# Patient Record
Sex: Male | Born: 1950 | Race: White | Hispanic: No | Marital: Single | Smoking: Former smoker
Health system: Southern US, Community
[De-identification: ages and names within clinical notes are randomized; demographics above are authoritative.]

## PROBLEM LIST (undated history)

## (undated) DIAGNOSIS — I2510 Atherosclerotic heart disease of native coronary artery without angina pectoris: Secondary | ICD-10-CM

## (undated) DIAGNOSIS — E669 Obesity, unspecified: Secondary | ICD-10-CM

## (undated) DIAGNOSIS — M1990 Unspecified osteoarthritis, unspecified site: Secondary | ICD-10-CM

## (undated) DIAGNOSIS — E785 Hyperlipidemia, unspecified: Secondary | ICD-10-CM

## (undated) DIAGNOSIS — G4730 Sleep apnea, unspecified: Secondary | ICD-10-CM

## (undated) DIAGNOSIS — R7303 Prediabetes: Secondary | ICD-10-CM

## (undated) DIAGNOSIS — M109 Gout, unspecified: Secondary | ICD-10-CM

## (undated) DIAGNOSIS — N200 Calculus of kidney: Secondary | ICD-10-CM

## (undated) DIAGNOSIS — R079 Chest pain, unspecified: Secondary | ICD-10-CM

## (undated) DIAGNOSIS — I10 Essential (primary) hypertension: Secondary | ICD-10-CM

## (undated) DIAGNOSIS — M5412 Radiculopathy, cervical region: Secondary | ICD-10-CM

## (undated) DIAGNOSIS — F32A Depression, unspecified: Secondary | ICD-10-CM

## (undated) DIAGNOSIS — K219 Gastro-esophageal reflux disease without esophagitis: Secondary | ICD-10-CM

## (undated) DIAGNOSIS — R42 Dizziness and giddiness: Secondary | ICD-10-CM

## (undated) DIAGNOSIS — F411 Generalized anxiety disorder: Secondary | ICD-10-CM

## (undated) DIAGNOSIS — D239 Other benign neoplasm of skin, unspecified: Secondary | ICD-10-CM

## (undated) DIAGNOSIS — I219 Acute myocardial infarction, unspecified: Secondary | ICD-10-CM

## (undated) DIAGNOSIS — F329 Major depressive disorder, single episode, unspecified: Secondary | ICD-10-CM

## (undated) DIAGNOSIS — IMO0001 Reserved for inherently not codable concepts without codable children: Secondary | ICD-10-CM

## (undated) HISTORY — DX: Prediabetes: R73.03

## (undated) HISTORY — DX: Gastro-esophageal reflux disease without esophagitis: K21.9

## (undated) HISTORY — DX: Gout, unspecified: M10.9

## (undated) HISTORY — DX: Depression, unspecified: F32.A

## (undated) HISTORY — PX: UPPER GI ENDOSCOPY: SHX6162

## (undated) HISTORY — DX: Chest pain, unspecified: R07.9

## (undated) HISTORY — PX: TONSILLECTOMY: SUR1361

## (undated) HISTORY — PX: CORONARY STENT PLACEMENT: SHX1402

## (undated) HISTORY — DX: Calculus of kidney: N20.0

## (undated) HISTORY — DX: Other benign neoplasm of skin, unspecified: D23.9

## (undated) HISTORY — DX: Hyperlipidemia, unspecified: E78.5

## (undated) HISTORY — PX: APPENDECTOMY: SHX54

## (undated) HISTORY — PX: EYE SURGERY: SHX253

## (undated) HISTORY — DX: Generalized anxiety disorder: F41.1

## (undated) HISTORY — DX: Radiculopathy, cervical region: M54.12

## (undated) HISTORY — DX: Obesity, unspecified: E66.9

## (undated) HISTORY — DX: Reserved for inherently not codable concepts without codable children: IMO0001

## (undated) HISTORY — PX: ANTERIOR CRUCIATE LIGAMENT REPAIR: SHX115

## (undated) HISTORY — DX: Atherosclerotic heart disease of native coronary artery without angina pectoris: I25.10

## (undated) HISTORY — PX: CHOLECYSTECTOMY: SHX55

## (undated) HISTORY — DX: Acute myocardial infarction, unspecified: I21.9

## (undated) HISTORY — DX: Major depressive disorder, single episode, unspecified: F32.9

---

## 2004-12-04 ENCOUNTER — Ambulatory Visit: Payer: Self-pay | Admitting: Unknown Physician Specialty

## 2005-10-16 ENCOUNTER — Ambulatory Visit: Payer: Self-pay | Admitting: General Practice

## 2006-07-11 ENCOUNTER — Ambulatory Visit: Payer: Self-pay | Admitting: Gastroenterology

## 2006-11-14 ENCOUNTER — Ambulatory Visit (HOSPITAL_BASED_OUTPATIENT_CLINIC_OR_DEPARTMENT_OTHER): Admission: RE | Admit: 2006-11-14 | Discharge: 2006-11-14 | Payer: Self-pay | Admitting: Orthopedic Surgery

## 2007-04-11 ENCOUNTER — Other Ambulatory Visit: Payer: Self-pay

## 2007-04-11 ENCOUNTER — Emergency Department: Payer: Self-pay | Admitting: Emergency Medicine

## 2007-04-28 ENCOUNTER — Ambulatory Visit: Payer: Self-pay | Admitting: Cardiology

## 2007-05-03 ENCOUNTER — Ambulatory Visit: Payer: Self-pay

## 2007-05-03 LAB — CONVERTED CEMR LAB
Cholesterol: 187 mg/dL (ref 0–200)
Direct LDL: 127 mg/dL
HDL: 37.1 mg/dL — ABNORMAL LOW (ref >39.0)
Total CHOL/HDL Ratio: 5
Triglycerides: 202 mg/dL (ref 0–149)
VLDL: 40 mg/dL (ref 0–40)

## 2009-06-19 DIAGNOSIS — R079 Chest pain, unspecified: Secondary | ICD-10-CM | POA: Insufficient documentation

## 2011-03-02 NOTE — Assessment & Plan Note (Signed)
[facility] OFFICE NOTE   ZI, NEWBURY                      MRN:          [id number]  DATE:04/28/2007                            DOB:          1950-11-29    PRIMARY CARE PHYSICIAN:  Dr. Julieanne Manson.   REASON FOR PRESENTATION:  Evaluate patient with chest discomfort.   HISTORY OF PRESENT ILLNESS:  Patient is a pleasant 60 year old gentleman  without prior cardiac history.  He said that some years ago, he did have  a stress test and describes perhaps a nuclear study.  He had no apparent  coronary disease; however, he has a significant family history with his  father dying in his 57s suddenly of an acute myocardial infarction.  He  has had chest discomfort.  This gentleman has quite pressured speech and  appears anxious.  He has a difficult time slowing down to quantify and  qualify the discomfort.  He describes chest discomfort with burping.  He  says it is a constant problem when he sits down.  He will feel the  discomfort in his chest, and then he has to burp.  This has been going  on for several weeks.  He has also had some left shoulder discomfort.  This has been the last couple of days.  It has been when he wakes up in  the morning.  This morning, he even noticed his arm to be numb when he  woke up.  He says he walks a mile a day.  He cannot bring on any of his  symptoms with this.  He does not report any shortness of breath unless  he walks fairly vigorously.  He does not have any resting shortness of  breath.  He has no PND or orthopnea.  With this chest discomfort, he  does not describe radiation to his jaw.  It goes away after he burps.  He cannot bring it on.  He has not had this before.  There is no  associated nausea, vomiting or diaphoresis.  He is not noticing any  palpitations.  He has had no presyncope or syncope.  He did go to the  [facility] emergency room on June 25 but was not found to  have any  objective evidence of ischemia.   PAST MEDICAL HISTORY:  He has no history of hypertension, diabetes, or  hyperlipidemia.  He does have a history of reflux.   PAST SURGICAL HISTORY:  Cholecystectomy, appendectomy, tonsillectomy,  ACL knee surgery.   ALLERGIES:  None.   MEDICATIONS:  1. AcipHex.  2. Aspirin 162 mg daily.  3. Centrum.   SOCIAL HISTORY:  Patient is divorced.  He has a long-time girlfriend.  He has a son.  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  He  quit smoking in 2004 after about a pack a day for 15 years.  He still  dips tobacco.   FAMILY HISTORY:  As mentioned, with his father.  There are no other  first-degree relatives with early-onset heart disease.   REVIEW OF SYSTEMS:  As stated in the HPI and negative for other systems.   PHYSICAL EXAMINATION:  GENERAL:  The patient appears somewhat anxious  and high-pressured but in no distress.  VITAL SIGNS:  Blood pressure 132/82, heart rate 74 and regular.  Weight  192 pounds.  HEENT:  Eyelids unremarkable.  Pupils are equal, round and reactive to  light.  Fundi not visualized.  Oral mucosa unremarkable.  NECK:  No jugular venous distention.  Wave form within normal limits.  Carotid upstroke brisk and symmetric.  No bruits, no thyromegaly.  LYMPHATICS:  No cervical, axillary, or inguinal adenopathy.  LUNGS:  Clear to auscultation bilaterally.  BACK:  No costovertebral angle tenderness.  CHEST:  Unremarkable.  HEART:  PMI not displaced or sustained.  S1 and S2 within normal limits.  No S3, no S4, no clicks, rubs, or murmurs.  ABDOMEN:  Obese, positive bowel sounds.  Normal in frequency and pitch.  No bruits, rebound, guarding.  There are no midline pulsatile masses.  No hepatomegaly, splenomegaly.  SKIN:  No rashes, no nodules.  EXTREMITIES:  Pulses 2+ throughout.  No cyanosis, no clubbing, no edema.  NEUROLOGIC:  Oriented to person, place, and time.  Cranial nerves II-XII  grossly  intact.  Motor grossly intact.   EKG:  ([facility]).  Sinus rhythm, rate 91, axis leftward, intervals  within normal limits, no acute ST-T wave changes.   ASSESSMENT/PLAN:  1. Chest discomfort:  The patient's chest discomfort is atypical;      however, he does have a very strong family history with his father      dying at a young age suddenly of coronary disease.  Given this and      his somewhat atypical symptoms, I still think the pretest      probability of obstructive coronary disease is low moderate.  I      think he would benefit from a stress perfusion study to rule out      obstructive disease.  However, if this is normal, I told him to      assume that he has some nonobstructive plaque, given his risk      factors and lifestyle.  We are going to work very aggressively on      risk reduction.  2. Obesity:  I discussed with him the need to exercise with diet and      weight loss.  He will embark on this once he is cleared from his      stress test.  3. Risk reduction:  He needs a lipid profile.  Given his family      history, I would work outside the guidelines and suggest an LDL      less than 100 and HDL in the 40s and have a low threshold for a      statin.  4. Tobacco:  I did counsel him about the need to stop dipping tobacco.      He could use nicotine patches if he is cleared from his stress      test.  5. Followup:  On an as-needed basis on the results as above.  We will      review with him the results of his cholesterol.     Rollene Rotunda, MD, [facility]  Electronically Signed    JH/MedQ  DD: 04/28/2007  DT: 04/29/2007  Job #: [id number]   cc:   Julieanne Manson

## 2011-03-05 NOTE — Op Note (Signed)
NAME:  Michael Leon, Michael Leon               ACCOUNT NO.:  [id number]   MEDICAL RECORD NO.:  [id number]          PATIENT TYPE:  AMB   LOCATION:  DSC                          FACILITY:  [facility]   PHYSICIAN:  Feliberto Gottron. Turner Daniels, M.D.   DATE OF BIRTH:  14-Sep-1951   DATE OF PROCEDURE:  11/14/2006  DATE OF DISCHARGE:                               OPERATIVE REPORT   PREOPERATIVE DIAGNOSIS:  Right knee possible anterior cruciate ligament  tear, chondromalacia versus meniscal tear.   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  The menisci were in excellent  condition as was the rest of the articular cartilage.   PROCEDURE:  Right knee arthroscopic debridement of chondromalacia from  the end of the femur, grade 3 with flap tears.  The anterior 50% of the  anterior cruciate ligament graft was also torn and removed.  The rest of  the graft was intact although somewhat loose.  Preoperatively the  patient elected not to have any sort of an anterior cruciate ligament  reconstruction and the laxity of the graft at the time of surgery was  probably between 3 and 4 mm.   PROCEDURE:  Debridement of anterior cruciate ligament tear, debridement  of chondromalacia from the medial femoral condyle.   SURGEON:  Feliberto Gottron. Turner Daniels, M.D.   FIRST ASSISTANT:  Skip Mayer, [facility].   ANESTHETIC:  General LMA.   ESTIMATED BLOOD LOSS:  Minimal.   FLUID REPLACEMENT:  800 mL crystalloid.   DRAINS PLACED:  None.   TOURNIQUET TIME:  None.   INDICATIONS FOR PROCEDURE:  60 year old man with catching, popping and  pain in this knee.  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  Because of the metal fixation used for the ACL graft,  an MRI scan was not able to be accomplished.  He has mechanical pain,  popping, and catching in his knee as well as some instability.  Lachman's test in the office was about 1+.  Pivot glide is present, but  pivot shift is equivocal.  In any event because of persistent pain,  catching and popping and pain and the nature of his work, he was taken  for arthroscopic evaluation and treatment of his right knee.  He is 60  years old.  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  At this point time he does not want to have an ACL  reconstruction if the graft was completely torn.  Risks and benefits of  surgery discussed, questions answered.   DESCRIPTION OF PROCEDURE:  The patient identified by armband, taken to  the operating room at [facility] day surgery center.  Appropriate site  monitors were attached and general LMA anesthesia  induced with the  patient in supine position.  Lateral post applied to the table.  The  right lower extremity prepped, draped usual sterile fashion from the  ankle to the midthigh.  Using a #11 blade, standard inferomedial and  inferolateral peripatellar portals made allowing the introduction of the  arthroscope through the inferolateral portal and the outflow through the  inferomedial portal.  Diagnostic arthroscopy revealed some very mild  chondromalacia of the patella that was very lightly debrided,  Grade 3  chondromalacia focal of the trochlea over 4 to 5 mm area was also  debrided as was some larger flap tears along the distal lateral aspect  of the medial femoral condyle.  The menisci were intact.  The ACL graft  was examined and the anterior one half of the graft was avulsed off of  the femur and this was debrided from anterior to posterior back to some  stable posterior fibers comprising about 40% of the graft that were  intact although on Lachman's testing, there was about  3-4 mm of  movement.  The lateral meniscus was intact.  The collateral ligaments  were intact to varus and valgus testing.  The knee was irrigated out  with normal saline solution.  The arthroscopic instruments were removed  and a dressing of Xeroform, 4x4 dressing sponges, Webril and Ace wrap  applied.  The patient was then awakened and taken to recovery room  without difficulty.      Feliberto Gottron. Turner Daniels, M.D.  Electronically Signed     FJR/MEDQ  D:  11/14/2006  T:  11/14/2006  Job:  [id number]

## 2011-03-05 NOTE — Op Note (Signed)
NAME:  Michael Leon, Michael Leon               ACCOUNT NO.:  [id number]   MEDICAL RECORD NO.:  [id number]          PATIENT TYPE:  AMB   LOCATION:  DSC                          FACILITY:  [facility]   PHYSICIAN:  Feliberto Gottron. Turner Daniels, M.D.   DATE OF BIRTH:  March 31, 1951   DATE OF PROCEDURE:  11/14/2006  DATE OF DISCHARGE:                               OPERATIVE REPORT      Feliberto Gottron. Turner Daniels, M.D.     Ovid Curd  D:  11/14/2006  T:  11/14/2006  Job:  [id number]

## 2011-03-26 ENCOUNTER — Encounter: Payer: Self-pay | Admitting: Cardiovascular Disease

## 2012-03-28 ENCOUNTER — Ambulatory Visit: Payer: Self-pay | Admitting: Internal Medicine

## 2012-03-29 LAB — BASIC METABOLIC PANEL
Anion Gap: 11 (ref 7–16)
BUN: 20 mg/dL — ABNORMAL HIGH (ref 7–18)
Calcium, Total: 9.5 mg/dL (ref 8.5–10.1)
Chloride: 102 mmol/L (ref 98–107)
Co2: 24 mmol/L (ref 21–32)
Creatinine: 1.17 mg/dL (ref 0.60–1.30)
EGFR (African American): >60
EGFR (Non-African Amer.): >60
Glucose: 173 mg/dL — ABNORMAL HIGH (ref 65–99)
Osmolality: 281 (ref 275–301)
Potassium: 4.7 mmol/L (ref 3.5–5.1)
Sodium: 137 mmol/L (ref 136–145)

## 2012-03-29 LAB — CK TOTAL AND CKMB (NOT AT ARMC)
CK, Total: 109 U/L (ref 35–232)
CK-MB: 0.9 ng/mL (ref 0.5–3.6)

## 2012-12-13 ENCOUNTER — Ambulatory Visit: Payer: Self-pay | Admitting: Family Medicine

## 2013-02-20 LAB — HM HEPATITIS C SCREENING LAB: HM Hepatitis Screen: NEGATIVE

## 2013-09-27 ENCOUNTER — Ambulatory Visit: Payer: Self-pay | Admitting: Family Medicine

## 2013-12-25 LAB — HM COLONOSCOPY

## 2014-04-24 LAB — HEPATIC FUNCTION PANEL: Bilirubin, Total: 0.7 mg/dL

## 2014-04-24 LAB — PSA: PSA: 1.3

## 2014-04-24 LAB — LIPID PANEL
Cholesterol: 216 mg/dL — AB (ref 0–200)
HDL: 41 mg/dL (ref 35–70)
LDL Cholesterol: 123 mg/dL
LDl/HDL Ratio: 3
Triglycerides: 261 mg/dL — AB (ref 40–160)

## 2014-04-24 LAB — BASIC METABOLIC PANEL
BUN: 12 mg/dL (ref 4–21)
Creatinine: 1.1 mg/dL (ref 0.6–1.3)
Glucose: 113 mg/dL
Sodium: 143 mmol/L (ref 137–147)

## 2014-04-24 LAB — TSH: TSH: 0.71 u[IU]/mL (ref 0.41–5.90)

## 2014-10-18 DIAGNOSIS — I213 ST elevation (STEMI) myocardial infarction of unspecified site: Secondary | ICD-10-CM

## 2014-10-18 HISTORY — DX: ST elevation (STEMI) myocardial infarction of unspecified site: I21.3

## 2014-10-18 HISTORY — PX: CARDIAC CATHETERIZATION: SHX172

## 2014-10-22 LAB — HEMOGLOBIN A1C: Hgb A1c MFr Bld: 5.9 % (ref 4.0–6.0)

## 2015-02-09 NOTE — Discharge Summary (Signed)
PATIENT NAME:  Michael Leon, TALLERICO MR#:  [id number] DATE OF BIRTH:  [birth year]  DATE OF ADMISSION:  03/28/2012 DATE OF DISCHARGE:  03/29/2012  PRIMARY CARE PHYSICIAN: Dr. Miguel Aschoff   DISCHARGE DIAGNOSES:  1. Stable angina.  2. Coronary artery disease.  3. Hypertension.  4. Hyperlipidemia.   PROCEDURES: Patient underwent a cardiac catheterization showing moderate three vessel coronary atherosclerosis with critical diagonal artery atherosclerosis of 95% therefore underwent a PCI and stent placement using a drug-eluting stent without complication.   HISTORY: This 64 year old male with hypertension, hyperlipidemia, known coronary artery disease having progressive episodes of chest discomfort and true angina needing further evaluation. Patient underwent cardiac catheterization showing results as above therefore PCI and stent placement was performed with a drug-eluting stent and the patient did fairly well without evidence of further chest discomfort. The patient was placed on appropriate medications and had no other further symptoms or complications. The patient had an EKG showing normal sinus rhythm, normal EKG.   DISCHARGE MEDICATIONS:  1. Zantac 150 mg a day. 2. Pravastatin 40 mg p.o. daily.  3. Losartan 50 mg p.o. daily.  4. Aspirin 81 mg each day. 5. Plavix 75 mg p.o. daily.   FOLLOW UP: He is to follow up in two weeks and have further adjustments of medications.  ____________________________ Corey Skains, MD bjk:cms D: 03/29/2012 08:19:00 ET T: 03/29/2012 11:09:25 ET  JOB#: [id number] Corey Skains MD ELECTRONICALLY SIGNED 03/30/2012 7:49

## 2015-04-29 ENCOUNTER — Encounter: Payer: Self-pay | Admitting: Emergency Medicine

## 2015-04-29 ENCOUNTER — Other Ambulatory Visit: Payer: Self-pay

## 2015-04-29 ENCOUNTER — Emergency Department
Admission: EM | Admit: 2015-04-29 | Discharge: 2015-04-29 | Payer: Managed Care, Other (non HMO) | Attending: Emergency Medicine | Admitting: Emergency Medicine

## 2015-04-29 ENCOUNTER — Emergency Department: Payer: Managed Care, Other (non HMO)

## 2015-04-29 DIAGNOSIS — R110 Nausea: Secondary | ICD-10-CM | POA: Insufficient documentation

## 2015-04-29 DIAGNOSIS — I2119 ST elevation (STEMI) myocardial infarction involving other coronary artery of inferior wall: Secondary | ICD-10-CM | POA: Diagnosis not present

## 2015-04-29 DIAGNOSIS — I10 Essential (primary) hypertension: Secondary | ICD-10-CM | POA: Diagnosis not present

## 2015-04-29 DIAGNOSIS — R079 Chest pain, unspecified: Secondary | ICD-10-CM | POA: Diagnosis present

## 2015-04-29 DIAGNOSIS — Z87891 Personal history of nicotine dependence: Secondary | ICD-10-CM | POA: Diagnosis not present

## 2015-04-29 DIAGNOSIS — R61 Generalized hyperhidrosis: Secondary | ICD-10-CM | POA: Insufficient documentation

## 2015-04-29 LAB — CBC
HCT: 50.2 % (ref 40.0–52.0)
Hemoglobin: 17.1 g/dL (ref 13.0–18.0)
MCH: 32.2 pg (ref 26.0–34.0)
MCHC: 34.1 g/dL (ref 32.0–36.0)
MCV: 94.4 fL (ref 80.0–100.0)
Platelets: 232 10*3/uL (ref 150–440)
RBC: 5.32 MIL/uL (ref 4.40–5.90)
RDW: 14.5 % (ref 11.5–14.5)
WBC: 12.6 10*3/uL — ABNORMAL HIGH (ref 3.8–10.6)

## 2015-04-29 LAB — APTT: aPTT: 28 seconds (ref 24–36)

## 2015-04-29 LAB — PROTIME-INR
INR: 0.91
Prothrombin Time: 12.5 seconds (ref 11.4–15.0)

## 2015-04-29 MED ORDER — HEPARIN (PORCINE) IN NACL 100-0.45 UNIT/ML-% IJ SOLN
1100.0000 [IU]/h | Freq: Once | INTRAMUSCULAR | Status: DC
  Filled 2015-04-29: qty 250

## 2015-04-29 MED ORDER — SODIUM CHLORIDE 0.9 % IV SOLN
INTRAVENOUS | Status: DC
  Administered 2015-04-29: 20:00:00 via INTRAVENOUS

## 2015-04-29 MED ORDER — HEPARIN SODIUM (PORCINE) 5000 UNIT/ML IJ SOLN
60.0000 [IU]/kg | INTRAMUSCULAR | Status: AC
  Administered 2015-04-29: 5450 [IU] via INTRAVENOUS

## 2015-04-29 MED ORDER — CLOPIDOGREL BISULFATE 75 MG PO TABS
600.0000 mg | ORAL_TABLET | Freq: Once | ORAL | Status: AC
  Administered 2015-04-29: 600 mg via ORAL

## 2015-04-29 NOTE — ED Provider Notes (Signed)
[facility] Emergency Department Provider Note  Time seen: 8:16 PM  I have reviewed the triage vital signs and the nursing notes.   HISTORY  Chief Complaint Chest Pain and Jaw Pain    HPI Michael Leon is a 64 y.o. male with a past medical history of hypertension, hyperlipidemia, coronary stent 4 years ago, presents the emergency department with approximately one hour of chest pain radiating to the jaw. According to the patient he was weed eating his lawn for approximately one hour, began having central chest pain radiating to his jaw. Rates the pain as a 4/10 at maximum. Became somewhat nauseated and diaphoretic so he drove himself to the emergency department after taking 2 full strength aspirin at home. Upon arrival to the emergency department an EKG was done which showed a likely inferolateral STEMI. Patient states his chest pain is currently a 1/10, remains pale and diaphoretic. Describes the pain as a pressure sensation in his central chest radiating to bilateral jaws.     Past Medical History  Diagnosis Date  . Chest pain, unspecified   . Reflux     Patient Active Problem List   Diagnosis Date Noted  . CHEST PAIN UNSPECIFIED 06/19/2009    Past Surgical History  Procedure Laterality Date  . Cholecystectomy    . Appendectomy    . Tonsillectomy    . Anterior cruciate ligament repair    . Coronary stent placement      No current outpatient prescriptions on file.  Allergies Review of patient's allergies indicates no known allergies.  Family History  Problem Relation Age of Onset  . Cancer Mother   . Heart attack Father     Social History History  Substance Use Topics  . Smoking status: Former Smoker -- 15 years    Quit date: 10/18/2002  . Smokeless tobacco: Not on file  . Alcohol Use: 3.5 oz/week    7 Standard drinks or equivalent per week     Comment: 1 beer a day    Review of Systems Constitutional: Negative for  fever. Cardiovascular: Positive for chest pain radiating to the jaw. Respiratory: Negative for shortness of breath. Gastrointestinal: Negative for abdominal pain, vomiting and diarrhea. Positive for nausea. Musculoskeletal: Negative for back pain. Skin: Positive for pallor and diaphoresis.  10-point ROS otherwise negative.  ____________________________________________   PHYSICAL EXAM:  VITAL SIGNS: ED Triage Vitals  Enc Vitals Group     BP 04/29/15 2004 179/94 mmHg     Pulse Rate 04/29/15 2004 98     Resp 04/29/15 2004 11     Temp 04/29/15 2004 98 F (36.7 C)     Temp Source 04/29/15 2004 Oral     SpO2 04/29/15 2004 100 %     Weight 04/29/15 2004 200 lb (90.719 kg)     Height 04/29/15 2004 5\' 4"  (1.626 m)     Head Cir --      Peak Flow --      Pain Score 04/29/15 2004 1     Pain Loc --      Pain Edu? --      Excl. in [place]? --     Constitutional: Alert and oriented. No distress. Eyes: Normal exam ENT   Mouth/Throat: Mucous membranes are moist. Cardiovascular: Normal rate, regular rhythm. No murmur Respiratory: Normal respiratory effort without tachypnea nor retractions. Breath sounds are clear and equal bilaterally. No wheezes/rales/rhonchi. Gastrointestinal: Soft and nontender. No distention.   Musculoskeletal: Nontender with normal range of  motion in all extremities. No lower extremity tenderness or edema. Neurologic:  Normal speech and language. No gross focal neurologic deficits  Skin:  Skin is somewhat pale and diaphoretic. Psychiatric: Mood and affect are normal. Speech and behavior are normal. Patient exhibits appropriate insight and judgment.  ____________________________________________    EKG  EKG reviewed and interpreted by myself shows normal sinus rhythm at 89 bpm, narrow QRS, left axis deviation, ST elevation present in 2, 3, aVF, V4 through V6. Most consistent with an inferolateral STEMI.  ____________________________________________     RADIOLOGY  Chest x-ray within normal limits  ____________________________________________   INITIAL IMPRESSION / ASSESSMENT AND PLAN / ED COURSE  Pertinent labs & imaging results that were available during my care of the patient were reviewed by me and considered in my medical decision making (see chart for details).  Patient presents with chest pain radiating to the jaw, and an EKG consistent with an acute inferolateral STEMI. Patient took 2 full strength aspirin prior to arrival. We will start the patient on 600 mg of Plavix, heparin bolus and drip. I discussed with [facility] who has accepted the patient directly to the catheter lab. Due to bad weather their helicopter as well as [facility]'s helicopter are not currently flying. We have attempted to arrange transport from local EMS however they are not able to transport with a heparin drip. [facility] we'll be sending their truck from [place] to pick the patient up and bring to [facility]. Patient with no distress at this time, currently states 1/10 chest pain. Vitals otherwise stable.   CRITICAL CARE Performed by: Harvest Dark   Total critical care time: 45 minutes  Critical care time was exclusive of separately billable procedures and treating other patients.  Critical care was necessary to treat or prevent imminent or life-threatening deterioration.  Critical care was time spent personally by me on the following activities: development of treatment plan with patient and/or surrogate as well as nursing, discussions with consultants, evaluation of patient's response to treatment, examination of patient, obtaining history from patient or surrogate, ordering and performing treatments and interventions, ordering and review of laboratory studies, ordering and review of radiographic studies, pulse oximetry and re-evaluation of patient's condition.  Patient reevaluated just prior to transfer, remained hemodynamically stable, well appearing with a 1/10 chest  discomfort.   ____________________________________________   FINAL CLINICAL IMPRESSION(S) / ED DIAGNOSES  Inferolateral STEMI   Harvest Dark, MD 04/30/15 2033

## 2015-04-29 NOTE — ED Notes (Signed)
Pt presents to ED with midsternal chest pain that radiates to his jaw. Cardiac hx of stent placement. Diaphoretic.

## 2015-04-29 NOTE — ED Notes (Signed)
[facility] states [facility] not using heparin drip for STEMI so heparin drip not started

## 2015-04-30 DIAGNOSIS — F419 Anxiety disorder, unspecified: Secondary | ICD-10-CM | POA: Insufficient documentation

## 2015-04-30 DIAGNOSIS — I213 ST elevation (STEMI) myocardial infarction of unspecified site: Secondary | ICD-10-CM | POA: Insufficient documentation

## 2015-04-30 DIAGNOSIS — R739 Hyperglycemia, unspecified: Secondary | ICD-10-CM | POA: Insufficient documentation

## 2015-04-30 DIAGNOSIS — R7303 Prediabetes: Secondary | ICD-10-CM | POA: Insufficient documentation

## 2015-05-01 LAB — COMPREHENSIVE METABOLIC PANEL
ALT: 39 U/L (ref 17–63)
AST: 40 U/L (ref 15–41)
Albumin: 4.5 g/dL (ref 3.5–5.0)
Alkaline Phosphatase: 51 U/L (ref 38–126)
Anion gap: 14 (ref 5–15)
BUN: 13 mg/dL (ref 6–20)
CO2: 23 mmol/L (ref 22–32)
Calcium: 10.4 mg/dL — ABNORMAL HIGH (ref 8.9–10.3)
Chloride: 100 mmol/L — ABNORMAL LOW (ref 101–111)
Creatinine, Ser: 1.16 mg/dL (ref 0.61–1.24)
GFR calc Af Amer: >60 mL/min (ref >60)
GFR calc non Af Amer: >60 mL/min (ref >60)
Glucose, Bld: 107 mg/dL — ABNORMAL HIGH (ref 65–99)
Potassium: 3.3 mmol/L — ABNORMAL LOW (ref 3.5–5.1)
Sodium: 137 mmol/L (ref 135–145)
Total Bilirubin: 0.6 mg/dL (ref 0.3–1.2)
Total Protein: 7.8 g/dL (ref 6.5–8.1)

## 2015-05-01 LAB — TROPONIN I: Troponin I: 0.03 ng/mL (ref <0.031)

## 2015-05-05 DIAGNOSIS — I10 Essential (primary) hypertension: Secondary | ICD-10-CM | POA: Insufficient documentation

## 2015-05-06 DIAGNOSIS — G4700 Insomnia, unspecified: Secondary | ICD-10-CM

## 2015-05-06 DIAGNOSIS — F329 Major depressive disorder, single episode, unspecified: Secondary | ICD-10-CM

## 2015-05-06 DIAGNOSIS — E669 Obesity, unspecified: Secondary | ICD-10-CM

## 2015-05-06 DIAGNOSIS — M5412 Radiculopathy, cervical region: Secondary | ICD-10-CM

## 2015-05-06 DIAGNOSIS — F411 Generalized anxiety disorder: Secondary | ICD-10-CM

## 2015-05-06 DIAGNOSIS — R7303 Prediabetes: Secondary | ICD-10-CM

## 2015-05-06 DIAGNOSIS — F32A Depression, unspecified: Secondary | ICD-10-CM

## 2015-05-06 DIAGNOSIS — Z87442 Personal history of urinary calculi: Secondary | ICD-10-CM

## 2015-05-06 DIAGNOSIS — Z720 Tobacco use: Secondary | ICD-10-CM

## 2015-05-07 DIAGNOSIS — I10 Essential (primary) hypertension: Secondary | ICD-10-CM | POA: Insufficient documentation

## 2015-05-07 DIAGNOSIS — I2510 Atherosclerotic heart disease of native coronary artery without angina pectoris: Secondary | ICD-10-CM | POA: Insufficient documentation

## 2015-05-07 DIAGNOSIS — Z87442 Personal history of urinary calculi: Secondary | ICD-10-CM | POA: Insufficient documentation

## 2015-05-07 DIAGNOSIS — G4700 Insomnia, unspecified: Secondary | ICD-10-CM | POA: Insufficient documentation

## 2015-05-07 DIAGNOSIS — E669 Obesity, unspecified: Secondary | ICD-10-CM | POA: Insufficient documentation

## 2015-05-07 DIAGNOSIS — F32A Depression, unspecified: Secondary | ICD-10-CM | POA: Insufficient documentation

## 2015-05-07 DIAGNOSIS — M109 Gout, unspecified: Secondary | ICD-10-CM | POA: Insufficient documentation

## 2015-05-07 DIAGNOSIS — M5412 Radiculopathy, cervical region: Secondary | ICD-10-CM | POA: Insufficient documentation

## 2015-05-07 DIAGNOSIS — E785 Hyperlipidemia, unspecified: Secondary | ICD-10-CM | POA: Insufficient documentation

## 2015-05-07 DIAGNOSIS — F411 Generalized anxiety disorder: Secondary | ICD-10-CM | POA: Insufficient documentation

## 2015-05-07 DIAGNOSIS — R7303 Prediabetes: Secondary | ICD-10-CM | POA: Insufficient documentation

## 2015-05-07 DIAGNOSIS — Z720 Tobacco use: Secondary | ICD-10-CM | POA: Insufficient documentation

## 2015-05-07 DIAGNOSIS — K219 Gastro-esophageal reflux disease without esophagitis: Secondary | ICD-10-CM | POA: Insufficient documentation

## 2015-05-07 DIAGNOSIS — F329 Major depressive disorder, single episode, unspecified: Secondary | ICD-10-CM | POA: Insufficient documentation

## 2015-05-08 ENCOUNTER — Ambulatory Visit (INDEPENDENT_AMBULATORY_CARE_PROVIDER_SITE_OTHER): Payer: Managed Care, Other (non HMO) | Admitting: Family Medicine

## 2015-05-08 ENCOUNTER — Encounter: Payer: Self-pay | Admitting: Family Medicine

## 2015-05-08 VITALS — BP 112/70 | HR 72 | Temp 98.2°F | Resp 16 | Wt 201.0 lb

## 2015-05-08 DIAGNOSIS — I519 Heart disease, unspecified: Secondary | ICD-10-CM | POA: Diagnosis not present

## 2015-05-08 DIAGNOSIS — Z09 Encounter for follow-up examination after completed treatment for conditions other than malignant neoplasm: Secondary | ICD-10-CM

## 2015-05-08 DIAGNOSIS — E785 Hyperlipidemia, unspecified: Secondary | ICD-10-CM

## 2015-05-08 DIAGNOSIS — R42 Dizziness and giddiness: Secondary | ICD-10-CM | POA: Insufficient documentation

## 2015-05-08 NOTE — Progress Notes (Signed)
Patient ID: Michael Leon, male   DOB: 1951-07-20, 64 y.o.   MRN: [id number]   Michael Leon  MRN: [id number] DOB: 1951/02/04  Subjective:  HPI   1. Hospital discharge follow-up Patient is a 64 year old male who presents for follow up of hospitalization after having an MI>  Patient was seen in the [facility] ED on 04/29/15 and then transported to [place].  We have not received any records from [facility] and the information provided today comes from the patient.  Patient states he saw Dr. Nehemiah Massed this morning.  He has been having dizziness since the time of the MI.  Dr. Nehemiah Massed felt it was not his heart but due to the Metoprolol.  Patient had been taking 50 mg daily and now he is to take 1/2 of the tablet.   Patient states that prior to his hospitalization he had been having fatigue for about 1 month.  The day of his event he had been mowing and when he finished he was hot and thirsty.  He drank 1 beer and drank it rather fast and then had some discomfort across his chest.  He went to take Aspirin but did not have any and drove to the pharmacy.  He took 3 Aspirin and did not feel any better and took himself to the ER.  He was seen immediately and as soon as they finished doing his EKG his pain subsided.  From the ER he was sent to [facility] for cardiac cath.  Once at [facility] his EKG had improved and it was decided that he could wait until the next day for the cath.  Cath reported to be normal with all vessels clear and stent ( from placement about 5 years ago) was in place with no issues.  Patient was admitted on 04/29/15 and discharged 05/02/15.  Patient reports to be feeling better, more rested and no problems other than some slight SOB with exertion and dizziness when getting up.  Patient Active Problem List   Diagnosis Date Noted  . Arteriosclerosis of coronary artery 05/07/2015  . HLD (hyperlipidemia) 05/07/2015  . BP (high blood pressure) 05/07/2015  . Obesity 05/07/2015  . Depression 05/07/2015  . GAD  (generalized anxiety disorder) 05/07/2015  . Insomnia 05/07/2015  . GERD (gastroesophageal reflux disease) 05/07/2015  . Gout 05/07/2015  . Cervical radiculopathy 05/07/2015  . Prediabetes 05/07/2015  . Tobacco use 05/07/2015  . History of kidney stones 05/07/2015  . CHEST PAIN UNSPECIFIED 06/19/2009    Past Medical History  Diagnosis Date  . Chest pain, unspecified   . Reflux   . GERD (gastroesophageal reflux disease)   . Depression   . ASCVD (arteriosclerotic cardiovascular disease)   . Pre-diabetes   . Cervical radiculopathy   . GAD (generalized anxiety disorder)   . Gout   . Obesity     History   Social History  . Marital Status: Single    Spouse Name: N/A  . Number of Children: N/A  . Years of Education: N/A   Occupational History  . Retired Agricultural consultant    Social History Main Topics  . Smoking status: Former Smoker -- 15 years    Quit date: 10/18/2002  . Smokeless tobacco: Not on file  . Alcohol Use: 3.5 oz/week    7 Standard drinks or equivalent per week     Comment: 1 beer a day  . Drug Use: No  . Sexual Activity: Not on file   Other Topics Concern  . Not on  file   Social History Narrative   Gets regular exercise - walking    Outpatient Prescriptions Prior to Visit  Medication Sig Dispense Refill  . losartan (COZAAR) 50 MG tablet Take by mouth.    Marland Kitchen PARoxetine (PAXIL) 40 MG tablet TK 1 T PO QD  6  . clopidogrel (PLAVIX) 75 MG tablet Take by mouth.    . pantoprazole (PROTONIX) 40 MG tablet Take by mouth.    . pravastatin (PRAVACHOL) 80 MG tablet Take by mouth.    . ranitidine (ZANTAC) 300 MG tablet Take by mouth.     No facility-administered medications prior to visit.    Allergies  Allergen Reactions  . Codeine Other (See Comments)    Review of Systems  Constitutional: Negative.   Respiratory: Negative.   Cardiovascular: Positive for chest pain. Negative for palpitations, orthopnea, claudication and leg swelling.  Neurological: Positive for  dizziness. Negative for headaches.  Psychiatric/Behavioral: Negative.    Objective:  BP 112/70 mmHg  Pulse 72  Temp(Src) 98.2 F (36.8 C) (Oral)  Resp 16  Wt 201 lb (91.173 kg)  Physical Exam  Constitutional: He is oriented to person, place, and time and well-developed, well-nourished, and in no distress.  HENT:  Head: Normocephalic and atraumatic.  Right Ear: External ear normal.  Left Ear: External ear normal.  Nose: Nose normal.  Eyes: Conjunctivae are normal.  Neck: Normal range of motion. Neck supple.  Cardiovascular: Normal rate, regular rhythm and normal heart sounds.   Pulmonary/Chest: Effort normal.  Abdominal: Soft. Bowel sounds are normal.  Neurological: He is alert and oriented to person, place, and time. Gait normal.  Skin: Skin is warm and dry.  Psychiatric: Memory, affect and judgment normal.    Assessment and Plan :  Hospital discharge follow-up Recent MI with angioplasty of the RCA. Stent in place. We'll aggressively treat all risk factors. Follow-up 1 month. Hyperlipidemia Hypertension Obesity [facility] 05/08/2015 3:09 PM

## 2015-05-11 ENCOUNTER — Encounter: Payer: Self-pay | Admitting: Family Medicine

## 2015-05-16 LAB — CMP14+EGFR
ALT: 25 IU/L (ref 0–44)
AST: 22 IU/L (ref 0–40)
Albumin/Globulin Ratio: 1.7 (ref 1.1–2.5)
Albumin: 4.3 g/dL (ref 3.6–4.8)
Alkaline Phosphatase: 54 IU/L (ref 39–117)
BUN/Creatinine Ratio: 18 (ref 10–22)
BUN: 20 mg/dL (ref 8–27)
Bilirubin Total: 0.7 mg/dL (ref 0.0–1.2)
CO2: 20 mmol/L (ref 18–29)
Calcium: 9.6 mg/dL (ref 8.6–10.2)
Chloride: 98 mmol/L (ref 97–108)
Creatinine, Ser: 1.14 mg/dL (ref 0.76–1.27)
GFR calc Af Amer: 79 mL/min/{1.73_m2} (ref >59)
GFR calc non Af Amer: 69 mL/min/{1.73_m2} (ref >59)
Globulin, Total: 2.6 g/dL (ref 1.5–4.5)
Glucose: 111 mg/dL — ABNORMAL HIGH (ref 65–99)
Potassium: 4.5 mmol/L (ref 3.5–5.2)
Sodium: 138 mmol/L (ref 134–144)
Total Protein: 6.9 g/dL (ref 6.0–8.5)

## 2015-05-16 LAB — LIPID PANEL WITH LDL/HDL RATIO
Cholesterol, Total: 109 mg/dL (ref 100–199)
HDL: 36 mg/dL — ABNORMAL LOW (ref >39)
LDL Calculated: 44 mg/dL (ref 0–99)
LDl/HDL Ratio: 1.2 ratio units (ref 0.0–3.6)
Triglycerides: 144 mg/dL (ref 0–149)
VLDL Cholesterol Cal: 29 mg/dL (ref 5–40)

## 2015-05-16 LAB — CK: Total CK: 195 U/L (ref 24–204)

## 2015-05-28 ENCOUNTER — Other Ambulatory Visit: Payer: Self-pay | Admitting: Family Medicine

## 2015-06-05 DIAGNOSIS — R0681 Apnea, not elsewhere classified: Secondary | ICD-10-CM | POA: Insufficient documentation

## 2015-07-04 ENCOUNTER — Ambulatory Visit (INDEPENDENT_AMBULATORY_CARE_PROVIDER_SITE_OTHER): Payer: Managed Care, Other (non HMO) | Admitting: Physician Assistant

## 2015-07-04 ENCOUNTER — Ambulatory Visit
Admission: RE | Admit: 2015-07-04 | Discharge: 2015-07-04 | Disposition: A | Discharge status: Home / Self Care | Payer: Managed Care, Other (non HMO) | Source: Ambulatory Visit | Attending: Physician Assistant | Admitting: Physician Assistant

## 2015-07-04 ENCOUNTER — Encounter: Payer: Self-pay | Admitting: Physician Assistant

## 2015-07-04 VITALS — BP 140/80 | HR 72 | Temp 98.1°F | Resp 16 | Wt 202.6 lb

## 2015-07-04 DIAGNOSIS — R042 Hemoptysis: Secondary | ICD-10-CM | POA: Diagnosis not present

## 2015-07-04 DIAGNOSIS — K219 Gastro-esophageal reflux disease without esophagitis: Secondary | ICD-10-CM

## 2015-07-04 LAB — IFOBT (OCCULT BLOOD): IFOBT: NEGATIVE

## 2015-07-04 MED ORDER — PANTOPRAZOLE SODIUM 40 MG PO TBEC: 40.0000 mg | DELAYED_RELEASE_TABLET | Freq: Two times a day (BID) | ORAL | Status: DC

## 2015-07-04 NOTE — Patient Instructions (Signed)
Hemoptysis Hemoptysis, which means coughing up blood, can be a sign of a minor problem or a serious medical condition. The blood that is coughed up may come from the lungs and airways. Coughed-up blood can also come from bleeding that occurs outside the lungs and airways. Blood can drain into the windpipe during a severe nosebleed or when blood is vomited from the stomach. Because hemoptysis can be a sign of something serious, a medical evaluation is required. For some people with hemoptysis, no definite cause is ever identified. CAUSES  The most common cause of hemoptysis is bronchitis. Some other common causes include:   A ruptured blood vessel caused by coughing or an infection.   A medical condition that causes damage to the large air passageways (bronchiectasis).   A blood clot in the lungs (pulmonary embolism).   Pneumonia.   Tuberculosis.   Breathing in a small foreign object.   Cancer. For some people with hemoptysis, no definite cause is ever identified.  HOME CARE INSTRUCTIONS  Only take over-the-counter or prescription medicines as directed by your caregiver. Do not use cough suppressants unless your caregiver approves.  If your caregiver prescribes antibiotic medicines, take them as directed. Finish them even if you start to feel better.  Do not smoke. Also avoid secondhand smoke.  Follow up with your caregiver as directed. SEEK IMMEDIATE MEDICAL CARE IF:   You cough up bloody mucus for longer than a week.  You have a blood-producing cough that is severe or getting worse.  You have a blood-producing cough thatcomes and goes over time.  You develop problems with your breathing.   You vomit blood.  You develop bloody or black-colored stools.  You have chest pain.   You develop night sweats.  You feel faint or pass out.   You have a fever or persistent symptoms for more than 2-3 days.  You have a fever and your symptoms suddenly get worse. MAKE  SURE YOU:  Understand these instructions.  Will watch your condition.  Will get help right away if you are not doing well or get worse. Document Released: 12/13/2001 Document Revised: 09/20/2012 Document Reviewed: 07/21/2012 [facility] Patient Information 2015 [place], [place]. This information is not intended to replace advice given to you by your health care provider. Make sure you discuss any questions you have with your health care provider.  Gastroesophageal Reflux Disease, Adult Gastroesophageal reflux disease (GERD) happens when acid from your stomach flows up into the esophagus. When acid comes in contact with the esophagus, the acid causes soreness (inflammation) in the esophagus. Over time, GERD may create small holes (ulcers) in the lining of the esophagus. CAUSES   Increased body weight. This puts pressure on the stomach, making acid rise from the stomach into the esophagus.  Smoking. This increases acid production in the stomach.  Drinking alcohol. This causes decreased pressure in the lower esophageal sphincter (valve or ring of muscle between the esophagus and stomach), allowing acid from the stomach into the esophagus.  Late evening meals and a full stomach. This increases pressure and acid production in the stomach.  A malformed lower esophageal sphincter. Sometimes, no cause is found. SYMPTOMS   Burning pain in the lower part of the mid-chest behind the breastbone and in the mid-stomach area. This may occur twice a week or more often.  Trouble swallowing.  Sore throat.  Dry cough.  Asthma-like symptoms including chest tightness, shortness of breath, or wheezing. DIAGNOSIS  Your caregiver may be able to diagnose  GERD based on your symptoms. In some cases, X-rays and other tests may be done to check for complications or to check the condition of your stomach and esophagus. TREATMENT  Your caregiver may recommend over-the-counter or prescription medicines to help  decrease acid production. Ask your caregiver before starting or adding any new medicines.  HOME CARE INSTRUCTIONS   Change the factors that you can control. Ask your caregiver for guidance concerning weight loss, quitting smoking, and alcohol consumption.  Avoid foods and drinks that make your symptoms worse, such as:  Caffeine or alcoholic drinks.  Chocolate.  Peppermint or mint flavorings.  Garlic and onions.  Spicy foods.  Citrus fruits, such as oranges, lemons, or limes.  Tomato-based foods such as sauce, chili, salsa, and pizza.  Fried and fatty foods.  Avoid lying down for the 3 hours prior to your bedtime or prior to taking a nap.  Eat small, frequent meals instead of large meals.  Wear loose-fitting clothing. Do not wear anything tight around your waist that causes pressure on your stomach.  Raise the head of your bed 6 to 8 inches with wood blocks to help you sleep. Extra pillows will not help.  Only take over-the-counter or prescription medicines for pain, discomfort, or fever as directed by your caregiver.  Do not take aspirin, ibuprofen, or other nonsteroidal anti-inflammatory drugs (NSAIDs). SEEK IMMEDIATE MEDICAL CARE IF:   You have pain in your arms, neck, jaw, teeth, or back.  Your pain increases or changes in intensity or duration.  You develop nausea, vomiting, or sweating (diaphoresis).  You develop shortness of breath, or you faint.  Your vomit is green, yellow, black, or looks like coffee grounds or blood.  Your stool is red, bloody, or black. These symptoms could be signs of other problems, such as heart disease, gastric bleeding, or esophageal bleeding. MAKE SURE YOU:   Understand these instructions.  Will watch your condition.  Will get help right away if you are not doing well or get worse. Document Released: 07/14/2005 Document Revised: 12/27/2011 Document Reviewed: 04/23/2011 [facility] Patient Information 2015 [place], [place]. This  information is not intended to replace advice given to you by your health care provider. Make sure you discuss any questions you have with your health care provider.

## 2015-07-04 NOTE — Progress Notes (Signed)
Patient: Michael Leon Male    DOB: June 18, 1951   64 y.o.   MRN: [id number] Visit Date: 07/04/2015  Today's Provider: Mar Daring, PA-C   Chief Complaint  Patient presents with  . Spitting up blood   Subjective:    HPI Michael Leon is a 64 year old male that comes in to the office today with a couple occurrences of spitting up blood. He states that it has never been a large amount but is concerned about the cause. This morning he woke up and had a funny taste in his mouth. He spent in the seen and noticed blood-tinged sputum. He then brushes teeth and wash his mouth out and spit again and had more blood. He has not had any other current since this time. He also brought in a photo on his cell phone of his bed where he woke up and had 2 small bloodstains on his sheets and also stated he had one similar about the size of a quarter on his pillowcase. He is a former smoker and is worried about lung cancer. He also drinks 3-4 beers daily. He recently had a NSTEMI and had a stent placed on 04/29/2015. He is currently on aspirin and Brilinta for this. He has been off of his pantoprazole since the heart attack. He has noticed increased indigestion, gas and loose bowel movements. He does state that the bowel movements have been somewhat loose since his cholecystectomy, but it has worsened over the last couple months since his heart attack. He does state that there is improvement when he takes fiber. He denies any melena or hematochezia. He also has complaints of shortness of breath. This has been new since his heart attack as well. He was informed by Dr. Nehemiah Massed that the main side effect of Brilinta is shortness of breath. He has not had increased cough. He does have mild abdominal pain. He also has a history of kidney stones.    Allergies  Allergen Reactions  . Codeine Other (See Comments)   Previous Medications   ASPIRIN 81 MG TABLET    Take 81 mg by mouth daily.   ATORVASTATIN  (LIPITOR) 80 MG TABLET       BRILINTA 90 MG TABS TABLET       LOSARTAN (COZAAR) 50 MG TABLET    Take by mouth.   NITROSTAT 0.4 MG SL TABLET    PLACE 1 T UNDER THE TONGUE Q 5 MINUTES PRF CHEST PAIN UP TO 3 DOSES   PAROXETINE (PAXIL) 40 MG TABLET    TAKE 1 TABLET BY MOUTH EVERY DAY    Review of Systems  Constitutional: Negative.   HENT: Negative.   Eyes: Negative.   Respiratory: Negative.   Cardiovascular: Negative.   Gastrointestinal: Negative.   Genitourinary: Negative.   Allergic/Immunologic: Negative.   Neurological: Negative.   Hematological: Negative.   Psychiatric/Behavioral: Negative.     Social History  Substance Use Topics  . Smoking status: Former Smoker -- 15 years    Quit date: 10/18/2002  . Smokeless tobacco: Not on file  . Alcohol Use: 3.5 oz/week    7 Standard drinks or equivalent per week     Comment: 1 beer a day   Objective:   BP 140/80 mmHg  Pulse 72  Temp(Src) 98.1 F (36.7 C) (Oral)  Resp 16  Wt 202 lb 9.6 oz (91.899 kg)  Physical Exam  Constitutional: He appears well-developed and well-nourished. No distress.  HENT:  Mouth/Throat: Uvula  is midline, oropharynx is clear and moist and mucous membranes are normal. No oropharyngeal exudate.  Cardiovascular: Normal rate, regular rhythm and normal heart sounds.   Pulmonary/Chest: Effort normal and breath sounds normal. No respiratory distress. He has no wheezes. He has no rales.  Abdominal: Soft. Bowel sounds are normal. He exhibits no distension and no mass. There is no tenderness. There is no rebound and no guarding.  Genitourinary: Rectum normal. Rectal exam shows no external hemorrhoid, no internal hemorrhoid, no fissure, no mass, no tenderness and anal tone normal. Guaiac negative stool. Prostate is enlarged (nontender).  Skin: He is not diaphoretic.  Vitals reviewed.       Assessment & Plan:     1. Hemoptysis OC-Lite was negative today in the office. I will get a chest x-ray and check his  CBC. I do feel a lot of his symptoms are most likely correlated with acid reflux and not being on his Protonix. We did discuss other possibilities such as a lung nodule, esophageal varices, and possible gastrointestinal bleed. I did advise him to try to cut back on his drinking. He voiced understanding and stated that he would definitely try because this has scared him and he does not want to have any other issues. If the chest x-ray is normal and CBC is normal we may await to see if restarting the pantoprazole resolves the issue. If there is no improvement or if it occurs again with normal results it may be beneficial to refer to GI for further workup. He does have a follow-up visit on 07/17/2015 with Dr. Rosanna Randy for his CPE. He is to call the office if symptoms persist or worsen. - DG Chest 2 View; Future - CBC with Differential - IFOBT POC (occult bld, rslt in office)  2. Gastroesophageal reflux disease without esophagitis Has a known history of this. Ran out of the medication around the time that he had his heart attack and just forgot to ever get it filled. He has been trying to treat symptomatically with Tums without much relief. He states he does take 2 Tums nightly. I will refill his Protonix as below. He is to call the office if symptoms persist or worsen. - pantoprazole (PROTONIX) 40 MG tablet; Take 1 tablet (40 mg total) by mouth 2 (two) times daily.  Dispense: 60 tablet; Refill: [place], PA-C  [facility]

## 2015-07-05 LAB — CBC WITH DIFFERENTIAL/PLATELET
Basophils Absolute: 0 10*3/uL (ref 0.0–0.2)
Basos: 0 %
EOS (ABSOLUTE): 0.2 10*3/uL (ref 0.0–0.4)
Eos: 2 %
Hematocrit: 46.1 % (ref 37.5–51.0)
Hemoglobin: 16.1 g/dL (ref 12.6–17.7)
Immature Grans (Abs): 0 10*3/uL (ref 0.0–0.1)
Immature Granulocytes: 0 %
Lymphocytes Absolute: 2 10*3/uL (ref 0.7–3.1)
Lymphs: 19 %
MCH: 32.5 pg (ref 26.6–33.0)
MCHC: 34.9 g/dL (ref 31.5–35.7)
MCV: 93 fL (ref 79–97)
Monocytes Absolute: 1 10*3/uL — ABNORMAL HIGH (ref 0.1–0.9)
Monocytes: 9 %
Neutrophils Absolute: 7.1 10*3/uL — ABNORMAL HIGH (ref 1.4–7.0)
Neutrophils: 70 %
Platelets: 252 10*3/uL (ref 150–379)
RBC: 4.96 x10E6/uL (ref 4.14–5.80)
RDW: 14.1 % (ref 12.3–15.4)
WBC: 10.3 10*3/uL (ref 3.4–10.8)

## 2015-07-07 ENCOUNTER — Telehealth: Payer: Self-pay

## 2015-07-07 NOTE — Telephone Encounter (Signed)
Patient advised as directed below.  Thanks,  -Nicolasa Milbrath 

## 2015-07-07 NOTE — Telephone Encounter (Signed)
-----   Message from Mar Daring, PA-C sent at 07/07/2015 10:13 AM EDT ----- No anemia noted on lab work.

## 2015-07-17 ENCOUNTER — Ambulatory Visit (INDEPENDENT_AMBULATORY_CARE_PROVIDER_SITE_OTHER): Payer: Managed Care, Other (non HMO) | Admitting: Family Medicine

## 2015-07-17 ENCOUNTER — Encounter: Payer: Self-pay | Admitting: Family Medicine

## 2015-07-17 VITALS — BP 128/64 | Temp 98.7°F | Resp 16 | Ht 66.0 in | Wt 204.0 lb

## 2015-07-17 DIAGNOSIS — Z125 Encounter for screening for malignant neoplasm of prostate: Secondary | ICD-10-CM

## 2015-07-17 DIAGNOSIS — Z23 Encounter for immunization: Secondary | ICD-10-CM | POA: Diagnosis not present

## 2015-07-17 DIAGNOSIS — R042 Hemoptysis: Secondary | ICD-10-CM

## 2015-07-17 DIAGNOSIS — Z0000 Encounter for general adult medical examination without abnormal findings: Secondary | ICD-10-CM

## 2015-07-17 NOTE — Progress Notes (Signed)
Patient ID: [name], male   DOB: 12/18/1950, 64 y.o.   MRN: [id number]       Patient: Michael Leon, Male    DOB: 11-Aug-1951, 64 y.o.   MRN: [id number] Visit Date: 07/17/2015  Today's Provider: Wilhemena Durie, MD   Chief Complaint  Patient presents with  . Annual Exam   Subjective:    Annual physical exam Michael Leon is a 64 y.o. male who presents today for health maintenance and complete physical. He feels well. He reports exercising not regularly. He reports he is sleeping well.  Last: Colonoscopy-12/25/2013. Tubular adenoma. Repeat in 5 years.  EKG-04/24/2014  Tdap-03/14/2013  Endoscopy-07/11/2006. Hiatus hernia, otherwise normal.  Zoster-never   -----------------------------------------------------------------   Review of Systems  Constitutional: Negative.   HENT: Negative.   Eyes: Negative.   Respiratory: Negative.   Cardiovascular: Negative.   Gastrointestinal: Negative.   Endocrine: Negative.   Genitourinary: Negative.   Musculoskeletal: Negative.   Skin: Negative.   Allergic/Immunologic: Negative.   Neurological: Negative.   Hematological: Negative.   Psychiatric/Behavioral: Negative.     Social History He  reports that he quit smoking about 12 years ago. He does not have any smokeless tobacco history on file. He reports that he drinks about 3.5 oz of alcohol per week. He reports that he does not use illicit drugs. Social History   Social History  . Marital Status: Single    Spouse Name: N/A  . Number of Children: N/A  . Years of Education: N/A   Occupational History  . Retired Agricultural consultant    Social History Main Topics  . Smoking status: Former Smoker -- 15 years    Quit date: 10/18/2002  . Smokeless tobacco: None  . Alcohol Use: 3.5 oz/week    7 Standard drinks or equivalent per week     Comment: 1 beer a day  . Drug Use: No  . Sexual Activity: Not Asked   Other Topics Concern  . None   Social History Narrative   Gets regular  exercise - walking    Patient Active Problem List   Diagnosis Date Noted  . Breathlessness on exertion 06/05/2015  . Dizziness 05/08/2015  . Arteriosclerosis of coronary artery 05/07/2015  . HLD (hyperlipidemia) 05/07/2015  . BP (high blood pressure) 05/07/2015  . Obesity 05/07/2015  . Depression 05/07/2015  . GAD (generalized anxiety disorder) 05/07/2015  . Insomnia 05/07/2015  . GERD (gastroesophageal reflux disease) 05/07/2015  . Gout 05/07/2015  . Cervical radiculopathy 05/07/2015  . Prediabetes 05/07/2015  . Tobacco use 05/07/2015  . History of kidney stones 05/07/2015  . Benign essential HTN 05/05/2015  . Anxiety 04/30/2015  . Borderline diabetes 04/30/2015  . ST elevation (STEMI) myocardial infarction 04/30/2015  . Blood glucose elevated 04/30/2015  . CHEST PAIN UNSPECIFIED 06/19/2009    Past Surgical History  Procedure Laterality Date  . Cholecystectomy    . Appendectomy    . Tonsillectomy    . Anterior cruciate ligament repair    . Coronary stent placement    . Upper gi endoscopy      Family History  Family Status  Relation Status Death Age  . Mother Deceased 27  . Father Deceased 30  . Brother Deceased 51    commited suicide  . Son Alive    His family history includes Bladder Cancer in his maternal grandfather; Cancer in his mother; Heart attack in his father; Heart disease in his maternal grandfather; Multiple sclerosis in his mother; Stroke in his  mother.    Allergies  Allergen Reactions  . Codeine Other (See Comments)    Previous Medications   ASPIRIN 81 MG TABLET    Take 81 mg by mouth daily.   ATORVASTATIN (LIPITOR) 80 MG TABLET       BRILINTA 90 MG TABS TABLET       LOSARTAN (COZAAR) 50 MG TABLET    Take by mouth.   NITROSTAT 0.4 MG SL TABLET    PLACE 1 T UNDER THE TONGUE Q 5 MINUTES PRF CHEST PAIN UP TO 3 DOSES   PANTOPRAZOLE (PROTONIX) 40 MG TABLET    Take 1 tablet (40 mg total) by mouth 2 (two) times daily.   PAROXETINE (PAXIL) 40 MG  TABLET    TAKE 1 TABLET BY MOUTH EVERY DAY    Patient Care Team: Jerrol Banana., MD as PCP - General (Family Medicine)     Objective:   Vitals: BP 128/64 mmHg  Temp(Src) 98.7 F (37.1 C)  Resp 16  Ht 5\' 6"  (1.676 m)  Wt 204 lb (92.534 kg)  BMI 32.94 kg/m2   Physical Exam  Constitutional: He is oriented to person, place, and time. He appears well-developed and well-nourished.  HENT:  Head: Normocephalic and atraumatic.  Right Ear: External ear normal.  Left Ear: External ear normal.  Nose: Nose normal.  Eyes: Conjunctivae and EOM are normal. Pupils are equal, round, and reactive to light.  Neck: Normal range of motion.  Cardiovascular: Normal rate, regular rhythm, normal heart sounds and intact distal pulses.   Pulmonary/Chest: Effort normal and breath sounds normal.  Abdominal: Soft.  Genitourinary: Rectum normal, prostate normal and penis normal.  Musculoskeletal: Normal range of motion.  Neurological: He is alert and oriented to person, place, and time. He has normal reflexes.  Skin: Skin is warm and dry.  Psychiatric: He has a normal mood and affect. His behavior is normal. Judgment and thought content normal.     Depression Screen No flowsheet data found.    Assessment & Plan:     Routine Health Maintenance and Physical Exam  Exercise Activities and Dietary recommendations Goals    None      Immunization History  Administered Date(s) Administered  . Tdap 03/14/2013    Health Maintenance  Topic Date Due  . Hepatitis C Screening  [birth year]  . HIV Screening  08/01/1967  . COLONOSCOPY  07/31/2002  . ZOSTAVAX  07/31/2012  . INFLUENZA VACCINE  05/19/2015  . TETANUS/TDAP  03/15/2023      Discussed health benefits of physical activity, and encouraged him to engage in regular exercise appropriate for his age and condition.    --------------------------------------------------------------------

## 2015-07-18 ENCOUNTER — Encounter: Payer: Self-pay | Admitting: Gastroenterology

## 2015-07-18 LAB — CBC WITH DIFFERENTIAL/PLATELET
Basophils Absolute: 0 10*3/uL (ref 0.0–0.2)
Basos: 0 %
EOS (ABSOLUTE): 0.3 10*3/uL (ref 0.0–0.4)
Eos: 4 %
Hematocrit: 46.2 % (ref 37.5–51.0)
Hemoglobin: 16.6 g/dL (ref 12.6–17.7)
Immature Grans (Abs): 0 10*3/uL (ref 0.0–0.1)
Immature Granulocytes: 0 %
Lymphocytes Absolute: 2.6 10*3/uL (ref 0.7–3.1)
Lymphs: 34 %
MCH: 33.2 pg — ABNORMAL HIGH (ref 26.6–33.0)
MCHC: 35.9 g/dL — ABNORMAL HIGH (ref 31.5–35.7)
MCV: 92 fL (ref 79–97)
Monocytes Absolute: 0.8 10*3/uL (ref 0.1–0.9)
Monocytes: 10 %
Neutrophils Absolute: 3.9 10*3/uL (ref 1.4–7.0)
Neutrophils: 52 %
Platelets: 229 10*3/uL (ref 150–379)
RBC: 5 x10E6/uL (ref 4.14–5.80)
RDW: 14.2 % (ref 12.3–15.4)
WBC: 7.6 10*3/uL (ref 3.4–10.8)

## 2015-07-18 LAB — COMPREHENSIVE METABOLIC PANEL
ALT: 27 IU/L (ref 0–44)
AST: 22 IU/L (ref 0–40)
Albumin/Globulin Ratio: 1.8 (ref 1.1–2.5)
Albumin: 4.4 g/dL (ref 3.6–4.8)
Alkaline Phosphatase: 59 IU/L (ref 39–117)
BUN/Creatinine Ratio: 14 (ref 10–22)
BUN: 15 mg/dL (ref 8–27)
Bilirubin Total: 0.7 mg/dL (ref 0.0–1.2)
CO2: 20 mmol/L (ref 18–29)
Calcium: 9.3 mg/dL (ref 8.6–10.2)
Chloride: 100 mmol/L (ref 97–108)
Creatinine, Ser: 1.1 mg/dL (ref 0.76–1.27)
GFR calc Af Amer: 83 mL/min/{1.73_m2} (ref >59)
GFR calc non Af Amer: 72 mL/min/{1.73_m2} (ref >59)
Globulin, Total: 2.4 g/dL (ref 1.5–4.5)
Glucose: 119 mg/dL — ABNORMAL HIGH (ref 65–99)
Potassium: 4.6 mmol/L (ref 3.5–5.2)
Sodium: 139 mmol/L (ref 134–144)
Total Protein: 6.8 g/dL (ref 6.0–8.5)

## 2015-07-18 LAB — LIPID PANEL
Chol/HDL Ratio: 3.3 ratio units (ref 0.0–5.0)
Cholesterol, Total: 128 mg/dL (ref 100–199)
HDL: 39 mg/dL — ABNORMAL LOW (ref >39)
LDL Calculated: 52 mg/dL (ref 0–99)
Triglycerides: 183 mg/dL — ABNORMAL HIGH (ref 0–149)
VLDL Cholesterol Cal: 37 mg/dL (ref 5–40)

## 2015-07-18 LAB — TSH: TSH: 1.33 u[IU]/mL (ref 0.450–4.500)

## 2015-07-18 LAB — PSA: Prostate Specific Ag, Serum: 1.2 ng/mL (ref 0.0–4.0)

## 2015-07-21 ENCOUNTER — Telehealth: Payer: Self-pay

## 2015-07-21 NOTE — Telephone Encounter (Signed)
-----   Message from Jerrol Banana., MD sent at 07/18/2015  3:39 PM EDT ----- Labs OK

## 2015-07-21 NOTE — Telephone Encounter (Signed)
Advised  ED 

## 2015-08-19 ENCOUNTER — Ambulatory Visit (INDEPENDENT_AMBULATORY_CARE_PROVIDER_SITE_OTHER): Payer: Managed Care, Other (non HMO) | Admitting: Gastroenterology

## 2015-08-19 ENCOUNTER — Encounter: Payer: Self-pay | Admitting: Gastroenterology

## 2015-08-19 VITALS — BP 145/85 | HR 76 | Temp 98.8°F | Wt 207.0 lb

## 2015-08-19 DIAGNOSIS — K920 Hematemesis: Secondary | ICD-10-CM

## 2015-08-19 NOTE — Progress Notes (Signed)
Primary Care Physician: Wilhemena Durie, MD  Primary Gastroenterologist:  Dr. [name]  Chief Complaint  Patient presents with  . Spitting up blood  . Diarrhea    HPI: Michael Leon is a 64 y.o. male here due to patient waking up in the morning a few weeks back and noticed a pool of blood in his bed. The patient at first thought it was due to his cats but then realized there was blood in his mouth. The patient denies any vomiting up of blood. He also reports that he had not had any episodes of this prior to this or since then. The patient also is on a anticoagulant after having a stent placed in his heart back in July. The patient has had no rectal bleeding but he does have a lot of belching and feels bloated. The patient had a colonoscopy in the past that did show a single polyp that was removed.  Current Outpatient Prescriptions  Medication Sig Dispense Refill  . aspirin 81 MG tablet Take 81 mg by mouth daily.    Marland Kitchen atorvastatin (LIPITOR) 80 MG tablet   3  . BRILINTA 90 MG TABS tablet   3  . losartan (COZAAR) 50 MG tablet Take by mouth.    Marland Kitchen NITROSTAT 0.4 MG SL tablet PLACE 1 T UNDER THE TONGUE Q 5 MINUTES PRF CHEST PAIN UP TO 3 DOSES  11  . pantoprazole (PROTONIX) 40 MG tablet Take 1 tablet (40 mg total) by mouth 2 (two) times daily. 60 tablet 6  . PARoxetine (PAXIL) 40 MG tablet TAKE 1 TABLET BY MOUTH EVERY DAY 60 tablet 12   No current facility-administered medications for this visit.    Allergies as of 08/19/2015 - Review Complete 08/19/2015  Allergen Reaction Noted  . Codeine Other (See Comments) 05/06/2015    ROS:  General: Negative for anorexia, weight loss, fever, chills, fatigue, weakness. ENT: Negative for hoarseness, difficulty swallowing , nasal congestion. CV: Negative for chest pain, angina, palpitations, dyspnea on exertion, peripheral edema.  Respiratory: Negative for dyspnea at rest, dyspnea on exertion, cough, sputum, wheezing.  GI: See history of  present illness. GU:  Negative for dysuria, hematuria, urinary incontinence, urinary frequency, nocturnal urination.  Endo: Negative for unusual weight change.    Physical Examination:   BP 145/85 mmHg  Pulse 76  Temp(Src) 98.8 F (37.1 C) (Oral)  Wt 207 lb (93.895 kg)  General: Well-nourished, well-developed in no acute distress.  Eyes: No icterus. Conjunctivae pink. Mouth: Oropharyngeal mucosa moist and pink , no lesions erythema or exudate. Lungs: Clear to auscultation bilaterally. Non-labored. Heart: Regular rate and rhythm, no murmurs rubs or gallops.  Abdomen: Bowel sounds are normal, nontender, nondistended, no hepatosplenomegaly or masses, no abdominal bruits or hernia , no rebound or guarding.   Extremities: No lower extremity edema. No clubbing or deformities. Neuro: Alert and oriented x 3.  Grossly intact. Skin: Warm and dry, no jaundice.   Psych: Alert and cooperative, normal mood and affect.  Labs:    Imaging Studies: No results found.  Assessment and Plan:   Michael Leon is a 64 y.o. y/o male who has a history of a adenomatous polyp at his last colonoscopy and is not due for a another colonoscopy for the next few years. The patient did have a moderate amount of blood in his bed but does not recall vomiting in a the blood up. He also denies any blood from his nose. He believes he had brought  it up from his stomach although he is not sure where the blood came from despite having some blood in his mouth. The patient also reports some dysphagia to meats. Due to the patient being on anticoagulation and no intervention can be taken for any strictures or narrowing the patient will be set up for an EGD when he is able to come off of his anticoagulation next July. The patient has been told that if he has any further bleeding to contact me immediately or if his symptoms become worse he should contact me.   Note: This dictation was prepared with Dragon dictation along with  smaller phrase technology. Any transcriptional errors that result from this process are unintentional.

## 2016-01-14 ENCOUNTER — Ambulatory Visit (INDEPENDENT_AMBULATORY_CARE_PROVIDER_SITE_OTHER): Payer: Managed Care, Other (non HMO) | Admitting: Family Medicine

## 2016-01-14 ENCOUNTER — Encounter: Payer: Self-pay | Admitting: Family Medicine

## 2016-01-14 VITALS — BP 142/68 | HR 80 | Temp 98.5°F | Resp 16 | Wt 212.0 lb

## 2016-01-14 DIAGNOSIS — R7303 Prediabetes: Secondary | ICD-10-CM | POA: Diagnosis not present

## 2016-01-14 DIAGNOSIS — I10 Essential (primary) hypertension: Secondary | ICD-10-CM | POA: Diagnosis not present

## 2016-01-14 DIAGNOSIS — F32A Depression, unspecified: Secondary | ICD-10-CM

## 2016-01-14 DIAGNOSIS — S76302A Unspecified injury of muscle, fascia and tendon of the posterior muscle group at thigh level, left thigh, initial encounter: Secondary | ICD-10-CM | POA: Diagnosis not present

## 2016-01-14 DIAGNOSIS — F329 Major depressive disorder, single episode, unspecified: Secondary | ICD-10-CM | POA: Diagnosis not present

## 2016-01-14 DIAGNOSIS — K219 Gastro-esophageal reflux disease without esophagitis: Secondary | ICD-10-CM

## 2016-01-14 LAB — POCT GLYCOSYLATED HEMOGLOBIN (HGB A1C)
Est. average glucose Bld gHb Est-mCnc: 143
Hemoglobin A1C: 6.6

## 2016-01-14 MED ORDER — IBUPROFEN 800 MG PO TABS: 800.0000 mg | ORAL_TABLET | Freq: Three times a day (TID) | ORAL | Status: DC | PRN

## 2016-01-14 NOTE — Progress Notes (Signed)
Patient ID: Michael Leon, male   DOB: 1951-04-13, 65 y.o.   MRN: [id number]       Patient: Michael Leon Male    DOB: 12-May-1951   65 y.o.   MRN: [id number] Visit Date: 01/14/2016  Today's Provider: Wilhemena Durie, MD   Chief Complaint  Patient presents with  . Hypertension    6 month F/U.   Marland Kitchen Gastroesophageal Reflux  . Depression   Subjective:    HPI 1. Benign essential HTN Patient comes in today for 6 month F/U on hypertension. His BP on last OV was 145/85. He denies any headaches, shortness of breath, chest pain, or dizziness. Patient reports that he is stable on current medications. He does not check his BP at home.   2. Gastroesophageal reflux disease, esophagitis presence not specified Patient takes Protonix occasionally for reflux. He reports that he has not had any breakthrough symptoms.   3. Depression Patient reports that he is stable on Paxil 40mg . He reports that he has more good days than bad.      Allergies  Allergen Reactions  . Codeine Other (See Comments)   Previous Medications   ASPIRIN 81 MG TABLET    Take 81 mg by mouth daily.   ATORVASTATIN (LIPITOR) 80 MG TABLET       BRILINTA 90 MG TABS TABLET       LOSARTAN (COZAAR) 50 MG TABLET    Take by mouth.   NITROSTAT 0.4 MG SL TABLET    PLACE 1 T UNDER THE TONGUE Q 5 MINUTES PRF CHEST PAIN UP TO 3 DOSES   PANTOPRAZOLE (PROTONIX) 40 MG TABLET    Take 1 tablet (40 mg total) by mouth 2 (two) times daily.   PAROXETINE (PAXIL) 40 MG TABLET    TAKE 1 TABLET BY MOUTH EVERY DAY    Review of Systems  Constitutional: Negative.   HENT: Negative.   Eyes: Negative.   Respiratory: Negative.   Cardiovascular: Negative.   Endocrine: Negative.   Neurological: Negative.   Hematological: Negative.   Psychiatric/Behavioral: Negative.     Social History  Substance Use Topics  . Smoking status: Former Smoker -- 15 years    Quit date: 10/18/2002  . Smokeless tobacco: Not on file  . Alcohol Use: 3.5  oz/week    7 Standard drinks or equivalent per week     Comment: 1 beer a day   Objective:   BP 142/68 mmHg  Pulse 80  Temp(Src) 98.5 F (36.9 C)  Resp 16  Wt 212 lb (96.163 kg)  Physical Exam  Constitutional: He is oriented to person, place, and time. He appears well-developed and well-nourished.  HENT:  Head: Normocephalic and atraumatic.  Right Ear: External ear normal.  Left Ear: External ear normal.  Nose: Nose normal.  Eyes: Conjunctivae are normal.  Neck: Neck supple. No thyromegaly present.  Cardiovascular: Normal rate, regular rhythm and normal heart sounds.   Pulmonary/Chest: Effort normal and breath sounds normal.  Abdominal: Soft.  Musculoskeletal:  Right anterior rotator cuff tenderness. Moderate bruising on left posterior leg.  Lymphadenopathy:    He has no cervical adenopathy.  Neurological: He is alert and oriented to person, place, and time.  Skin: Skin is warm and dry.  Psychiatric: He has a normal mood and affect. His behavior is normal. Judgment and thought content normal.        Assessment & Plan:     1. Benign essential HTN Controlled.  2. Gastroesophageal reflux disease, esophagitis  presence not specified   3. Depression Controlled, in remission.  4. Borderline diabetes Patient went from  prediabetes to new onset diabetes today. - POCT glycosylated hemoglobin (Hb A1C)--6.6 today. - Ambulatory referral to diabetic education--Lifestyle referral. Start metformin 500 mg daily. Return to clinic 3 months. 5. Left hamstring injury, initial encounter Distal hamstring has been injured. May need referral to physical therapy. 6. Rotator cuff injury of the right shoulder/likely biceps tendinitis OTC ibuprofen has been helping so we'll go to 800 mg 3 times a day to use for the next few weeks. Mentioned the patient that orthopedic referral might be necessary. 7. Obesity Diet and exercise discussed in some detail today.      Richard Cranford Mon, MD    [facility]

## 2016-02-04 ENCOUNTER — Encounter: Payer: Self-pay | Admitting: *Deleted

## 2016-02-04 ENCOUNTER — Encounter: Payer: Managed Care, Other (non HMO) | Attending: Family Medicine | Admitting: *Deleted

## 2016-02-04 ENCOUNTER — Other Ambulatory Visit: Payer: Self-pay | Admitting: Physician Assistant

## 2016-02-04 VITALS — BP 128/80 | Ht 65.0 in | Wt 209.3 lb

## 2016-02-04 DIAGNOSIS — K219 Gastro-esophageal reflux disease without esophagitis: Secondary | ICD-10-CM

## 2016-02-04 DIAGNOSIS — E119 Type 2 diabetes mellitus without complications: Secondary | ICD-10-CM | POA: Diagnosis not present

## 2016-02-04 NOTE — Patient Instructions (Addendum)
Check blood sugars 2 x day before breakfast and 2 hrs after supper 3 x week Exercise: Begin walking for 15  minutes  3 days a week and gradually increase to 150 minutes/week Eat 3 meals day,  1-2  snacks a day Space meals 4-6 hours apart Allow 2-3 hours between meals and snacks Avoid sugar sweetened drinks (soda) Bring blood sugar records to the next class Call your doctor for a prescription for:  1. Meter strips (type) One Touch Verio checking  3 times per week  2. Lancets (type) One Touch Delica checking  3     times per week

## 2016-02-04 NOTE — Progress Notes (Signed)
Diabetes Self-Management Education  Visit Type: First/Initial  Appt. Start Time: 1030 Appt. End Time: 1150  02/04/2016  Michael Leon, identified by name and date of birth, is a 65 y.o. male with a diagnosis of Diabetes: Type 2.   ASSESSMENT  Blood pressure 128/80, height 5\' 5"  (1.651 m), weight 209 lb 4.8 oz (94.938 kg). Body mass index is 34.83 kg/(m^2).      Diabetes Self-Management Education - 02/04/16 1241    Visit Information   Visit Type First/Initial   Initial Visit   Diabetes Type Type 2   Are you currently following a meal plan? No   Are you taking your medications as prescribed? Yes   Date Diagnosed March 2017   Health Coping   How would you rate your overall health? Good   Psychosocial Assessment   Patient Belief/Attitude about Diabetes Motivated to manage diabetes   Self-care barriers None   Self-management support Doctor's office;Friends   Patient Concerns Nutrition/Meal planning;Healthy Lifestyle;Glycemic Control;Problem Solving   Special Needs None   Preferred Learning Style Auditory   Learning Readiness Ready   How often do you need to have someone help you when you read instructions, pamphlets, or other written materials from your doctor or pharmacy? 1 - Never   What is the last grade level you completed in school? 14   Complications   Last HgB A1C per patient/outside source 6.6 %  01/14/16   How often do you check your blood sugar? 0 times/day (not testing)  Provided One Touch Verio Flex and instructed on use. BG upon return demonstration was 110 m/gdL at 11:40 am - 4 1/2 hrs pp.    Have you had a dilated eye exam in the past 12 months? No   Have you had a dental exam in the past 12 months? Yes   Are you checking your feet? Yes   How many days per week are you checking your feet? 6   Dietary Intake   Breakfast sausage McMuffin; oatmeal or grits   Lunch skips   Snack (afternoon) candy bar or crackers or Slim Jim   Snack (evening) fast foods;  [place]; meat and vegetables   Beverage(s) water, sugar sweetened drinks, diet sodas   Exercise   Exercise Type ADL's   Patient Education   Previous Diabetes Education No   Disease state  Definition of diabetes, type 1 and 2, and the diagnosis of diabetes   Nutrition management  Role of diet in the treatment of diabetes and the relationship between the three main macronutrients and blood glucose level   Physical activity and exercise  Role of exercise on diabetes management, blood pressure control and cardiac health.   Monitoring Taught/evaluated SMBG meter.;Purpose and frequency of SMBG.;Identified appropriate SMBG and/or A1C goals.   Chronic complications Relationship between chronic complications and blood glucose control   Psychosocial adjustment Identified and addressed patients feelings and concerns about diabetes   Individualized Goals (developed by patient)   Reducing Risk Improve blood sugars Prevent diabetes complications Lead a healthier lifestyle   Outcomes   Expected Outcomes Demonstrated interest in learning. Expect positive outcomes      Individualized Plan for Diabetes Self-Management Training:   Learning Objective:  Patient will have a greater understanding of diabetes self-management. Patient education plan is to attend individual and/or group sessions per assessed needs and concerns.   Plan:   Patient Instructions  Check blood sugars 2 x day before breakfast and 2 hrs after supper 3 x week Exercise:  Begin walking for 15  minutes  3 days a week and gradually increase to 150 minutes/week Eat 3 meals day,  1-2  snacks a day Space meals 4-6 hours apart Allow 2-3 hours between meals and snacks Avoid sugar sweetened drinks (soda) Bring blood sugar records to the next class Call your doctor for a prescription for:  1. Meter strips (type) One Touch Verio checking  3 times per week  2. Lancets (type) One Touch Delica checking  3     times per week   Expected  Outcomes:  Demonstrated interest in learning. Expect positive outcomes  Education material provided:  General Meal Planning Guidelines Simple Meal Plan Meter - One Touch Verio Flex  If problems or questions, patient to contact team via:   Johny Drilling, [place], [place], CDE [phone]  Future DSME appointment:  Feb 23, 2016 for Diabetes Class 1

## 2016-02-09 ENCOUNTER — Other Ambulatory Visit: Payer: Self-pay

## 2016-02-09 ENCOUNTER — Telehealth: Payer: Self-pay | Admitting: Family Medicine

## 2016-02-09 DIAGNOSIS — K219 Gastro-esophageal reflux disease without esophagitis: Secondary | ICD-10-CM

## 2016-02-09 MED ORDER — ONETOUCH DELICA LANCETS 33G MISC: 1.0000 | Freq: Two times a day (BID) | Status: DC | PRN

## 2016-02-09 MED ORDER — PANTOPRAZOLE SODIUM 40 MG PO TBEC: 40.0000 mg | DELAYED_RELEASE_TABLET | Freq: Two times a day (BID) | ORAL | Status: DC

## 2016-02-09 NOTE — Telephone Encounter (Signed)
Pt is requesting a refill on his pantoprazole (PROTONIX) 40 MG tablet  He also said the [facility] put him on Meter Scripts (One Touch Verio) 3x per week  Lancets (One Touch Delica) 3x per week wanted it all to be call into [employer] [facility]

## 2016-02-10 ENCOUNTER — Other Ambulatory Visit: Payer: Self-pay

## 2016-02-10 MED ORDER — GLUCOSE BLOOD VI STRP: 1.0000 | ORAL_STRIP | Freq: Two times a day (BID) | Status: DC | PRN

## 2016-02-10 NOTE — Telephone Encounter (Signed)
Ok to send? Please advise. Thanks!

## 2016-02-10 NOTE — Telephone Encounter (Signed)
ok 

## 2016-02-10 NOTE — Telephone Encounter (Signed)
Pt stated that he also needed use to send in an RX to [employer] [facility] for testing strips. Pt stated he is out and would like it sent today. Thanks TNP

## 2016-02-23 ENCOUNTER — Encounter: Payer: Self-pay | Admitting: Dietician

## 2016-02-23 ENCOUNTER — Encounter: Payer: Managed Care, Other (non HMO) | Attending: Family Medicine | Admitting: Dietician

## 2016-02-23 VITALS — Ht 65.0 in | Wt 199.1 lb

## 2016-02-23 DIAGNOSIS — E119 Type 2 diabetes mellitus without complications: Secondary | ICD-10-CM | POA: Insufficient documentation

## 2016-02-23 NOTE — Progress Notes (Signed)

## 2016-03-01 ENCOUNTER — Encounter: Payer: Managed Care, Other (non HMO) | Admitting: Dietician

## 2016-03-01 ENCOUNTER — Encounter: Payer: Self-pay | Admitting: Dietician

## 2016-03-01 VITALS — Wt 198.6 lb

## 2016-03-01 DIAGNOSIS — E119 Type 2 diabetes mellitus without complications: Secondary | ICD-10-CM | POA: Diagnosis not present

## 2016-03-01 NOTE — Progress Notes (Signed)

## 2016-03-08 ENCOUNTER — Encounter: Payer: Managed Care, Other (non HMO) | Admitting: Dietician

## 2016-03-08 ENCOUNTER — Encounter: Payer: Self-pay | Admitting: Dietician

## 2016-03-08 VITALS — BP 120/78 | Ht 65.0 in | Wt 195.8 lb

## 2016-03-08 DIAGNOSIS — E119 Type 2 diabetes mellitus without complications: Secondary | ICD-10-CM | POA: Diagnosis not present

## 2016-03-08 NOTE — Progress Notes (Signed)
Appt. Start Time: 1750 Appt. End Time: 1050  Class 3 Psychosocial - identify DM as a source of stress; state the effects of stress on BG control; verbalize appropriate stress management techniques; identify personal stress issues   Nutritional Management - describe effects of food on blood glucose; identify sources of carbohydrate, protein and fat; verbalize the importance of balance meals in controlling blood glucose; identify meals as well balanced or not; estimate servings of carbohydrate from menus; use food labels to identify servings size, content of carbohydrate, fiber, protein, fat, saturated fat and sodium; recognize food sources of fat, saturated fat, trans fat, sodium and verbalize goals for intake; describe healthful appropriate food choices when dining out   Exercise - describe the effects of exercise on blood glucose and importance of regular exercise in controlling diabetes; state a plan for personal exercise; verbalize contraindications for exercise  Self-Monitoring - state importance of HBGM and demo procedure accurately; use HBGM results to effectively manage diabetes; identify importance of regular HbA1C testing and goals for results  Acute Complications/Sick Day Guidelines - recognize hyperglycemia and hypoglycemia with causes and effects; identify blood glucose results as high, low or in control; list steps in treating and preventing high and low blood glucose; state appropriate measure to manage blood glucose when ill (need for meds, HBGM plan, when to call physician, need for fluids)  Chronic Complications/Foot, Skin, Eye Dental Care - identify possible long-term complications of diabetes (retinopathy, neuropathy, nephropathy, cardiovascular disease, infections); explain steps in prevention and treatment of chronic complications; state importance of daily self-foot exams; describe how to examine feet and what to look for; explain appropriate eye and dental care  Lifestyle  Changes/Goals & Health/Community Resources - state benefits of making appropriate lifestyle changes; identify habits that need to change (meals, tobacco, alcohol); identify strategies to reduce risk factors for personal health; set goals for proper diabetes care; state need for and frequency of healthcare follow-up; describe appropriate community resources for good health (ADA, web sites, apps)   Teaching Materials Used: Class 3 Slide Packet Diabetes Stress Test Stress Management Tools Stress Poem Recipe/Menu Booklet Nutrition Prescription Fast Food Information Goal Setting Worksheet

## 2016-03-16 ENCOUNTER — Encounter: Payer: Self-pay | Admitting: *Deleted

## 2016-05-06 ENCOUNTER — Ambulatory Visit (INDEPENDENT_AMBULATORY_CARE_PROVIDER_SITE_OTHER): Payer: Managed Care, Other (non HMO) | Admitting: Family Medicine

## 2016-05-06 VITALS — BP 112/70 | HR 64 | Resp 16 | Wt 189.0 lb

## 2016-05-06 DIAGNOSIS — E785 Hyperlipidemia, unspecified: Secondary | ICD-10-CM | POA: Diagnosis not present

## 2016-05-06 DIAGNOSIS — K219 Gastro-esophageal reflux disease without esophagitis: Secondary | ICD-10-CM | POA: Diagnosis not present

## 2016-05-06 DIAGNOSIS — I10 Essential (primary) hypertension: Secondary | ICD-10-CM | POA: Diagnosis not present

## 2016-05-06 DIAGNOSIS — F329 Major depressive disorder, single episode, unspecified: Secondary | ICD-10-CM

## 2016-05-06 DIAGNOSIS — R7303 Prediabetes: Secondary | ICD-10-CM | POA: Diagnosis not present

## 2016-05-06 DIAGNOSIS — F32A Depression, unspecified: Secondary | ICD-10-CM

## 2016-05-06 LAB — POCT GLYCOSYLATED HEMOGLOBIN (HGB A1C): Hemoglobin A1C: 6.3

## 2016-05-06 MED ORDER — METOPROLOL TARTRATE 50 MG PO TABS: 50.0000 mg | ORAL_TABLET | Freq: Every day | ORAL | Status: DC

## 2016-05-06 MED ORDER — PANTOPRAZOLE SODIUM 40 MG PO TBEC: 40.0000 mg | DELAYED_RELEASE_TABLET | Freq: Two times a day (BID) | ORAL | Status: DC

## 2016-05-06 MED ORDER — ATORVASTATIN CALCIUM 80 MG PO TABS: 80.0000 mg | ORAL_TABLET | Freq: Every day | ORAL | Status: DC

## 2016-05-06 MED ORDER — PAROXETINE HCL 40 MG PO TABS: 40.0000 mg | ORAL_TABLET | Freq: Every day | ORAL | Status: DC

## 2016-05-06 NOTE — Progress Notes (Signed)
Subjective:  HPI  Patient is here for 4 months follow up. Diabetes: patient was checking his sugar for about 2 months since last visit and sugar was around 120. No hypoglycemic episodes. Lab Results  Component Value Date   HGBA1C 6.6 01/14/2016   Wt Readings from Last 3 Encounters:  05/06/16 189 lb (85.73 kg)  03/08/16 195 lb 12.8 oz (88.814 kg)  03/01/16 198 lb 9.6 oz (90.084 kg)   On 12/2915 weight was 212 lbs.  Hypertension: patient has been out of his Losartan for 1 week. He has not been checking his b/p. BP Readings from Last 3 Encounters:  05/06/16 112/70  03/08/16 120/78  02/04/16 128/80    Prior to Admission medications   Medication Sig Start Date End Date Taking? Authorizing Provider  aspirin 81 MG tablet Take 81 mg by mouth daily.   Yes Historical Provider, MD  atorvastatin (LIPITOR) 80 MG tablet Take 80 mg by mouth daily at 6 PM.  05/01/15  Yes Historical Provider, MD  BRILINTA 90 MG TABS tablet Take 90 mg by mouth daily.  07/02/15  Yes Historical Provider, MD  glucose blood test strip 1 each by Other route 2 (two) times daily as needed for other. Use as instructed 02/10/16  Yes Richard Maceo Pro., MD  ibuprofen (ADVIL,MOTRIN) 800 MG tablet Take 1 tablet (800 mg total) by mouth every 8 (eight) hours as needed. 01/14/16  Yes Richard Maceo Pro., MD  losartan (COZAAR) 50 MG tablet Take 50 mg by mouth daily.    Yes Historical Provider, MD  metoprolol (LOPRESSOR) 50 MG tablet Take 50 mg by mouth daily.   Yes Historical Provider, MD  NITROSTAT 0.4 MG SL tablet Reported on 02/04/2016 05/01/15  Yes Historical Provider, MD  [facility] DELICA LANCETS [id number] MISC 1 each by Does not apply route 2 (two) times daily as needed. 02/09/16  Yes Richard Maceo Pro., MD  pantoprazole (PROTONIX) 40 MG tablet Take 1 tablet (40 mg total) by mouth 2 (two) times daily. 02/09/16  Yes Richard Maceo Pro., MD  PARoxetine (PAXIL) 40 MG tablet TAKE 1 TABLET BY MOUTH EVERY DAY 05/28/15  Yes Jerrol Banana., MD    Patient Active Problem List   Diagnosis Date Noted  . Breathlessness on exertion 06/05/2015  . Dizziness 05/08/2015  . Arteriosclerosis of coronary artery 05/07/2015  . HLD (hyperlipidemia) 05/07/2015  . BP (high blood pressure) 05/07/2015  . Obesity 05/07/2015  . Depression 05/07/2015  . GAD (generalized anxiety disorder) 05/07/2015  . Insomnia 05/07/2015  . GERD (gastroesophageal reflux disease) 05/07/2015  . Gout 05/07/2015  . Cervical radiculopathy 05/07/2015  . Prediabetes 05/07/2015  . Tobacco use 05/07/2015  . History of kidney stones 05/07/2015  . Benign essential HTN 05/05/2015  . Anxiety 04/30/2015  . Borderline diabetes 04/30/2015  . ST elevation (STEMI) myocardial infarction ([place]) 04/30/2015  . Blood glucose elevated 04/30/2015  . CHEST PAIN UNSPECIFIED 06/19/2009    Past Medical History  Diagnosis Date  . Chest pain, unspecified   . Reflux   . GERD (gastroesophageal reflux disease)   . Depression   . ASCVD (arteriosclerotic cardiovascular disease)   . Cervical radiculopathy   . GAD (generalized anxiety disorder)   . Gout   . Obesity   . Diabetes mellitus without complication [facility])     Social History   Social History  . Marital Status: Single    Spouse Name: N/A  . Number of Children: N/A  . Years  of Education: N/A   Occupational History  . Retired Agricultural consultant    Social History Main Topics  . Smoking status: Former Smoker -- 1.50 packs/day for 15 years    Types: Cigarettes    Quit date: 10/18/2002  . Smokeless tobacco: Current User    Types: Chew     Comment: Dips  . Alcohol Use: 2.4 oz/week    4 Cans of beer per week     Comment: 1-2 "lite" beers per day  . Drug Use: No  . Sexual Activity: Not on file   Other Topics Concern  . Not on file   Social History Narrative   Gets regular exercise - walking    Allergies  Allergen Reactions  . Codeine Other (See Comments)    Cold sweats (30 years ago) but has taken since  with no reaction    Review of Systems  Constitutional: Negative.   Respiratory: Negative.   Cardiovascular: Negative.   Gastrointestinal: Negative.   Musculoskeletal: Negative.   Skin: Negative.   Neurological: Negative.   Endo/Heme/Allergies: Negative.   Psychiatric/Behavioral: Negative.     Immunization History  Administered Date(s) Administered  . Influenza-Unspecified 10/23/2015  . Tdap 03/14/2013  . Varicella Zoster Immune Globulin 07/17/2015   Objective:  BP 112/70 mmHg  Pulse 64  Resp 16  Wt 189 lb (85.73 kg)  Physical Exam  Constitutional: He is oriented to person, place, and time and well-developed, well-nourished, and in no distress.  HENT:  Head: Normocephalic and atraumatic.  Right Ear: External ear normal.  Left Ear: External ear normal.  Nose: Nose normal.  Eyes: Conjunctivae are normal.  Neck: Neck supple. No thyromegaly present.  Cardiovascular: Normal rate, regular rhythm and normal heart sounds.   Pulmonary/Chest: Effort normal and breath sounds normal.  Abdominal: Soft.  Lymphadenopathy:    He has no cervical adenopathy.  Neurological: He is alert and oriented to person, place, and time.  Skin: Skin is warm and dry.  Psychiatric: Mood, memory and affect normal.    Lab Results  Component Value Date   WBC 7.6 07/17/2015   HGB 17.1 04/29/2015   HCT 46.2 07/17/2015   PLT 229 07/17/2015   GLUCOSE 119* 07/17/2015   CHOL 128 07/17/2015   TRIG 183* 07/17/2015   HDL 39* 07/17/2015   LDLDIRECT 127.0 05/03/2007   LDLCALC 52 07/17/2015   TSH 1.330 07/17/2015   PSA 1.3 04/24/2014   INR 0.91 04/29/2015   HGBA1C 6.6 01/14/2016    CMP     Component Value Date/Time   NA 139 07/17/2015 1008   NA 137 04/29/2015 2001   NA 137 03/29/2012 0146   K 4.6 07/17/2015 1008   K 4.7 03/29/2012 0146   CL 100 07/17/2015 1008   CL 102 03/29/2012 0146   CO2 20 07/17/2015 1008   CO2 24 03/29/2012 0146   GLUCOSE 119* 07/17/2015 1008   GLUCOSE 107*  04/29/2015 2001   GLUCOSE 173* 03/29/2012 0146   BUN 15 07/17/2015 1008   BUN 13 04/29/2015 2001   BUN 20* 03/29/2012 0146   CREATININE 1.10 07/17/2015 1008   CREATININE 1.1 04/24/2014   CREATININE 1.17 03/29/2012 0146   CALCIUM 9.3 07/17/2015 1008   CALCIUM 9.5 03/29/2012 0146   PROT 6.8 07/17/2015 1008   PROT 7.8 04/29/2015 2001   ALBUMIN 4.4 07/17/2015 1008   ALBUMIN 4.5 04/29/2015 2001   AST 22 07/17/2015 1008   ALT 27 07/17/2015 1008   ALKPHOS 59 07/17/2015 1008   BILITOT 0.7  07/17/2015 1008   BILITOT 0.6 04/29/2015 2001   GFRNONAA 72 07/17/2015 1008   GFRNONAA >60 03/29/2012 0146   GFRAA 83 07/17/2015 1008   GFRAA >60 03/29/2012 0146    Assessment and Plan :   1. Benign essential HTN  - metoprolol (LOPRESSOR) 50 MG tablet; Take 1 tablet (50 mg total) by mouth daily.  Dispense: 90 tablet; Refill: 3  2. Gastroesophageal reflux disease, esophagitis presence not specified   3. Borderline diabetes 2 his credit patient is lost 22 pounds since his last visit with change in his dietary habits. - POCT HgB A1C Or than 50% of this visit is spent in counseling. 4. Hyperlipidemia  - atorvastatin (LIPITOR) 80 MG tablet; Take 1 tablet (80 mg total) by mouth daily at 6 PM.  Dispense: 90 tablet; Refill: 3  5. Gastroesophageal reflux disease without esophagitis May be able to cut back to one a day with the weight loss that he has had. May be able to discontinue in the future. - pantoprazole (PROTONIX) 40 MG tablet; Take 1 tablet (40 mg total) by mouth 2 (two) times daily.  Dispense: 180 tablet; Refill: 3  6. Depression  - PARoxetine (PAXIL) 40 MG tablet; Take 1 tablet (40 mg total) by mouth daily.  Dispense: 90 tablet; Refill: [facility] 05/06/2016 3:15 PM

## 2016-08-17 ENCOUNTER — Ambulatory Visit (INDEPENDENT_AMBULATORY_CARE_PROVIDER_SITE_OTHER): Payer: Managed Care, Other (non HMO) | Admitting: Family Medicine

## 2016-08-17 VITALS — BP 118/64 | HR 60 | Temp 98.4°F | Resp 16 | Ht 65.5 in | Wt 201.0 lb

## 2016-08-17 DIAGNOSIS — Z1211 Encounter for screening for malignant neoplasm of colon: Secondary | ICD-10-CM | POA: Diagnosis not present

## 2016-08-17 DIAGNOSIS — Z125 Encounter for screening for malignant neoplasm of prostate: Secondary | ICD-10-CM | POA: Diagnosis not present

## 2016-08-17 DIAGNOSIS — Z0000 Encounter for general adult medical examination without abnormal findings: Secondary | ICD-10-CM

## 2016-08-17 DIAGNOSIS — N489 Disorder of penis, unspecified: Secondary | ICD-10-CM | POA: Diagnosis not present

## 2016-08-17 LAB — POCT URINALYSIS DIPSTICK
Bilirubin, UA: NEGATIVE
Blood, UA: NEGATIVE
Glucose, UA: NEGATIVE
Ketones, UA: NEGATIVE
Leukocytes, UA: NEGATIVE
Nitrite, UA: NEGATIVE
Protein, UA: NEGATIVE
Spec Grav, UA: 1.02
Urobilinogen, UA: NEGATIVE
pH, UA: 6.5

## 2016-08-17 LAB — IFOBT (OCCULT BLOOD): IFOBT: NEGATIVE

## 2016-08-17 NOTE — Progress Notes (Signed)
Patient: Michael Leon, Male    DOB: 02-17-1951, 65 y.o.   MRN: [id number] Visit Date: 08/17/2016  Today's Provider: Wilhemena Durie, MD   Chief Complaint  Patient presents with  . Annual Exam   Subjective:  Michael Leon is a 65 y.o. male who presents today for health maintenance and complete physical. He feels fairly well. He reports exercising none. He reports he is sleeping poorly. He does snore nightly. We'll check to see if anybody has witnessed any apneic spells. He is bothered sometimes by vivid and silly dreams.  12/25/2013 Colonoscopy   Immunization History  Administered Date(s) Administered  . Influenza-Unspecified 10/23/2015  . Tdap 03/14/2013  . Varicella Zoster Immune Globulin 07/17/2015     Review of Systems  Constitutional: Negative.   HENT: Positive for dental problem.   Eyes: Negative.   Respiratory: Negative.   Cardiovascular: Negative.   Gastrointestinal: Negative.   Endocrine: Negative.   Genitourinary: Negative.   Musculoskeletal: Negative.   Skin: Negative.   Neurological: Negative.   Hematological: Negative.   Psychiatric/Behavioral: Positive for sleep disturbance.    Social History   Social History  . Marital status: Single    Spouse name: N/A  . Number of children: N/A  . Years of education: N/A   Occupational History  . Retired Agricultural consultant    Social History Main Topics  . Smoking status: Former Smoker    Packs/day: 1.50    Years: 15.00    Types: Cigarettes    Quit date: 10/18/2002  . Smokeless tobacco: Current User    Types: Chew     Comment: Dips  . Alcohol use 2.4 oz/week    4 Cans of beer per week     Comment: 1-2 "lite" beers per day  . Drug use: No  . Sexual activity: Not on file   Other Topics Concern  . Not on file   Social History Narrative   Gets regular exercise - walking    Patient Active Problem List   Diagnosis Date Noted  . Breathlessness on exertion 06/05/2015  . Dizziness 05/08/2015  .  Arteriosclerosis of coronary artery 05/07/2015  . HLD (hyperlipidemia) 05/07/2015  . BP (high blood pressure) 05/07/2015  . Obesity 05/07/2015  . Depression 05/07/2015  . GAD (generalized anxiety disorder) 05/07/2015  . Insomnia 05/07/2015  . GERD (gastroesophageal reflux disease) 05/07/2015  . Gout 05/07/2015  . Cervical radiculopathy 05/07/2015  . Prediabetes 05/07/2015  . Tobacco use 05/07/2015  . History of kidney stones 05/07/2015  . Benign essential HTN 05/05/2015  . Anxiety 04/30/2015  . Borderline diabetes 04/30/2015  . ST elevation (STEMI) myocardial infarction ([place]) 04/30/2015  . Blood glucose elevated 04/30/2015  . CHEST PAIN UNSPECIFIED 06/19/2009    Past Surgical History:  Procedure Laterality Date  . ANTERIOR CRUCIATE LIGAMENT REPAIR    . APPENDECTOMY    . CHOLECYSTECTOMY    . CORONARY STENT PLACEMENT    . TONSILLECTOMY    . UPPER GI ENDOSCOPY      His family history includes Bladder Cancer in his maternal grandfather; Cancer in his mother; Heart attack in his father; Heart disease in his maternal grandfather; Multiple sclerosis in his mother; Stroke in his mother.    Outpatient Encounter Prescriptions as of 08/17/2016  Medication Sig Note  . aspirin 81 MG tablet Take 81 mg by mouth daily.   Marland Kitchen atorvastatin (LIPITOR) 80 MG tablet Take 1 tablet (80 mg total) by mouth daily at 6 PM.   . glucose blood  test strip 1 each by Other route 2 (two) times daily as needed for other. Use as instructed   . ibuprofen (ADVIL,MOTRIN) 800 MG tablet Take 1 tablet (800 mg total) by mouth every 8 (eight) hours as needed.   . metoprolol (LOPRESSOR) 50 MG tablet Take 1 tablet (50 mg total) by mouth daily.   Marland Kitchen NITROSTAT 0.4 MG SL tablet Reported on 02/04/2016 05/08/2015: Received from: External Pharmacy  . ONETOUCH DELICA LANCETS 99991111 MISC 1 each by Does not apply route 2 (two) times daily as needed.   . pantoprazole (PROTONIX) 40 MG tablet Take 1 tablet (40 mg total) by mouth 2 (two)  times daily.   Marland Kitchen PARoxetine (PAXIL) 40 MG tablet Take 1 tablet (40 mg total) by mouth daily.   . [DISCONTINUED] BRILINTA 90 MG TABS tablet Take 90 mg by mouth daily.  07/04/2015: Received from: External Pharmacy  . [DISCONTINUED] losartan (COZAAR) 50 MG tablet Take 50 mg by mouth daily.  05/07/2015: Received from: [facility]   No facility-administered encounter medications on file as of 08/17/2016.     Patient Care Team: Jerrol Banana., MD as PCP - General (Family Medicine)     Objective:   Vitals:  Vitals:   08/17/16 0946  BP: 118/64  Pulse: 60  Resp: 16  Temp: 98.4 F (36.9 C)  TempSrc: Oral  Weight: 201 lb (91.2 kg)  Height: 5' 5.5" (1.664 m)    Physical Exam  Constitutional: He is oriented to person, place, and time. He appears well-developed and well-nourished.  HENT:  Head: Normocephalic and atraumatic.  Right Ear: External ear normal.  Left Ear: External ear normal.  Nose: Nose normal.  Mouth/Throat: Oropharynx is clear and moist.  Eyes: Conjunctivae and EOM are normal. Pupils are equal, round, and reactive to light.  Neck: Normal range of motion. Neck supple.  Cardiovascular: Normal rate, regular rhythm, normal heart sounds and intact distal pulses.   Pulmonary/Chest: Effort normal and breath sounds normal.  Abdominal: Soft. Bowel sounds are normal.  Genitourinary: Rectum normal, prostate normal and penis normal.  Genitourinary Comments: Plaque lesion  present on shaft of the right side of the penis. This is about 5 mm in size.  Musculoskeletal: Normal range of motion.  Neurological: He is alert and oriented to person, place, and time.  Skin: Skin is warm and dry.  Psychiatric: He has a normal mood and affect. His behavior is normal. Judgment and thought content normal.   Current Exercise Habits: The patient does not participate in regular exercise at present    Functional Status Survey: Is the patient deaf or have difficulty  hearing?: No Does the patient have difficulty seeing, even when wearing glasses/contacts?: No Does the patient have difficulty concentrating, remembering, or making decisions?: No Does the patient have difficulty walking or climbing stairs?: No Does the patient have difficulty dressing or bathing?: No Does the patient have difficulty doing errands alone such as visiting a doctor's office or shopping?: No  Fall Risk  08/17/2016 03/08/2016 03/01/2016 02/23/2016 02/04/2016  Falls in the past year? No (No Data) No No No     Depression Screen PHQ 2/9 Scores 08/17/2016 02/04/2016  PHQ - 2 Score 0 0      Assessment & Plan:     Routine Health Maintenance and Physical Exam  Exercise Activities and Dietary recommendations Goals    None      Immunization History  Administered Date(s) Administered  . Influenza-Unspecified 10/23/2015  . Tdap 03/14/2013  .  Varicella Zoster Immune Globulin 07/17/2015    Health Maintenance  Topic Date Due  . Hepatitis C Screening  1950-11-25  . PNEUMOCOCCAL POLYSACCHARIDE VACCINE (1) 07/31/1954  . FOOT EXAM  07/31/1962  . OPHTHALMOLOGY EXAM  07/31/1962  . HIV Screening  08/01/1967  . COLONOSCOPY  07/31/2002  . ZOSTAVAX  07/31/2012  . INFLUENZA VACCINE  05/18/2016  . HEMOGLOBIN A1C  11/06/2016  . TETANUS/TDAP  03/15/2023     Good overall health. Recommended regular diet and exercise changes to work on weight loss. Discussed health benefits of physical activity, and encouraged him to engage in regular exercise appropriate for his age and condition.   Hypertension Hyperlipidemia GERD Mild obesity CAD All risk factors treated Possible obstructive sleep apnea Consider sleep study Lesion on shaft of penis Refer to urology.  I have done the exam and reviewed the chart and it is accurate to the best of my knowledge. Miguel Aschoff M.D. [facility]

## 2016-08-18 LAB — CBC WITH DIFFERENTIAL/PLATELET
Basophils Absolute: 0 10*3/uL (ref 0.0–0.2)
Basos: 0 %
EOS (ABSOLUTE): 0.3 10*3/uL (ref 0.0–0.4)
Eos: 4 %
Hematocrit: 46.4 % (ref 37.5–51.0)
Hemoglobin: 16.6 g/dL (ref 12.6–17.7)
Immature Grans (Abs): 0 10*3/uL (ref 0.0–0.1)
Immature Granulocytes: 0 %
Lymphocytes Absolute: 3.3 10*3/uL — ABNORMAL HIGH (ref 0.7–3.1)
Lymphs: 36 %
MCH: 32.4 pg (ref 26.6–33.0)
MCHC: 35.8 g/dL — ABNORMAL HIGH (ref 31.5–35.7)
MCV: 90 fL (ref 79–97)
Monocytes Absolute: 0.7 10*3/uL (ref 0.1–0.9)
Monocytes: 7 %
Neutrophils Absolute: 4.7 10*3/uL (ref 1.4–7.0)
Neutrophils: 53 %
Platelets: 208 10*3/uL (ref 150–379)
RBC: 5.13 x10E6/uL (ref 4.14–5.80)
RDW: 13.4 % (ref 12.3–15.4)
WBC: 9 10*3/uL (ref 3.4–10.8)

## 2016-08-18 LAB — TSH: TSH: 1.88 u[IU]/mL (ref 0.450–4.500)

## 2016-08-18 LAB — COMPREHENSIVE METABOLIC PANEL
ALT: 19 IU/L (ref 0–44)
AST: 19 IU/L (ref 0–40)
Albumin/Globulin Ratio: 1.8 (ref 1.2–2.2)
Albumin: 4.4 g/dL (ref 3.6–4.8)
Alkaline Phosphatase: 55 IU/L (ref 39–117)
BUN/Creatinine Ratio: 16 (ref 10–24)
BUN: 16 mg/dL (ref 8–27)
Bilirubin Total: 0.8 mg/dL (ref 0.0–1.2)
CO2: 24 mmol/L (ref 18–29)
Calcium: 9.4 mg/dL (ref 8.6–10.2)
Chloride: 101 mmol/L (ref 96–106)
Creatinine, Ser: 1.02 mg/dL (ref 0.76–1.27)
GFR calc Af Amer: 89 mL/min/{1.73_m2} (ref >59)
GFR calc non Af Amer: 77 mL/min/{1.73_m2} (ref >59)
Globulin, Total: 2.4 g/dL (ref 1.5–4.5)
Glucose: 115 mg/dL — ABNORMAL HIGH (ref 65–99)
Potassium: 4.7 mmol/L (ref 3.5–5.2)
Sodium: 139 mmol/L (ref 134–144)
Total Protein: 6.8 g/dL (ref 6.0–8.5)

## 2016-08-18 LAB — PSA: Prostate Specific Ag, Serum: 1.4 ng/mL (ref 0.0–4.0)

## 2016-08-18 LAB — LIPID PANEL WITH LDL/HDL RATIO
Cholesterol, Total: 156 mg/dL (ref 100–199)
HDL: 45 mg/dL (ref >39)
LDL Calculated: 63 mg/dL (ref 0–99)
LDl/HDL Ratio: 1.4 ratio units (ref 0.0–3.6)
Triglycerides: 240 mg/dL — ABNORMAL HIGH (ref 0–149)
VLDL Cholesterol Cal: 48 mg/dL — ABNORMAL HIGH (ref 5–40)

## 2016-08-31 ENCOUNTER — Encounter: Payer: Self-pay | Admitting: Urology

## 2016-08-31 ENCOUNTER — Ambulatory Visit (INDEPENDENT_AMBULATORY_CARE_PROVIDER_SITE_OTHER): Payer: Managed Care, Other (non HMO) | Admitting: Urology

## 2016-08-31 VITALS — BP 164/94 | HR 89 | Ht 64.0 in | Wt 199.2 lb

## 2016-08-31 DIAGNOSIS — N489 Disorder of penis, unspecified: Secondary | ICD-10-CM | POA: Diagnosis not present

## 2016-08-31 MED ORDER — NYSTATIN-TRIAMCINOLONE 100000-0.1 UNIT/GM-% EX OINT: 1.0000 "application " | TOPICAL_OINTMENT | Freq: Two times a day (BID) | CUTANEOUS | 0 refills | Status: DC

## 2016-08-31 NOTE — Progress Notes (Signed)
08/31/2016 3:17 PM   Michael Leon [birth year] [id number]  Referring provider: Jerrol Banana., MD [address] [place], [place] [address]  Chief Complaint  Patient presents with  . New Patient (Initial Visit)    Penile lesion referred by Dr. Rosanna Randy    HPI: Patient is a 65 year old Caucasian male who is referred by Dr. Rosanna Randy for a penile lesion.  Patient states that the lesion has been present for months.  He has tried numerous OTC creams in the efforts to treat the lesions.  This has been unsuccessful.  He even has tried to shave it off with a razor.    The lesion is itchy.  It has not appeared wart-like or ulcer in nature.  It has not changed since it first appeared.  It has not had a discharge or been painful.   He has not noted other areas on other parts of his body.  He is sexually active in a monogamous relationship.     PMH: Past Medical History:  Diagnosis Date  . ASCVD (arteriosclerotic cardiovascular disease)   . Cervical radiculopathy   . Chest pain, unspecified   . Depression   . Diabetes ([place])   . GAD (generalized anxiety disorder)   . GERD (gastroesophageal reflux disease)   . Gout   . Heart attack   . HLD (hyperlipidemia)   . Kidney stone   . Obesity   . Reflux     Surgical History: Past Surgical History:  Procedure Laterality Date  . ANTERIOR CRUCIATE LIGAMENT REPAIR    . APPENDECTOMY    . CHOLECYSTECTOMY    . CORONARY STENT PLACEMENT    . TONSILLECTOMY    . UPPER GI ENDOSCOPY      Home Medications:    Medication List       Accurate as of 08/31/16  3:17 PM. Always use your most recent med list.          aspirin 81 MG tablet Take 81 mg by mouth daily.   atorvastatin 80 MG tablet Commonly known as:  LIPITOR Take 1 tablet (80 mg total) by mouth daily at 6 PM.   diphenhydrAMINE 25 mg capsule Commonly known as:  BENADRYL Take by mouth.   glucose blood test strip 1 each by Other route 2 (two) times daily  as needed for other. Use as instructed   ibuprofen 800 MG tablet Commonly known as:  ADVIL,MOTRIN Take 1 tablet (800 mg total) by mouth every 8 (eight) hours as needed.   losartan 50 MG tablet Commonly known as:  COZAAR Take by mouth.   metoprolol 50 MG tablet Commonly known as:  LOPRESSOR Take 1 tablet (50 mg total) by mouth daily.   metoprolol succinate 50 MG 24 hr tablet Commonly known as:  TOPROL-XL Take by mouth.   NITROSTAT 0.4 MG SL tablet Generic drug:  nitroGLYCERIN Reported on 02/04/2016   nystatin-triamcinolone ointment Commonly known as:  MYCOLOG Apply 1 application topically 2 (two) times daily.   ONETOUCH DELICA LANCETS 99991111 Misc 1 each by Does not apply route 2 (two) times daily as needed.   pantoprazole 40 MG tablet Commonly known as:  PROTONIX Take 1 tablet (40 mg total) by mouth 2 (two) times daily.   PARoxetine 40 MG tablet Commonly known as:  PAXIL Take 1 tablet (40 mg total) by mouth daily.   PLAVIX PO Take by mouth.   ZANTAC 75 PO Take by mouth.  Allergies:  Allergies  Allergen Reactions  . Codeine Other (See Comments)    Cold sweats (30 years ago) but has taken since with no reaction    Family History: Family History  Problem Relation Age of Onset  . Cancer Mother     lung  . Stroke Mother   . Multiple sclerosis Mother   . Heart attack Father   . Bladder Cancer Maternal Grandfather   . Heart disease Maternal Grandfather   . Prostate cancer Neg Hx   . Kidney disease Neg Hx     Social History:  reports that he quit smoking about 13 years ago. His smoking use included Cigarettes. He has a 22.50 pack-year smoking history. His smokeless tobacco use includes Chew. He reports that he drinks about 2.4 oz of alcohol per week . He reports that he does not use drugs.  ROS: UROLOGY Frequent Urination?: No Hard to postpone urination?: No Burning/pain with urination?: No Get up at night to urinate?: No Leakage of urine?: No Urine  stream starts and stops?: No Trouble starting stream?: No Do you have to strain to urinate?: No Blood in urine?: No Urinary tract infection?: No Sexually transmitted disease?: No Injury to kidneys or bladder?: No Painful intercourse?: No Weak stream?: No Erection problems?: No Penile pain?: No  Gastrointestinal Nausea?: No Vomiting?: No Indigestion/heartburn?: No Diarrhea?: No Constipation?: No  Constitutional Fever: No Night sweats?: No Weight loss?: No Fatigue?: No  Skin Skin rash/lesions?: No Itching?: No  Eyes Blurred vision?: No Double vision?: No  Ears/Nose/Throat Sore throat?: No Sinus problems?: No  Hematologic/Lymphatic Swollen glands?: No Easy bruising?: No  Cardiovascular Leg swelling?: No Chest pain?: No  Respiratory Cough?: No Shortness of breath?: No  Endocrine Excessive thirst?: No  Musculoskeletal Back pain?: No Joint pain?: No  Neurological Headaches?: No Dizziness?: No  Psychologic Depression?: No Anxiety?: No  Physical Exam: BP (!) 164/94   Pulse 89   Ht 5\' 4"  (1.626 m)   Wt 199 lb 3.2 oz (90.4 kg)   BMI 34.19 kg/m   Constitutional: Well nourished. Alert and oriented, No acute distress. HEENT: [place] AT, moist mucus membranes. Trachea midline, no masses. Cardiovascular: No clubbing, cyanosis, or edema. Respiratory: Normal respiratory effort, no increased work of breathing. GI: Abdomen is soft, non tender, non distended, no abdominal masses. Liver and spleen not palpable.  No hernias appreciated.  Stool sample for occult testing is not indicated.   GU: No CVA tenderness.  No bladder fullness or masses.  Patient with uncircumcised phallus. Foreskin easily retracted.  Urethral meatus is patent.  No penile discharge. A 5 mm x 3 mm plaque with scale, pink, soft, round with well-defined margins.  No penile lesions or rashes. Scrotum without lesions, cysts, rashes and/or edema.  Testicles are located scrotally bilaterally. No  masses are appreciated in the testicles. Left and right epididymis are normal. Rectal: Deferred.   Skin: No rashes, bruises or suspicious lesions. Lymph: No cervical or inguinal adenopathy. Neurologic: Grossly intact, no focal deficits, moving all 4 extremities. Psychiatric: Normal mood and affect.  Laboratory Data: Lab Results  Component Value Date   WBC 9.0 08/17/2016   HGB 17.1 04/29/2015   HCT 46.4 08/17/2016   MCV 90 08/17/2016   PLT 208 08/17/2016    Lab Results  Component Value Date   CREATININE 1.02 08/17/2016    Lab Results  Component Value Date   PSA 1.3 04/24/2014       PSA  1.2                                                                   07/17/2015      PSA                        1.4                                                                   08/17/2016   Lab Results  Component Value Date   HGBA1C 6.3 05/06/2016    Lab Results  Component Value Date   TSH 1.880 08/17/2016       Component Value Date/Time   CHOL 156 08/17/2016 1031   HDL 45 08/17/2016 1031   CHOLHDL 3.3 07/17/2015 1008   CHOLHDL 5.0 CALC 05/03/2007 1022   VLDL 40 05/03/2007 1022   LDLCALC 63 08/17/2016 1031    Lab Results  Component Value Date   AST 19 08/17/2016   Lab Results  Component Value Date   ALT 19 08/17/2016      Assessment & Plan:    1. Penile lesion  - Explained to the patient that it is difficult to know what the lesion may be due to changes the area may have undergone in response to OTC creams and slicing it with a razor  - Will give a 2 week trial of a fungal/steroid cream  - If lesion did not abate, I explained to the patient that we may need to biopsy the lesion for a clearer diagnosis  Return in about 2 weeks (around 09/14/2016) for recheck.  These notes generated with voice recognition software. I apologize for typographical errors.  Zara Council, [facility] [address], [place], [place] [address] [phone]

## 2016-09-15 ENCOUNTER — Encounter: Payer: Self-pay | Admitting: Urology

## 2016-09-15 ENCOUNTER — Ambulatory Visit (INDEPENDENT_AMBULATORY_CARE_PROVIDER_SITE_OTHER): Payer: Managed Care, Other (non HMO) | Admitting: Urology

## 2016-09-15 VITALS — BP 139/85 | HR 53 | Ht 65.0 in | Wt 207.7 lb

## 2016-09-15 DIAGNOSIS — N489 Disorder of penis, unspecified: Secondary | ICD-10-CM

## 2016-09-15 NOTE — Progress Notes (Signed)
09/15/2016 2:43 PM   [place] 1951/09/21 [id number]  Referring provider: Jerrol Banana., MD [address] [place], [place] [address]  Chief Complaint  Patient presents with  . Follow-up    2 weeks Penile lesion    HPI: Patient is a 65 year old Caucasian male who presents today for a recheck of a penile lesion.  Background history Patient was referred by Dr. Rosanna Randy for a penile lesion.  Patient states that the lesion has been present for months.  He has tried numerous OTC creams in the efforts to treat the lesions.  This has been unsuccessful.  He even has tried to shave it off with a razor.  The lesion is itchy.  It has not appeared wart-like or ulcer in nature.  It has not changed since it first appeared.  It has not had a discharge or been painful.   He has not noted other areas on other parts of his body.  He is sexually active in a monogamous relationship.    At his visit two weeks ago, we started Mycolog cream to apply twice daily to the lesion.  He presents today for an reexamination of the lesion.  He states that the lesion has not changed at all with the cream.     PMH: Past Medical History:  Diagnosis Date  . ASCVD (arteriosclerotic cardiovascular disease)   . Cervical radiculopathy   . Chest pain, unspecified   . Depression   . Diabetes ([place])   . GAD (generalized anxiety disorder)   . GERD (gastroesophageal reflux disease)   . Gout   . Heart attack   . HLD (hyperlipidemia)   . Kidney stone   . Obesity   . Reflux     Surgical History: Past Surgical History:  Procedure Laterality Date  . ANTERIOR CRUCIATE LIGAMENT REPAIR    . APPENDECTOMY    . CHOLECYSTECTOMY    . CORONARY STENT PLACEMENT    . TONSILLECTOMY    . UPPER GI ENDOSCOPY      Home Medications:    Medication List       Accurate as of 09/15/16  2:43 PM. Always use your most recent med list.          aspirin 81 MG tablet Take 81 mg by mouth daily.     atorvastatin 80 MG tablet Commonly known as:  LIPITOR Take 1 tablet (80 mg total) by mouth daily at 6 PM.   diphenhydrAMINE 25 mg capsule Commonly known as:  BENADRYL Take by mouth.   glucose blood test strip 1 each by Other route 2 (two) times daily as needed for other. Use as instructed   ibuprofen 800 MG tablet Commonly known as:  ADVIL,MOTRIN Take 1 tablet (800 mg total) by mouth every 8 (eight) hours as needed.   losartan 50 MG tablet Commonly known as:  COZAAR Take by mouth.   metoprolol 50 MG tablet Commonly known as:  LOPRESSOR Take 1 tablet (50 mg total) by mouth daily.   metoprolol succinate 50 MG 24 hr tablet Commonly known as:  TOPROL-XL Take by mouth.   NITROSTAT 0.4 MG SL tablet Generic drug:  nitroGLYCERIN Reported on 02/04/2016   nystatin-triamcinolone ointment Commonly known as:  MYCOLOG Apply 1 application topically 2 (two) times daily.   ONETOUCH DELICA LANCETS 99991111 Misc 1 each by Does not apply route 2 (two) times daily as needed.   pantoprazole 40 MG tablet Commonly known as:  PROTONIX Take  1 tablet (40 mg total) by mouth 2 (two) times daily.   PARoxetine 40 MG tablet Commonly known as:  PAXIL Take 1 tablet (40 mg total) by mouth daily.   PLAVIX PO Take by mouth.   ZANTAC 75 PO Take by mouth.       Allergies:  Allergies  Allergen Reactions  . Codeine Other (See Comments)    Cold sweats (30 years ago) but has taken since with no reaction    Family History: Family History  Problem Relation Age of Onset  . Cancer Mother     lung  . Stroke Mother   . Multiple sclerosis Mother   . Heart attack Father   . Bladder Cancer Maternal Grandfather   . Heart disease Maternal Grandfather   . Prostate cancer Neg Hx   . Kidney disease Neg Hx     Social History:  reports that he quit smoking about 13 years ago. His smoking use included Cigarettes. He has a 22.50 pack-year smoking history. His smokeless tobacco use includes Chew. He  reports that he drinks about 2.4 oz of alcohol per week . He reports that he does not use drugs.  ROS: UROLOGY Frequent Urination?: No Hard to postpone urination?: No Burning/pain with urination?: No Get up at night to urinate?: No Leakage of urine?: No Urine stream starts and stops?: No Trouble starting stream?: No Do you have to strain to urinate?: No Blood in urine?: No Urinary tract infection?: No Sexually transmitted disease?: No Injury to kidneys or bladder?: No Painful intercourse?: No Weak stream?: No Erection problems?: No Penile pain?: No  Gastrointestinal Nausea?: No Vomiting?: No Indigestion/heartburn?: No Diarrhea?: No Constipation?: No  Constitutional Fever: No Night sweats?: No Weight loss?: No Fatigue?: No  Skin Skin rash/lesions?: No Itching?: No  Eyes Blurred vision?: No Double vision?: No  Ears/Nose/Throat Sore throat?: No Sinus problems?: No  Hematologic/Lymphatic Swollen glands?: No Easy bruising?: No  Cardiovascular Leg swelling?: No Chest pain?: No  Respiratory Cough?: No Shortness of breath?: No  Endocrine Excessive thirst?: No  Musculoskeletal Back pain?: No Joint pain?: No  Neurological Headaches?: No Dizziness?: No  Psychologic Depression?: No Anxiety?: No  Physical Exam: BP 139/85   Pulse (!) 53   Ht 5\' 5"  (1.651 m)   Wt 207 lb 11.2 oz (94.2 kg)   BMI 34.56 kg/m   Constitutional: Well nourished. Alert and oriented, No acute distress. HEENT: [place] AT, moist mucus membranes. Trachea midline, no masses. Cardiovascular: No clubbing, cyanosis, or edema. Respiratory: Normal respiratory effort, no increased work of breathing. GI: Abdomen is soft, non tender, non distended, no abdominal masses. Liver and spleen not palpable.  No hernias appreciated.  Stool sample for occult testing is not indicated.   GU: No CVA tenderness.  No bladder fullness or masses.  Patient with uncircumcised phallus. Foreskin easily  retracted.  Urethral meatus is patent.  No penile discharge. A 5 mm x 3 mm plaque with scale, pink, soft, round with well-defined margins.  No penile lesions or rashes. Scrotum without lesions, cysts, rashes and/or edema.  Testicles are located scrotally bilaterally. No masses are appreciated in the testicles. Left and right epididymis are normal. Rectal: Deferred.   Skin: No rashes, bruises or suspicious lesions. Lymph: No cervical or inguinal adenopathy. Neurologic: Grossly intact, no focal deficits, moving all 4 extremities. Psychiatric: Normal mood and affect.  Laboratory Data: Lab Results  Component Value Date   WBC 9.0 08/17/2016   HGB 17.1 04/29/2015   HCT 46.4 08/17/2016  MCV 90 08/17/2016   PLT 208 08/17/2016    Lab Results  Component Value Date   CREATININE 1.02 08/17/2016    Lab Results  Component Value Date   PSA 1.3 04/24/2014       PSA                        1.2                                                                   07/17/2015      PSA                        1.4                                                                   08/17/2016   Lab Results  Component Value Date   HGBA1C 6.3 05/06/2016    Lab Results  Component Value Date   TSH 1.880 08/17/2016       Component Value Date/Time   CHOL 156 08/17/2016 1031   HDL 45 08/17/2016 1031   CHOLHDL 3.3 07/17/2015 1008   CHOLHDL 5.0 CALC 05/03/2007 1022   VLDL 40 05/03/2007 1022   LDLCALC 63 08/17/2016 1031    Lab Results  Component Value Date   AST 19 08/17/2016   Lab Results  Component Value Date   ALT 19 08/17/2016      Assessment & Plan:    1. Penile lesion  - Explained to the patient that it is difficult to know what the lesion may be due to changes the area may have undergone in response to OTC creams and slicing it with a razor  - lesion did not clear, need to biopsy the lesion for a clearer diagnosis  Return for schedule penile biopsy.  These notes generated with  voice recognition software. I apologize for typographical errors.  Zara Council, [facility] [address] [place], [place] [address] [phone]

## 2016-10-01 ENCOUNTER — Ambulatory Visit (INDEPENDENT_AMBULATORY_CARE_PROVIDER_SITE_OTHER): Payer: Managed Care, Other (non HMO) | Admitting: Urology

## 2016-10-01 ENCOUNTER — Encounter: Payer: Self-pay | Admitting: Urology

## 2016-10-01 VITALS — BP 156/82 | HR 67 | Ht 64.0 in | Wt 197.0 lb

## 2016-10-01 DIAGNOSIS — N489 Disorder of penis, unspecified: Secondary | ICD-10-CM | POA: Diagnosis not present

## 2016-10-01 NOTE — Progress Notes (Signed)
   10/01/16  CC:  Chief Complaint  Patient presents with  . Follow-up    penile lesion possible biopsy    HPI: 65 year old male with nonhealing right lateral mid shaft penile lesion which is present for approximately one year, unchanged. He tried several ointments as well as self excision with a razor without resolution.  + smoking history.  He presents to the office today for excisional biopsy under local anesthesia.    Consent was obtained/signed. All questions answered prior to the procedure today.  Blood pressure (!) 156/82, pulse 67, height 5\' 4"  (1.626 m), weight 197 lb (89.4 kg). NED. A&Ox3.   No respiratory distress   Abd soft, NT, ND Uncircumcised phallus with easily retractable foreskin. Approximately 1 cm right 5 mm scaly, peak penile plaque on right midshaft, mobile, nonindurated.   Testicles descended bilaterally. Orthotopic urethral meatus.  Penile biopsy procedure   Informed consistent obtained.  Risks discussed.  A timeout procedure was performed to confirm the patient's name, date of birth, and procedure.  He was then prepped and draped in a sterile fashion.    Pre-Procedure: -Approximately 5 cc of 1% lidocaine was instilled into the skin surrounding and subcutaneous tissue underlying the penile lesion.  Procedure: - The lesion in question was measured to be 1 cm x 5 mm -After the anesthetic was tested, an elliptical incision was created around the lesion down to subcutaneous tissue using a 15 blade knife -The incised skin was freed up from the underlying tissue using Metzenbaum scissors and passed off the field as specimen -The wound was then closed in a linear fashion along the length of the penile shaft using 5 interrupted 3-0 chromic sutures -A dressing of Xeroform and gauze was applied.    Assessment/ Plan:  1. Penile lesion S/p uncomplicated excisional biopsy under local anesthesia Post procedure instructions were reviewed Follow-up in 1 week with  Zara Council for wound check and biopsy results  Hollice Espy, MD

## 2016-10-05 ENCOUNTER — Other Ambulatory Visit: Payer: Self-pay | Admitting: Urology

## 2016-10-08 ENCOUNTER — Ambulatory Visit (INDEPENDENT_AMBULATORY_CARE_PROVIDER_SITE_OTHER): Payer: Managed Care, Other (non HMO) | Admitting: Urology

## 2016-10-08 ENCOUNTER — Encounter: Payer: Self-pay | Admitting: Urology

## 2016-10-08 VITALS — BP 153/87 | HR 70 | Ht 64.0 in | Wt 204.0 lb

## 2016-10-08 DIAGNOSIS — N489 Disorder of penis, unspecified: Secondary | ICD-10-CM

## 2016-10-08 LAB — PATHOLOGY

## 2016-10-08 NOTE — Progress Notes (Signed)
10/08/2016 9:09 AM   Michael Leon December 23, 1950 [id number]  Referring provider: Jerrol Banana., MD [address] [place], [place] [address]  Chief Complaint  Patient presents with  . Wound Check    Penile lesion after biopsy    HPI: Patient is a 65 year old Caucasian male who presents today for a recheck after undergoing a penile biopsy on 10/01/2016 for a penile lesion.    Background history Patient was referred by Dr. Rosanna Randy for a penile lesion.  Patient states that the lesion has been present for months.  He has tried numerous OTC creams in the efforts to treat the lesions.  This has been unsuccessful.  He even has tried to shave it off with a razor.  The lesion is itchy.  It has not appeared wart-like or ulcer in nature.  It has not changed since it first appeared.  It has not had a discharge or been painful.   He has not noted other areas on other parts of his body.  He is sexually active in a monogamous relationship.    After a two weeks trial with Mycolog cream, the lesion did not abate.  He underwent an in office penile biopsy last week with Dr. Erlene Quan.  He pulled the sutures out as they were bothering him.  He has not had significant pain.  He has not had penile swelling.  He has not had fevers, chills, nausea or vomiting.     PMH: Past Medical History:  Diagnosis Date  . ASCVD (arteriosclerotic cardiovascular disease)   . Cervical radiculopathy   . Chest pain, unspecified   . Depression   . Diabetes ([place])   . GAD (generalized anxiety disorder)   . GERD (gastroesophageal reflux disease)   . Gout   . Heart attack   . HLD (hyperlipidemia)   . Kidney stone   . Obesity   . Reflux     Surgical History: Past Surgical History:  Procedure Laterality Date  . ANTERIOR CRUCIATE LIGAMENT REPAIR    . APPENDECTOMY    . CHOLECYSTECTOMY    . CORONARY STENT PLACEMENT    . TONSILLECTOMY    . UPPER GI ENDOSCOPY      Home Medications:  Allergies as  of 10/08/2016      Reactions   Codeine Other (See Comments)   Cold sweats (30 years ago) but has taken since with no reaction      Medication List       Accurate as of 10/08/16  9:09 AM. Always use your most recent med list.          amoxicillin 250 MG capsule Commonly known as:  AMOXIL Take 250 mg by mouth 3 (three) times daily.   aspirin 81 MG tablet Take 81 mg by mouth daily.   atorvastatin 80 MG tablet Commonly known as:  LIPITOR Take 1 tablet (80 mg total) by mouth daily at 6 PM.   diphenhydrAMINE 25 mg capsule Commonly known as:  BENADRYL Take by mouth.   glucose blood test strip 1 each by Other route 2 (two) times daily as needed for other. Use as instructed   ibuprofen 800 MG tablet Commonly known as:  ADVIL,MOTRIN Take 1 tablet (800 mg total) by mouth every 8 (eight) hours as needed.   losartan 50 MG tablet Commonly known as:  COZAAR Take by mouth.   metoprolol 50 MG tablet Commonly known as:  LOPRESSOR Take 1 tablet (50 mg total) by  mouth daily.   metoprolol succinate 50 MG 24 hr tablet Commonly known as:  TOPROL-XL Take by mouth.   NITROSTAT 0.4 MG SL tablet Generic drug:  nitroGLYCERIN Reported on 02/04/2016   nystatin-triamcinolone ointment Commonly known as:  MYCOLOG Apply 1 application topically 2 (two) times daily.   ONETOUCH DELICA LANCETS 99991111 Misc 1 each by Does not apply route 2 (two) times daily as needed.   pantoprazole 40 MG tablet Commonly known as:  PROTONIX Take 1 tablet (40 mg total) by mouth 2 (two) times daily.   PARoxetine 40 MG tablet Commonly known as:  PAXIL Take 1 tablet (40 mg total) by mouth daily.   PLAVIX PO Take by mouth.   ZANTAC 75 PO Take by mouth.       Allergies:  Allergies  Allergen Reactions  . Codeine Other (See Comments)    Cold sweats (30 years ago) but has taken since with no reaction    Family History: Family History  Problem Relation Age of Onset  . Cancer Mother     lung  .  Stroke Mother   . Multiple sclerosis Mother   . Heart attack Father   . Bladder Cancer Maternal Grandfather   . Heart disease Maternal Grandfather   . Prostate cancer Neg Hx   . Kidney disease Neg Hx     Social History:  reports that he quit smoking about 13 years ago. His smoking use included Cigarettes. He has a 22.50 pack-year smoking history. His smokeless tobacco use includes Chew. He reports that he drinks about 2.4 oz of alcohol per week . He reports that he does not use drugs.  ROS: UROLOGY Frequent Urination?: No Hard to postpone urination?: No Burning/pain with urination?: No Get up at night to urinate?: No Leakage of urine?: No Urine stream starts and stops?: No Trouble starting stream?: No Do you have to strain to urinate?: No Blood in urine?: No Urinary tract infection?: No Sexually transmitted disease?: No Injury to kidneys or bladder?: No Painful intercourse?: No Weak stream?: No Erection problems?: No Penile pain?: No  Gastrointestinal Nausea?: No Vomiting?: No Indigestion/heartburn?: No Diarrhea?: No Constipation?: No  Constitutional Fever: No Night sweats?: No Weight loss?: No Fatigue?: No  Skin Skin rash/lesions?: No Itching?: No  Eyes Blurred vision?: No Double vision?: No  Ears/Nose/Throat Sore throat?: No Sinus problems?: No  Hematologic/Lymphatic Swollen glands?: No Easy bruising?: No  Cardiovascular Leg swelling?: No Chest pain?: No  Respiratory Cough?: No Shortness of breath?: No  Endocrine Excessive thirst?: No  Musculoskeletal Back pain?: No Joint pain?: No  Neurological Headaches?: No Dizziness?: No  Psychologic Depression?: No Anxiety?: No  Physical Exam: BP (!) 153/87   Pulse 70   Ht 5\' 4"  (1.626 m)   Wt 204 lb (92.5 kg)   BMI 35.02 kg/m   Constitutional: Well nourished. Alert and oriented, No acute distress. HEENT: [place] AT, moist mucus membranes. Trachea midline, no masses. Cardiovascular: No  clubbing, cyanosis, or edema. Respiratory: Normal respiratory effort, no increased work of breathing. GI: Abdomen is soft, non tender, non distended, no abdominal masses. Liver and spleen not palpable.  No hernias appreciated.  Stool sample for occult testing is not indicated.   GU: No CVA tenderness.  No bladder fullness or masses.  Patient with uncircumcised phallus. Foreskin easily retracted.  Urethral meatus is patent.  No penile discharge. A 5 mm x 3 mm wound with granulation tissue.  No erythema.  No discharge.  Scrotum without lesions, cysts, rashes and/or  edema.  Testicles are located scrotally bilaterally. No masses are appreciated in the testicles. Left and right epididymis are normal. Rectal: Deferred.   Skin: No rashes, bruises or suspicious lesions. Lymph: No cervical or inguinal adenopathy. Neurologic: Grossly intact, no focal deficits, moving all 4 extremities. Psychiatric: Normal mood and affect.  Laboratory Data: Lab Results  Component Value Date   WBC 9.0 08/17/2016   HGB 17.1 04/29/2015   HCT 46.4 08/17/2016   MCV 90 08/17/2016   PLT 208 08/17/2016    Lab Results  Component Value Date   CREATININE 1.02 08/17/2016    Lab Results  Component Value Date   PSA 1.3 04/24/2014       PSA                        1.2                                                                   07/17/2015      PSA                        1.4                                                                   08/17/2016   Lab Results  Component Value Date   HGBA1C 6.3 05/06/2016    Lab Results  Component Value Date   TSH 1.880 08/17/2016       Component Value Date/Time   CHOL 156 08/17/2016 1031   HDL 45 08/17/2016 1031   CHOLHDL 3.3 07/17/2015 1008   CHOLHDL 5.0 CALC 05/03/2007 1022   VLDL 40 05/03/2007 1022   LDLCALC 63 08/17/2016 1031    Lab Results  Component Value Date   AST 19 08/17/2016   Lab Results  Component Value Date   ALT 19 08/17/2016       Assessment & Plan:    1. Penile lesion  - pathology results are not available at this time  - wound healing well  - will contact the patient with the pathology results    No Follow-up on file.  These notes generated with voice recognition software. I apologize for typographical errors.  Zara Council, [facility] [address], [place], [place] [address] [phone]

## 2016-10-12 ENCOUNTER — Telehealth: Payer: Self-pay

## 2016-10-12 NOTE — Telephone Encounter (Signed)
Patient notified of results.

## 2016-10-12 NOTE — Telephone Encounter (Signed)
-----   Message from Nori Riis, PA-C sent at 10/11/2016  6:14 PM EST ----- Would you let the patient know that the lesion is benign, but he should see dermatology for definitive treatment as it may return.

## 2017-05-11 ENCOUNTER — Other Ambulatory Visit: Payer: Self-pay | Admitting: Family Medicine

## 2017-05-11 DIAGNOSIS — K219 Gastro-esophageal reflux disease without esophagitis: Secondary | ICD-10-CM

## 2017-06-29 ENCOUNTER — Other Ambulatory Visit: Payer: Self-pay | Admitting: Family Medicine

## 2017-06-29 DIAGNOSIS — I10 Essential (primary) hypertension: Secondary | ICD-10-CM

## 2017-06-29 DIAGNOSIS — E785 Hyperlipidemia, unspecified: Secondary | ICD-10-CM

## 2017-06-29 DIAGNOSIS — F32A Depression, unspecified: Secondary | ICD-10-CM

## 2017-06-29 DIAGNOSIS — F329 Major depressive disorder, single episode, unspecified: Secondary | ICD-10-CM

## 2017-07-25 DIAGNOSIS — I2510 Atherosclerotic heart disease of native coronary artery without angina pectoris: Secondary | ICD-10-CM | POA: Diagnosis not present

## 2017-07-25 DIAGNOSIS — I208 Other forms of angina pectoris: Secondary | ICD-10-CM | POA: Diagnosis not present

## 2017-07-25 DIAGNOSIS — E782 Mixed hyperlipidemia: Secondary | ICD-10-CM | POA: Diagnosis not present

## 2017-07-25 DIAGNOSIS — I10 Essential (primary) hypertension: Secondary | ICD-10-CM | POA: Diagnosis not present

## 2017-08-24 DIAGNOSIS — H2513 Age-related nuclear cataract, bilateral: Secondary | ICD-10-CM | POA: Diagnosis not present

## 2017-08-24 DIAGNOSIS — H43813 Vitreous degeneration, bilateral: Secondary | ICD-10-CM | POA: Diagnosis not present

## 2017-08-24 DIAGNOSIS — H25013 Cortical age-related cataract, bilateral: Secondary | ICD-10-CM | POA: Diagnosis not present

## 2017-08-24 DIAGNOSIS — H35033 Hypertensive retinopathy, bilateral: Secondary | ICD-10-CM | POA: Diagnosis not present

## 2017-08-24 DIAGNOSIS — H2512 Age-related nuclear cataract, left eye: Secondary | ICD-10-CM | POA: Diagnosis not present

## 2017-09-06 DIAGNOSIS — H25812 Combined forms of age-related cataract, left eye: Secondary | ICD-10-CM | POA: Diagnosis not present

## 2017-09-06 DIAGNOSIS — H2512 Age-related nuclear cataract, left eye: Secondary | ICD-10-CM | POA: Diagnosis not present

## 2017-09-14 DIAGNOSIS — H2512 Age-related nuclear cataract, left eye: Secondary | ICD-10-CM | POA: Diagnosis not present

## 2017-10-05 ENCOUNTER — Encounter (INDEPENDENT_AMBULATORY_CARE_PROVIDER_SITE_OTHER): Payer: Medicare Other | Admitting: Ophthalmology

## 2017-10-05 DIAGNOSIS — H43813 Vitreous degeneration, bilateral: Secondary | ICD-10-CM

## 2017-10-05 DIAGNOSIS — I10 Essential (primary) hypertension: Secondary | ICD-10-CM | POA: Diagnosis not present

## 2017-10-05 DIAGNOSIS — H35033 Hypertensive retinopathy, bilateral: Secondary | ICD-10-CM | POA: Diagnosis not present

## 2017-10-05 DIAGNOSIS — H59032 Cystoid macular edema following cataract surgery, left eye: Secondary | ICD-10-CM | POA: Diagnosis not present

## 2017-10-05 DIAGNOSIS — H2511 Age-related nuclear cataract, right eye: Secondary | ICD-10-CM | POA: Diagnosis not present

## 2017-10-05 DIAGNOSIS — H53412 Scotoma involving central area, left eye: Secondary | ICD-10-CM | POA: Diagnosis not present

## 2017-10-07 ENCOUNTER — Other Ambulatory Visit: Payer: Self-pay | Admitting: Family Medicine

## 2017-10-07 DIAGNOSIS — F329 Major depressive disorder, single episode, unspecified: Secondary | ICD-10-CM

## 2017-10-07 DIAGNOSIS — E785 Hyperlipidemia, unspecified: Secondary | ICD-10-CM

## 2017-10-07 DIAGNOSIS — I10 Essential (primary) hypertension: Secondary | ICD-10-CM

## 2017-10-07 DIAGNOSIS — F32A Depression, unspecified: Secondary | ICD-10-CM

## 2017-10-31 ENCOUNTER — Ambulatory Visit (INDEPENDENT_AMBULATORY_CARE_PROVIDER_SITE_OTHER): Payer: Medicare Other | Admitting: Family Medicine

## 2017-10-31 ENCOUNTER — Encounter: Payer: Self-pay | Admitting: Family Medicine

## 2017-10-31 VITALS — BP 146/78 | HR 58 | Temp 97.9°F | Resp 16 | Wt 212.0 lb

## 2017-10-31 DIAGNOSIS — K219 Gastro-esophageal reflux disease without esophagitis: Secondary | ICD-10-CM | POA: Diagnosis not present

## 2017-10-31 DIAGNOSIS — E782 Mixed hyperlipidemia: Secondary | ICD-10-CM

## 2017-10-31 DIAGNOSIS — I10 Essential (primary) hypertension: Secondary | ICD-10-CM

## 2017-10-31 DIAGNOSIS — F3342 Major depressive disorder, recurrent, in full remission: Secondary | ICD-10-CM | POA: Diagnosis not present

## 2017-10-31 DIAGNOSIS — J309 Allergic rhinitis, unspecified: Secondary | ICD-10-CM | POA: Diagnosis not present

## 2017-10-31 MED ORDER — ATORVASTATIN CALCIUM 80 MG PO TABS: ORAL_TABLET | ORAL | 3 refills | Status: DC

## 2017-10-31 MED ORDER — PANTOPRAZOLE SODIUM 40 MG PO TBEC
DELAYED_RELEASE_TABLET | ORAL | 1 refills | Status: DC
Start: 2017-10-31 — End: 2018-06-26

## 2017-10-31 MED ORDER — LORATADINE 10 MG PO TABS: 10.0000 mg | ORAL_TABLET | Freq: Every day | ORAL | 3 refills | Status: DC

## 2017-10-31 MED ORDER — METOPROLOL TARTRATE 50 MG PO TABS: 50.0000 mg | ORAL_TABLET | Freq: Every day | ORAL | 3 refills | Status: DC

## 2017-10-31 MED ORDER — PAROXETINE HCL 40 MG PO TABS: 40.0000 mg | ORAL_TABLET | Freq: Every day | ORAL | 3 refills | Status: DC

## 2017-10-31 NOTE — Progress Notes (Signed)
Patient: Michael Leon Male    DOB: 1951-05-22   67 y.o.   MRN: [id number] Visit Date: 10/31/2017  Today's Provider: Wilhemena Durie, MD   Chief Complaint  Patient presents with  . Hypertension  . Gastroesophageal Reflux   Subjective:    HPI  Hypertension, follow-up:  BP Readings from Last 3 Encounters:  10/31/17 (!) 146/78  10/08/16 (!) 153/87  10/01/16 (!) 156/82    He was last seen for hypertension 1 years ago.  BP at that visit was 153/87. Management since that visit includes none. He reports good compliance with treatment. He is not having side effects.  He is not exercising. He is adherent to low salt diet.   Outside blood pressures are not being checked. He is experiencing dyspnea.  Patient denies chest pain, chest pressure/discomfort, claudication, exertional chest pressure/discomfort, fatigue, irregular heart beat, lower extremity edema, near-syncope, orthopnea, palpitations, paroxysmal nocturnal dyspnea, syncope and tachypnea.   Wt Readings from Last 3 Encounters:  10/31/17 212 lb (96.2 kg)  10/08/16 204 lb (92.5 kg)  10/01/16 197 lb (89.4 kg)    ------------------------------------------------------------------------     Allergies  Allergen Reactions  . Codeine Other (See Comments)    Cold sweats (30 years ago) but has taken since with no reaction     Current Outpatient Medications:  .  aspirin 81 MG tablet, Take 81 mg by mouth daily., Disp: , Rfl:  .  NITROSTAT 0.4 MG SL tablet, Reported on 02/04/2016, Disp: , Rfl: 11 .  pantoprazole (PROTONIX) 40 MG tablet, TAKE 1 TABLET(40 MG) BY MOUTH TWICE DAILY, Disp: 180 tablet, Rfl: 1 .  atorvastatin (LIPITOR) 80 MG tablet, TAKE 1 TABLET BY MOUTH EVERY DAY AT 6 PM, Disp: 90 tablet, Rfl: 3 .  Clopidogrel Bisulfate (PLAVIX PO), Take by mouth., Disp: , Rfl:  .  diphenhydrAMINE (BENADRYL) 25 mg capsule, Take by mouth., Disp: , Rfl:  .  glucose blood test strip, 1 each by Other route 2 (two) times  daily as needed for other. Use as instructed, Disp: 100 each, Rfl: 12 .  ibuprofen (ADVIL,MOTRIN) 800 MG tablet, Take 1 tablet (800 mg total) by mouth every 8 (eight) hours as needed. (Patient not taking: Reported on 10/31/2017), Disp: 90 tablet, Rfl: 3 .  loratadine (CLARITIN) 10 MG tablet, Take 1 tablet (10 mg total) by mouth daily., Disp: 90 tablet, Rfl: 3 .  losartan (COZAAR) 50 MG tablet, Take by mouth., Disp: , Rfl:  .  metoprolol tartrate (LOPRESSOR) 50 MG tablet, Take 1 tablet (50 mg total) by mouth daily., Disp: 90 tablet, Rfl: 3 .  nystatin-triamcinolone ointment (MYCOLOG), Apply 1 application topically 2 (two) times daily. (Patient not taking: Reported on 10/31/2017), Disp: 30 g, Rfl: 0 .  ONETOUCH DELICA LANCETS 37T MISC, 1 each by Does not apply route 2 (two) times daily as needed., Disp: 100 each, Rfl: 12 .  PARoxetine (PAXIL) 40 MG tablet, Take 1 tablet (40 mg total) by mouth daily., Disp: 90 tablet, Rfl: 3 .  RaNITidine HCl (ZANTAC 75 PO), Take by mouth., Disp: , Rfl:   Review of Systems  Constitutional: Negative.   HENT: Positive for congestion.   Eyes: Negative.   Respiratory: Negative.   Cardiovascular: Negative.   Gastrointestinal: Negative.   Endocrine: Negative.   Genitourinary: Negative.   Musculoskeletal: Negative.   Skin: Negative.   Allergic/Immunologic: Negative.   Neurological: Positive for dizziness.  Hematological: Negative.   Psychiatric/Behavioral: The patient is nervous/anxious.  Social History   Tobacco Use  . Smoking status: Former Smoker    Packs/day: 1.50    Years: 15.00    Pack years: 22.50    Types: Cigarettes    Last attempt to quit: 10/18/2002    Years since quitting: 15.0  . Smokeless tobacco: Current User    Types: Chew  . Tobacco comment: Dips  Substance Use Topics  . Alcohol use: Yes    Alcohol/week: 2.4 oz    Types: 4 Cans of beer per week    Comment: 1-2 "lite" beers per day   Objective:   BP (!) 146/78 (BP Location: Left  Arm, Patient Position: Sitting, Cuff Size: Large)   Pulse (!) 58   Temp 97.9 F (36.6 C) (Oral)   Resp 16   Wt 212 lb (96.2 kg)   BMI 36.39 kg/m  Vitals:   10/31/17 0818  BP: (!) 146/78  Pulse: (!) 58  Resp: 16  Temp: 97.9 F (36.6 C)  TempSrc: Oral  Weight: 212 lb (96.2 kg)     Physical Exam  Constitutional: He is oriented to person, place, and time. He appears well-developed and well-nourished.  HENT:  Head: Normocephalic and atraumatic.  Right Ear: External ear normal.  Left Ear: External ear normal.  Nose: Nose normal.  Mouth/Throat: Oropharynx is clear and moist.  Eyes: Conjunctivae are normal.  Cardiovascular: Normal rate, regular rhythm and normal heart sounds.  Pulmonary/Chest: Effort normal and breath sounds normal.  Abdominal: Soft.  Neurological: He is alert and oriented to person, place, and time.  Skin: Skin is warm and dry.  Psychiatric: He has a normal mood and affect. His behavior is normal. Judgment and thought content normal.        Assessment & Plan:     1. Benign essential HTN  - metoprolol tartrate (LOPRESSOR) 50 MG tablet; Take 1 tablet (50 mg total) by mouth daily.  Dispense: 90 tablet; Refill: 3  2. Hyperlipidemia  - atorvastatin (LIPITOR) 80 MG tablet; TAKE 1 TABLET BY MOUTH EVERY DAY AT 6 PM  Dispense: 90 tablet; Refill: 3  3. Depression  - PARoxetine (PAXIL) 40 MG tablet; Take 1 tablet (40 mg total) by mouth daily.  Dispense: 90 tablet; Refill: 3  4. Allergic rhinitis, unspecified seasonality, unspecified trigger  - loratadine (CLARITIN) 10 MG tablet; Take 1 tablet (10 mg total) by mouth daily.  Dispense: 90 tablet; Refill: 3  5. Gastroesophageal reflux disease without esophagitis  - pantoprazole (PROTONIX) 40 MG tablet; TAKE 1 TABLET(40 MG) BY MOUTH TWICE DAILY  Dispense: 180 tablet; Refill: 1     HPI, Exam, and A&P Transcribed under the direction and in the presence of Richard L. Cranford Mon, MD  Electronically Signed:  Katina Dung, CMA  I have done the exam and reviewed the above chart and it is accurate to the best of my knowledge. Development worker, community has been used in this note in any air is in the dictation or transcription are unintentional.  Wilhemena Durie, MD  [place]

## 2017-11-16 ENCOUNTER — Encounter (INDEPENDENT_AMBULATORY_CARE_PROVIDER_SITE_OTHER): Payer: Medicare Other | Admitting: Ophthalmology

## 2017-11-16 DIAGNOSIS — H59032 Cystoid macular edema following cataract surgery, left eye: Secondary | ICD-10-CM | POA: Diagnosis not present

## 2017-11-16 DIAGNOSIS — H43813 Vitreous degeneration, bilateral: Secondary | ICD-10-CM

## 2017-11-16 DIAGNOSIS — H35033 Hypertensive retinopathy, bilateral: Secondary | ICD-10-CM | POA: Diagnosis not present

## 2017-11-16 DIAGNOSIS — I10 Essential (primary) hypertension: Secondary | ICD-10-CM | POA: Diagnosis not present

## 2017-11-29 ENCOUNTER — Encounter: Payer: Self-pay | Admitting: Family Medicine

## 2017-11-29 ENCOUNTER — Ambulatory Visit (INDEPENDENT_AMBULATORY_CARE_PROVIDER_SITE_OTHER): Payer: Medicare Other | Admitting: Family Medicine

## 2017-11-29 VITALS — BP 140/82 | HR 58 | Temp 97.8°F | Resp 16 | Ht 64.0 in | Wt 199.0 lb

## 2017-11-29 DIAGNOSIS — Z0000 Encounter for general adult medical examination without abnormal findings: Secondary | ICD-10-CM

## 2017-11-29 DIAGNOSIS — Z23 Encounter for immunization: Secondary | ICD-10-CM | POA: Diagnosis not present

## 2017-11-29 NOTE — Progress Notes (Addendum)
Patient: Michael Leon, Male    DOB: Sep 21, 1951, 67 y.o.   MRN: [id number] Visit Date: 11/29/2017  Today's Provider: Wilhemena Durie, MD   Chief Complaint  Patient presents with  . Medicare Wellness   Subjective:   Initial preventative physical exam  Michael Leon is a 67 y.o. male who presents today for his Initial Preventative Physical Exam. He feels well. He reports exercising formally by is active. He reports he is sleeping well.  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 Allen Norris.   Pt would like to discuss Prevnar vaccine with Dr. Rosanna Randy before getting it, he has never been sick or had a cold and does not want to get the vaccine.    Review of Systems  Constitutional: Negative.   HENT: Negative.   Eyes: Negative.   Respiratory: Negative.   Cardiovascular: Negative.   Gastrointestinal: Negative.   Endocrine: Negative.   Genitourinary: Negative.   Musculoskeletal: Negative.   Skin: Negative.   Allergic/Immunologic: Negative.   Neurological: Negative.   Hematological: Negative.   Psychiatric/Behavioral: Negative.     Social History   Socioeconomic History  . Marital status: Single    Spouse name: Not on file  . Number of children: Not on file  . Years of education: Not on file  . Highest education level: Not on file  Social Needs  . Financial resource strain: Not on file  . Food insecurity - worry: Not on file  . Food insecurity - inability: Not on file  . Transportation needs - medical: Not on file  . Transportation needs - non-medical: Not on file  Occupational History  . Occupation: Retired Agricultural consultant  Tobacco Use  . Smoking status: Former Smoker    Packs/day: 1.50    Years: 15.00    Pack years: 22.50    Types: Cigarettes    Last attempt to quit: 10/18/2002    Years since quitting: 15.1  . Smokeless tobacco: Current User    Types: Chew  . Tobacco comment: Dips  Substance and Sexual Activity  . Alcohol use: Yes    Alcohol/week: 2.4 oz   Types: 4 Cans of beer per week    Comment: 1-2 "lite" beers per day  . Drug use: No  . Sexual activity: Not on file  Other Topics Concern  . Not on file  Social History Narrative   Gets regular exercise - walking    Past Medical History:  Diagnosis Date  . ASCVD (arteriosclerotic cardiovascular disease)   . Cervical radiculopathy   . Chest pain, unspecified   . Depression   . Diabetes ([place])   . GAD (generalized anxiety disorder)   . GERD (gastroesophageal reflux disease)   . Gout   . Heart attack ([place])   . HLD (hyperlipidemia)   . Kidney stone   . Obesity   . Reflux      Patient Active Problem List   Diagnosis Date Noted  . Breathlessness on exertion 06/05/2015  . Dizziness 05/08/2015  . Arteriosclerosis of coronary artery 05/07/2015  . HLD (hyperlipidemia) 05/07/2015  . BP (high blood pressure) 05/07/2015  . Obesity 05/07/2015  . Depression 05/07/2015  . GAD (generalized anxiety disorder) 05/07/2015  . Insomnia 05/07/2015  . GERD (gastroesophageal reflux disease) 05/07/2015  . Gout 05/07/2015  . Cervical radiculopathy 05/07/2015  . Prediabetes 05/07/2015  . Tobacco use 05/07/2015  . History of kidney stones 05/07/2015  . Benign essential HTN 05/05/2015  . Anxiety 04/30/2015  . Borderline diabetes  04/30/2015  . ST elevation (STEMI) myocardial infarction ([place]) 04/30/2015  . Blood glucose elevated 04/30/2015  . CHEST PAIN UNSPECIFIED 06/19/2009    Past Surgical History:  Procedure Laterality Date  . ANTERIOR CRUCIATE LIGAMENT REPAIR    . APPENDECTOMY    . CHOLECYSTECTOMY    . CORONARY STENT PLACEMENT    . TONSILLECTOMY    . UPPER GI ENDOSCOPY      His family history includes Bladder Cancer in his maternal grandfather; Cancer in his mother; Heart attack in his father; Heart disease in his maternal grandfather; Multiple sclerosis in his mother; Stroke in his mother. There is no history of Prostate cancer or Kidney disease.      Current Outpatient  Medications:  .  aspirin 81 MG tablet, Take 81 mg by mouth daily., Disp: , Rfl:  .  Clopidogrel Bisulfate (PLAVIX PO), Take by mouth., Disp: , Rfl:  .  glucose blood test strip, 1 each by Other route 2 (two) times daily as needed for other. Use as instructed, Disp: 100 each, Rfl: 12 .  NITROSTAT 0.4 MG SL tablet, Reported on 02/04/2016, Disp: , Rfl: 11 .  pantoprazole (PROTONIX) 40 MG tablet, TAKE 1 TABLET(40 MG) BY MOUTH TWICE DAILY, Disp: 180 tablet, Rfl: 1 .  PARoxetine (PAXIL) 40 MG tablet, Take 1 tablet (40 mg total) by mouth daily., Disp: 90 tablet, Rfl: 3 .  atorvastatin (LIPITOR) 80 MG tablet, TAKE 1 TABLET BY MOUTH EVERY DAY AT 6 PM (Patient not taking: Reported on 11/29/2017), Disp: 90 tablet, Rfl: 3 .  diphenhydrAMINE (BENADRYL) 25 mg capsule, Take by mouth., Disp: , Rfl:  .  ibuprofen (ADVIL,MOTRIN) 800 MG tablet, Take 1 tablet (800 mg total) by mouth every 8 (eight) hours as needed. (Patient not taking: Reported on 10/31/2017), Disp: 90 tablet, Rfl: 3 .  loratadine (CLARITIN) 10 MG tablet, Take 1 tablet (10 mg total) by mouth daily. (Patient not taking: Reported on 11/29/2017), Disp: 90 tablet, Rfl: 3 .  losartan (COZAAR) 50 MG tablet, Take by mouth., Disp: , Rfl:  .  metoprolol tartrate (LOPRESSOR) 50 MG tablet, Take 1 tablet (50 mg total) by mouth daily., Disp: 90 tablet, Rfl: 3 .  nystatin-triamcinolone ointment (MYCOLOG), Apply 1 application topically 2 (two) times daily. (Patient not taking: Reported on 10/31/2017), Disp: 30 g, Rfl: 0 .  ONETOUCH DELICA LANCETS 70J MISC, 1 each by Does not apply route 2 (two) times daily as needed., Disp: 100 each, Rfl: 12 .  RaNITidine HCl (ZANTAC 75 PO), Take by mouth., Disp: , Rfl:    Patient Care Team: Jerrol Banana., MD as PCP - General (Family Medicine)      Objective:   Vitals: BP 140/82 (BP Location: Left Arm, Patient Position: Sitting, Cuff Size: Large)   Pulse (!) 58   Temp 97.8 F (36.6 C) (Oral)   Resp 16   Ht 5\' 4"  (1.626  m)   Wt 199 lb (90.3 kg)   BMI 34.16 kg/m   Physical Exam  Constitutional: He is oriented to person, place, and time. He appears well-developed and well-nourished.  HENT:  Head: Normocephalic and atraumatic.  Right Ear: External ear normal.  Left Ear: External ear normal.  Nose: Nose normal.  Mouth/Throat: Oropharynx is clear and moist.  Eyes: Conjunctivae are normal. No scleral icterus.  Neck: No thyromegaly present.  Cardiovascular: Normal rate, regular rhythm, normal heart sounds and intact distal pulses.  Pulmonary/Chest: Effort normal and breath sounds normal.  Abdominal: Soft.  Genitourinary: Rectum  normal, prostate normal and penis normal.  Musculoskeletal: Normal range of motion.  Neurological: He is alert and oriented to person, place, and time.  Skin: Skin is warm and dry.  Psychiatric: He has a normal mood and affect. His behavior is normal. Judgment and thought content normal.     No exam data present  Activities of Daily Living In your present state of health, do you have any difficulty performing the following activities: 11/29/2017 10/31/2017  Hearing? N N  Vision? N N  Difficulty concentrating or making decisions? N N  Walking or climbing stairs? N N  Dressing or bathing? N N  Doing errands, shopping? N N  Some recent data might be hidden    Fall Risk Assessment Fall Risk  11/29/2017 10/31/2017 10/31/2017 08/17/2016 03/08/2016  Falls in the past year? No Yes Yes No (No Data)  Comment - - - - no falls in past week.  Number falls in past yr: - 2 or more 2 or more - -  Injury with Fall? - No No - -  Follow up - - Education provided - -     Depression Screen PHQ 2/9 Scores 11/29/2017 10/31/2017 10/31/2017 08/17/2016  PHQ - 2 Score 0 0 1 0  PHQ- 9 Score - 1 - -    Cognitive Testing - 6-CIT   Correct? Score   What year is it? yes 0 0 or 4  What month is it? yes 0 0 or 3  Memorize:    Pia Mau,  43,  [address]  [place],      What time is it? (within 1  hour) yes 0 0 or 3  Count backwards from 20 yes 0 0, 2, or 4  Name the months of the year yes 0 0, 2, or 4  Repeat name & address above no 7 0, 2, 4, 6, 8, or 10       TOTAL SCORE  7/28   Interpretation:  Normal  Normal (0-7) Abnormal (8-28)      Assessment & Plan:     Initial Preventative Physical Exam  Reviewed patient's Family Medical History Reviewed and updated list of patient's medical providers Assessment of cognitive impairment was done Assessed patient's functional ability Established a written schedule for health screening [place] Completed and Reviewed  Exercise Activities and Dietary recommendations Goals    None      Immunization History  Administered Date(s) Administered  . Influenza-Unspecified 10/23/2015  . Tdap 03/14/2013  . Varicella Zoster Immune Globulin 07/17/2015    Health Maintenance  Topic Date Due  . FOOT EXAM  07/31/1962  . OPHTHALMOLOGY EXAM  07/31/1962  . HIV Screening  08/01/1967  . HEMOGLOBIN A1C  11/06/2016  . PNA vac Low Risk Adult (1 of 2 - PCV13) 07/31/2017  . INFLUENZA VACCINE  05/18/2018 (Originally 05/18/2017)  . TETANUS/TDAP  03/15/2023  . COLONOSCOPY  12/26/2023  . Hepatitis C Screening  Completed     Discussed health benefits of physical activity, and encouraged him to engage in regular exercise appropriate for his age and condition.  RTC 1-2 months.   ------------------------------------------------------------------------------------------------------------ I have done the exam and reviewed the chart and it is accurate to the best of my knowledge. Development worker, community has been used and  any errors in dictation or transcription are unintentional. Miguel Aschoff M.D. [place], MD  [facility]

## 2017-12-22 ENCOUNTER — Ambulatory Visit (INDEPENDENT_AMBULATORY_CARE_PROVIDER_SITE_OTHER): Payer: Medicare Other | Admitting: Family Medicine

## 2017-12-22 ENCOUNTER — Other Ambulatory Visit: Payer: Self-pay

## 2017-12-22 VITALS — BP 150/90 | HR 64 | Temp 98.6°F | Resp 16 | Wt 204.0 lb

## 2017-12-22 DIAGNOSIS — I10 Essential (primary) hypertension: Secondary | ICD-10-CM

## 2017-12-22 DIAGNOSIS — E785 Hyperlipidemia, unspecified: Secondary | ICD-10-CM

## 2017-12-22 DIAGNOSIS — R7303 Prediabetes: Secondary | ICD-10-CM | POA: Diagnosis not present

## 2017-12-22 DIAGNOSIS — N401 Enlarged prostate with lower urinary tract symptoms: Secondary | ICD-10-CM

## 2017-12-22 NOTE — Progress Notes (Signed)
Michael Leon  MRN: [id number] DOB: [birth year]  Subjective:  HPI   The patient is a 67 year old male who presents for follow up after his welcome to Medicare  Patient Active Problem List   Diagnosis Date Noted  . Breathlessness on exertion 06/05/2015  . Dizziness 05/08/2015  . Arteriosclerosis of coronary artery 05/07/2015  . HLD (hyperlipidemia) 05/07/2015  . BP (high blood pressure) 05/07/2015  . Obesity 05/07/2015  . Depression 05/07/2015  . GAD (generalized anxiety disorder) 05/07/2015  . Insomnia 05/07/2015  . GERD (gastroesophageal reflux disease) 05/07/2015  . Gout 05/07/2015  . Cervical radiculopathy 05/07/2015  . Prediabetes 05/07/2015  . Tobacco use 05/07/2015  . History of kidney stones 05/07/2015  . Benign essential HTN 05/05/2015  . Anxiety 04/30/2015  . Borderline diabetes 04/30/2015  . ST elevation (STEMI) myocardial infarction ([place]) 04/30/2015  . Blood glucose elevated 04/30/2015  . CHEST PAIN UNSPECIFIED 06/19/2009    Past Medical History:  Diagnosis Date  . ASCVD (arteriosclerotic cardiovascular disease)   . Cervical radiculopathy   . Chest pain, unspecified   . Depression   . Diabetes ([place])   . GAD (generalized anxiety disorder)   . GERD (gastroesophageal reflux disease)   . Gout   . Heart attack ([place])   . HLD (hyperlipidemia)   . Kidney stone   . Obesity   . Reflux     Social History   Socioeconomic History  . Marital status: Single    Spouse name: Not on file  . Number of children: Not on file  . Years of education: Not on file  . Highest education level: Not on file  Social Needs  . Financial resource strain: Not on file  . Food insecurity - worry: Not on file  . Food insecurity - inability: Not on file  . Transportation needs - medical: Not on file  . Transportation needs - non-medical: Not on file  Occupational History  . Occupation: Retired Agricultural consultant  Tobacco Use  . Smoking status: Former Smoker    Packs/day: 1.50   Years: 15.00    Pack years: 22.50    Types: Cigarettes    Last attempt to quit: 10/18/2002    Years since quitting: 15.1  . Smokeless tobacco: Current User    Types: Chew  . Tobacco comment: Dips  Substance and Sexual Activity  . Alcohol use: Yes    Alcohol/week: 2.4 oz    Types: 4 Cans of beer per week    Comment: 1-2 "lite" beers per day  . Drug use: No  . Sexual activity: Not on file  Other Topics Concern  . Not on file  Social History Narrative   Gets regular exercise - walking    Outpatient Encounter Medications as of 12/22/2017  Medication Sig Note  . aspirin 81 MG tablet Take 81 mg by mouth daily.   Marland Kitchen glucose blood test strip 1 each by Other route 2 (two) times daily as needed for other. Use as instructed   . ibuprofen (ADVIL,MOTRIN) 800 MG tablet Take 1 tablet (800 mg total) by mouth every 8 (eight) hours as needed.   . metoprolol tartrate (LOPRESSOR) 50 MG tablet Take 1 tablet (50 mg total) by mouth daily.   Marland Kitchen NITROSTAT 0.4 MG SL tablet Reported on 02/04/2016 05/08/2015: Received from: External Pharmacy  . ONETOUCH DELICA LANCETS 19J MISC 1 each by Does not apply route 2 (two) times daily as needed.   . pantoprazole (PROTONIX) 40 MG tablet TAKE 1 TABLET(40  MG) BY MOUTH TWICE DAILY   . PARoxetine (PAXIL) 40 MG tablet Take 1 tablet (40 mg total) by mouth daily.   Marland Kitchen Clopidogrel Bisulfate (PLAVIX PO) Take by mouth. 08/31/2016: Received from: [facility] Received Sig: Take by mouth.  . losartan (COZAAR) 50 MG tablet Take by mouth. 08/31/2016: Received from: [place]: Take 1 tablet (50 mg total) by mouth once daily.  Marland Kitchen nystatin-triamcinolone ointment (MYCOLOG) Apply 1 application topically 2 (two) times daily. (Patient not taking: Reported on 10/31/2017) 10/01/2016: Last used two days ago  . [DISCONTINUED] atorvastatin (LIPITOR) 80 MG tablet TAKE 1 TABLET BY MOUTH EVERY DAY AT 6 PM (Patient not taking: Reported on 11/29/2017)   .  [DISCONTINUED] diphenhydrAMINE (BENADRYL) 25 mg capsule Take by mouth. 08/31/2016: Received from: [facility]: Take 25-50 mg by mouth every 4 (four) hours as needed. Prn itching  . [DISCONTINUED] loratadine (CLARITIN) 10 MG tablet Take 1 tablet (10 mg total) by mouth daily. (Patient not taking: Reported on 11/29/2017)   . [DISCONTINUED] RaNITidine HCl (ZANTAC 75 PO) Take by mouth. 08/31/2016: Received from: [facility] Received Sig: Take by mouth.   No facility-administered encounter medications on file as of 12/22/2017.     Allergies  Allergen Reactions  . Codeine Other (See Comments)    Cold sweats (30 years ago) but has taken since with no reaction    Review of Systems  Constitutional: Negative for fever and malaise/fatigue.  HENT: Negative.   Eyes: Negative.   Respiratory: Negative for cough, shortness of breath and wheezing.   Cardiovascular: Negative for chest pain, palpitations, orthopnea, claudication and leg swelling.  Gastrointestinal: Negative.   Skin: Negative.   Neurological: Negative for weakness.  Endo/Heme/Allergies: Negative.   Psychiatric/Behavioral: Negative.     Objective:  BP (!) 150/90 (BP Location: Right Arm, Patient Position: Sitting, Cuff Size: Normal)   Pulse 64   Temp 98.6 F (37 C) (Oral)   Resp 16   Wt 204 lb (92.5 kg)   BMI 35.02 kg/m   Physical Exam  Constitutional: He is oriented to person, place, and time and well-developed, well-nourished, and in no distress.  HENT:  Head: Normocephalic and atraumatic.  Eyes: Conjunctivae are normal.  Neck: No thyromegaly present.  Cardiovascular: Normal rate, regular rhythm and normal heart sounds.  Pulmonary/Chest: Effort normal and breath sounds normal.  Neurological: He is alert and oriented to person, place, and time.  Skin: Skin is warm and dry.  Psychiatric: Memory, affect and judgment normal.    Assessment and Plan :  1. Benign essential  HTN  - CBC with Differential/Platelet - TSH - POCT urinalysis dipstick  2. Borderline diabetes   3. Hyperlipidemia, unspecified hyperlipidemia type  - Lipid panel - Comprehensive metabolic panel  4. Benign prostatic hyperplasia with lower urinary tract symptoms, symptom details unspecified  - PSA - POCT urinalysis dipstick  I have done the exam and reviewed the chart and it is accurate to the best of my knowledge. Development worker, community has been used and  any errors in dictation or transcription are unintentional. Miguel Aschoff M.D. [facility]

## 2017-12-23 LAB — CBC WITH DIFFERENTIAL/PLATELET
Basophils Absolute: 0 10*3/uL (ref 0.0–0.2)
Basos: 0 %
EOS (ABSOLUTE): 0.2 10*3/uL (ref 0.0–0.4)
Eos: 4 %
Hematocrit: 48.6 % (ref 37.5–51.0)
Hemoglobin: 16.8 g/dL (ref 13.0–17.7)
Immature Grans (Abs): 0 10*3/uL (ref 0.0–0.1)
Immature Granulocytes: 0 %
Lymphocytes Absolute: 2.1 10*3/uL (ref 0.7–3.1)
Lymphs: 36 %
MCH: 31.7 pg (ref 26.6–33.0)
MCHC: 34.6 g/dL (ref 31.5–35.7)
MCV: 92 fL (ref 79–97)
Monocytes Absolute: 0.5 10*3/uL (ref 0.1–0.9)
Monocytes: 9 %
Neutrophils Absolute: 3 10*3/uL (ref 1.4–7.0)
Neutrophils: 51 %
Platelets: 239 10*3/uL (ref 150–379)
RBC: 5.3 x10E6/uL (ref 4.14–5.80)
RDW: 14.8 % (ref 12.3–15.4)
WBC: 5.8 10*3/uL (ref 3.4–10.8)

## 2017-12-23 LAB — COMPREHENSIVE METABOLIC PANEL
ALT: 22 IU/L (ref 0–44)
AST: 21 IU/L (ref 0–40)
Albumin/Globulin Ratio: 1.9 (ref 1.2–2.2)
Albumin: 4.4 g/dL (ref 3.6–4.8)
Alkaline Phosphatase: 60 IU/L (ref 39–117)
BUN/Creatinine Ratio: 11 (ref 10–24)
BUN: 11 mg/dL (ref 8–27)
Bilirubin Total: 0.6 mg/dL (ref 0.0–1.2)
CO2: 22 mmol/L (ref 20–29)
Calcium: 10 mg/dL (ref 8.6–10.2)
Chloride: 102 mmol/L (ref 96–106)
Creatinine, Ser: 0.98 mg/dL (ref 0.76–1.27)
GFR calc Af Amer: 93 mL/min/{1.73_m2} (ref >59)
GFR calc non Af Amer: 81 mL/min/{1.73_m2} (ref >59)
Globulin, Total: 2.3 g/dL (ref 1.5–4.5)
Glucose: 107 mg/dL — ABNORMAL HIGH (ref 65–99)
Potassium: 4.4 mmol/L (ref 3.5–5.2)
Sodium: 139 mmol/L (ref 134–144)
Total Protein: 6.7 g/dL (ref 6.0–8.5)

## 2017-12-23 LAB — LIPID PANEL
Chol/HDL Ratio: 4.9 ratio (ref 0.0–5.0)
Cholesterol, Total: 231 mg/dL — ABNORMAL HIGH (ref 100–199)
HDL: 47 mg/dL (ref >39)
LDL Calculated: 151 mg/dL — ABNORMAL HIGH (ref 0–99)
Triglycerides: 166 mg/dL — ABNORMAL HIGH (ref 0–149)
VLDL Cholesterol Cal: 33 mg/dL (ref 5–40)

## 2017-12-23 LAB — TSH: TSH: 1.36 u[IU]/mL (ref 0.450–4.500)

## 2017-12-23 LAB — PSA: Prostate Specific Ag, Serum: 1.8 ng/mL (ref 0.0–4.0)

## 2018-01-04 ENCOUNTER — Encounter (INDEPENDENT_AMBULATORY_CARE_PROVIDER_SITE_OTHER): Payer: Medicare Other | Admitting: Ophthalmology

## 2018-01-04 DIAGNOSIS — H35033 Hypertensive retinopathy, bilateral: Secondary | ICD-10-CM

## 2018-01-04 DIAGNOSIS — H2511 Age-related nuclear cataract, right eye: Secondary | ICD-10-CM

## 2018-01-04 DIAGNOSIS — I10 Essential (primary) hypertension: Secondary | ICD-10-CM

## 2018-01-04 DIAGNOSIS — H43813 Vitreous degeneration, bilateral: Secondary | ICD-10-CM

## 2018-02-24 DIAGNOSIS — I2510 Atherosclerotic heart disease of native coronary artery without angina pectoris: Secondary | ICD-10-CM | POA: Diagnosis not present

## 2018-02-24 DIAGNOSIS — I10 Essential (primary) hypertension: Secondary | ICD-10-CM | POA: Diagnosis not present

## 2018-02-24 DIAGNOSIS — E782 Mixed hyperlipidemia: Secondary | ICD-10-CM | POA: Diagnosis not present

## 2018-02-27 ENCOUNTER — Other Ambulatory Visit: Payer: Self-pay

## 2018-02-27 DIAGNOSIS — E785 Hyperlipidemia, unspecified: Secondary | ICD-10-CM

## 2018-03-03 DIAGNOSIS — H2511 Age-related nuclear cataract, right eye: Secondary | ICD-10-CM | POA: Diagnosis not present

## 2018-03-24 DIAGNOSIS — H35033 Hypertensive retinopathy, bilateral: Secondary | ICD-10-CM | POA: Diagnosis not present

## 2018-03-24 DIAGNOSIS — Z961 Presence of intraocular lens: Secondary | ICD-10-CM | POA: Diagnosis not present

## 2018-03-24 DIAGNOSIS — H5310 Unspecified subjective visual disturbances: Secondary | ICD-10-CM | POA: Diagnosis not present

## 2018-03-24 DIAGNOSIS — H53412 Scotoma involving central area, left eye: Secondary | ICD-10-CM | POA: Diagnosis not present

## 2018-03-24 DIAGNOSIS — H01009 Unspecified blepharitis unspecified eye, unspecified eyelid: Secondary | ICD-10-CM | POA: Diagnosis not present

## 2018-03-24 DIAGNOSIS — H353121 Nonexudative age-related macular degeneration, left eye, early dry stage: Secondary | ICD-10-CM | POA: Diagnosis not present

## 2018-06-26 ENCOUNTER — Ambulatory Visit
Admission: RE | Admit: 2018-06-26 | Discharge: 2018-06-26 | Disposition: A | Discharge status: Home / Self Care | Payer: Medicare Other | Source: Ambulatory Visit | Attending: Family Medicine | Admitting: Family Medicine

## 2018-06-26 ENCOUNTER — Ambulatory Visit (INDEPENDENT_AMBULATORY_CARE_PROVIDER_SITE_OTHER): Payer: Medicare Other | Admitting: Family Medicine

## 2018-06-26 VITALS — BP 148/82 | HR 72 | Temp 98.0°F | Resp 16 | Wt 206.0 lb

## 2018-06-26 DIAGNOSIS — F3342 Major depressive disorder, recurrent, in full remission: Secondary | ICD-10-CM | POA: Diagnosis not present

## 2018-06-26 DIAGNOSIS — M48061 Spinal stenosis, lumbar region without neurogenic claudication: Secondary | ICD-10-CM | POA: Insufficient documentation

## 2018-06-26 DIAGNOSIS — M545 Low back pain: Secondary | ICD-10-CM | POA: Insufficient documentation

## 2018-06-26 DIAGNOSIS — E785 Hyperlipidemia, unspecified: Secondary | ICD-10-CM | POA: Diagnosis not present

## 2018-06-26 DIAGNOSIS — K219 Gastro-esophageal reflux disease without esophagitis: Secondary | ICD-10-CM | POA: Diagnosis not present

## 2018-06-26 DIAGNOSIS — I213 ST elevation (STEMI) myocardial infarction of unspecified site: Secondary | ICD-10-CM

## 2018-06-26 DIAGNOSIS — R739 Hyperglycemia, unspecified: Secondary | ICD-10-CM | POA: Diagnosis not present

## 2018-06-26 DIAGNOSIS — I10 Essential (primary) hypertension: Secondary | ICD-10-CM | POA: Diagnosis not present

## 2018-06-26 DIAGNOSIS — M4316 Spondylolisthesis, lumbar region: Secondary | ICD-10-CM | POA: Insufficient documentation

## 2018-06-26 MED ORDER — PANTOPRAZOLE SODIUM 40 MG PO TBEC: DELAYED_RELEASE_TABLET | ORAL | 1 refills | Status: DC

## 2018-06-26 MED ORDER — NAPROXEN 500 MG PO TABS: 500.0000 mg | ORAL_TABLET | Freq: Two times a day (BID) | ORAL | 0 refills | Status: DC

## 2018-06-26 MED ORDER — CYCLOBENZAPRINE HCL 10 MG PO TABS: 10.0000 mg | ORAL_TABLET | Freq: Three times a day (TID) | ORAL | 0 refills | Status: DC | PRN

## 2018-06-26 MED ORDER — PAROXETINE HCL 40 MG PO TABS: 40.0000 mg | ORAL_TABLET | Freq: Every day | ORAL | 3 refills | Status: DC

## 2018-06-26 MED ORDER — LOSARTAN POTASSIUM 50 MG PO TABS: 50.0000 mg | ORAL_TABLET | Freq: Every day | ORAL | 3 refills | Status: AC

## 2018-06-26 MED ORDER — CLOPIDOGREL BISULFATE 75 MG PO TABS: 75.0000 mg | ORAL_TABLET | Freq: Once | ORAL | 3 refills | Status: AC

## 2018-06-26 MED ORDER — METOPROLOL TARTRATE 50 MG PO TABS: 50.0000 mg | ORAL_TABLET | Freq: Every day | ORAL | 3 refills | Status: DC

## 2018-06-26 NOTE — Progress Notes (Signed)
Michael Leon  MRN: [id number] DOB: 1951/03/29  Subjective:  HPI   The patient is a 67 year old male who presents for follow up of chronic health.  He was last seen on 12/22/17.    Borderline diabetes-Patient had glucose of 107 on last labs.  He has not had an A1C is a few years.  The last one was 2017 and it was 6.3.  Hypercholesterolemia-patient was supposed to have his labs rechecked in May but they have not yet been done.  Will reorder them today.  Hypertension BP Readings from Last 3 Encounters:  06/26/18 (!) 148/82  12/22/17 (!) 150/90  11/29/17 140/82    Patient Active Problem List   Diagnosis Date Noted  . Breathlessness on exertion 06/05/2015  . Dizziness 05/08/2015  . Arteriosclerosis of coronary artery 05/07/2015  . HLD (hyperlipidemia) 05/07/2015  . BP (high blood pressure) 05/07/2015  . Obesity 05/07/2015  . Depression 05/07/2015  . GAD (generalized anxiety disorder) 05/07/2015  . Insomnia 05/07/2015  . GERD (gastroesophageal reflux disease) 05/07/2015  . Gout 05/07/2015  . Cervical radiculopathy 05/07/2015  . Prediabetes 05/07/2015  . Tobacco use 05/07/2015  . History of kidney stones 05/07/2015  . Benign essential HTN 05/05/2015  . Anxiety 04/30/2015  . Borderline diabetes 04/30/2015  . ST elevation (STEMI) myocardial infarction ([place]) 04/30/2015  . Blood glucose elevated 04/30/2015  . CHEST PAIN UNSPECIFIED 06/19/2009    Past Medical History:  Diagnosis Date  . ASCVD (arteriosclerotic cardiovascular disease)   . Cervical radiculopathy   . Chest pain, unspecified   . Depression   . Diabetes ([place])   . GAD (generalized anxiety disorder)   . GERD (gastroesophageal reflux disease)   . Gout   . Heart attack ([place])   . HLD (hyperlipidemia)   . Kidney stone   . Obesity   . Reflux     Social History   Socioeconomic History  . Marital status: Single    Spouse name: Not on file  . Number of children: Not on file  . Years of education: Not on  file  . Highest education level: Not on file  Occupational History  . Occupation: Retired Technical brewer  . Financial resource strain: Not on file  . Food insecurity:    Worry: Not on file    Inability: Not on file  . Transportation needs:    Medical: Not on file    Non-medical: Not on file  Tobacco Use  . Smoking status: Former Smoker    Packs/day: 1.50    Years: 15.00    Pack years: 22.50    Types: Cigarettes    Last attempt to quit: 10/18/2002    Years since quitting: 15.6  . Smokeless tobacco: Current User    Types: Chew  . Tobacco comment: Dips  Substance and Sexual Activity  . Alcohol use: Yes    Alcohol/week: 4.0 standard drinks    Types: 4 Cans of beer per week    Comment: 1-2 "lite" beers per day  . Drug use: No  . Sexual activity: Not on file  Lifestyle  . Physical activity:    Days per week: Not on file    Minutes per session: Not on file  . Stress: Not on file  Relationships  . Social connections:    Talks on phone: Not on file    Gets together: Not on file    Attends religious service: Not on file    Active member of club or  organization: Not on file    Attends meetings of clubs or organizations: Not on file    Relationship status: Not on file  . Intimate partner violence:    Fear of current or ex partner: Not on file    Emotionally abused: Not on file    Physically abused: Not on file    Forced sexual activity: Not on file  Other Topics Concern  . Not on file  Social History Narrative   Gets regular exercise - walking    Outpatient Encounter Medications as of 06/26/2018  Medication Sig Note  . aspirin 81 MG tablet Take 81 mg by mouth daily.   Marland Kitchen Clopidogrel Bisulfate (PLAVIX PO) Take by mouth. 08/31/2016: Received from: [facility] Received Sig: Take by mouth.  Marland Kitchen glucose blood test strip 1 each by Other route 2 (two) times daily as needed for other. Use as instructed   . ibuprofen (ADVIL,MOTRIN) 800 MG tablet Take 1  tablet (800 mg total) by mouth every 8 (eight) hours as needed.   Marland Kitchen losartan (COZAAR) 50 MG tablet Take by mouth. 08/31/2016: Received from: [facility]: Take 1 tablet (50 mg total) by mouth once daily.  . metoprolol tartrate (LOPRESSOR) 50 MG tablet Take 1 tablet (50 mg total) by mouth daily.   Marland Kitchen NITROSTAT 0.4 MG SL tablet Reported on 02/04/2016 05/08/2015: Received from: External Pharmacy  . nystatin-triamcinolone ointment (MYCOLOG) Apply 1 application topically 2 (two) times daily. 10/01/2016: Last used two days ago  . ONETOUCH DELICA LANCETS 78H MISC 1 each by Does not apply route 2 (two) times daily as needed.   . pantoprazole (PROTONIX) 40 MG tablet TAKE 1 TABLET(40 MG) BY MOUTH TWICE DAILY   . PARoxetine (PAXIL) 40 MG tablet Take 1 tablet (40 mg total) by mouth daily.    No facility-administered encounter medications on file as of 06/26/2018.     Allergies  Allergen Reactions  . Codeine Other (See Comments)    Cold sweats (30 years ago) but has taken since with no reaction    Review of Systems  Constitutional: Negative for fever and malaise/fatigue.  Eyes: Negative.   Respiratory: Negative for cough, shortness of breath and wheezing.   Cardiovascular: Negative for chest pain, palpitations, orthopnea, claudication and leg swelling.  Gastrointestinal: Negative.   Genitourinary: Negative.   Musculoskeletal: Positive for back pain.  Neurological: Negative.   Endo/Heme/Allergies: Negative.   Psychiatric/Behavioral: Negative.     Objective:  BP (!) 148/82 (BP Location: Right Arm, Patient Position: Sitting, Cuff Size: Normal)   Pulse 72   Temp 98 F (36.7 C) (Oral)   Resp 16   Wt 206 lb (93.4 kg)   BMI 35.36 kg/m   Physical Exam  Constitutional: He is oriented to person, place, and time and well-developed, well-nourished, and in no distress.  HENT:  Head: Normocephalic and atraumatic.  Right Ear: External ear normal.  Left Ear: External ear  normal.  Nose: Nose normal.  Eyes: Conjunctivae are normal. No scleral icterus.  Neck: No thyromegaly present.  Cardiovascular: Normal rate, regular rhythm and normal heart sounds.  Pulmonary/Chest: Effort normal and breath sounds normal.  Abdominal: Soft.  Musculoskeletal: He exhibits no edema.  Neurological: He is alert and oriented to person, place, and time. He has normal reflexes. He exhibits normal muscle tone. Gait normal. Coordination normal. GCS score is 15.  Skin: Skin is warm and dry.  Psychiatric: Mood, memory, affect and judgment normal.    Assessment  and Plan :  1. Benign essential HTN  - losartan (COZAAR) 50 MG tablet; Take 1 tablet (50 mg total) by mouth daily.  Dispense: 90 tablet; Refill: 3 - metoprolol tartrate (LOPRESSOR) 50 MG tablet; Take 1 tablet (50 mg total) by mouth daily.  Dispense: 90 tablet; Refill: 3  2. Gastroesophageal reflux disease without esophagitis  - pantoprazole (PROTONIX) 40 MG tablet; TAKE 1 TABLET(40 MG) BY MOUTH TWICE DAILY  Dispense: 180 tablet; Refill: 1  3. Recurrent major depressive disorder, in full remission ([place])  - PARoxetine (PAXIL) 40 MG tablet; Take 1 tablet (40 mg total) by mouth daily.  Dispense: 90 tablet; Refill: 3  4. Gastroesophageal reflux disease, esophagitis presence not specified  - pantoprazole (PROTONIX) 40 MG tablet; TAKE 1 TABLET(40 MG) BY MOUTH TWICE DAILY  Dispense: 180 tablet; Refill: 1  5. Blood glucose elevated  - Comprehensive metabolic panel - Hemoglobin A1c  6. Hyperlipidemia, unspecified hyperlipidemia type  - Comprehensive metabolic panel - Lipid Panel With LDL/HDL Ratio  7. CAD  - clopidogrel (PLAVIX) 75 MG tablet; Take 1 tablet (75 mg total) by mouth once for 1 dose.  Dispense: 90 tablet; Refill: 3  8. Acute low back pain, unspecified back pain laterality, with sciatica presence unspecified Consider PT referral.Appears [facility]. - DG Lumbar Spine Complete; Future - naproxen (NAPROSYN) 500 MG  tablet; Take 1 tablet (500 mg total) by mouth 2 (two) times daily with a meal.  Dispense: 30 tablet; Refill: 0 - cyclobenzaprine (FLEXERIL) 10 MG tablet; Take 1 tablet (10 mg total) by mouth 3 (three) times daily as needed for muscle spasms.  Dispense: 30 tablet; Refill: 0  I have done the exam and reviewed the chart and it is accurate to the best of my knowledge. Development worker, community has been used and  any errors in dictation or transcription are unintentional. Miguel Aschoff M.D. [facility]

## 2018-06-27 ENCOUNTER — Telehealth: Payer: Self-pay

## 2018-06-27 LAB — COMPREHENSIVE METABOLIC PANEL
ALT: 21 IU/L (ref 0–44)
AST: 21 IU/L (ref 0–40)
Albumin/Globulin Ratio: 2.2 (ref 1.2–2.2)
Albumin: 4.3 g/dL (ref 3.6–4.8)
Alkaline Phosphatase: 56 IU/L (ref 39–117)
BUN/Creatinine Ratio: 13 (ref 10–24)
BUN: 13 mg/dL (ref 8–27)
Bilirubin Total: 0.6 mg/dL (ref 0.0–1.2)
CO2: 20 mmol/L (ref 20–29)
Calcium: 9.3 mg/dL (ref 8.6–10.2)
Chloride: 104 mmol/L (ref 96–106)
Creatinine, Ser: 1 mg/dL (ref 0.76–1.27)
GFR calc Af Amer: 91 mL/min/{1.73_m2} (ref >59)
GFR calc non Af Amer: 79 mL/min/{1.73_m2} (ref >59)
Globulin, Total: 2 g/dL (ref 1.5–4.5)
Glucose: 93 mg/dL (ref 65–99)
Potassium: 4.4 mmol/L (ref 3.5–5.2)
Sodium: 142 mmol/L (ref 134–144)
Total Protein: 6.3 g/dL (ref 6.0–8.5)

## 2018-06-27 LAB — LIPID PANEL WITH LDL/HDL RATIO
Cholesterol, Total: 128 mg/dL (ref 100–199)
HDL: 41 mg/dL (ref >39)
LDL Calculated: 58 mg/dL (ref 0–99)
LDl/HDL Ratio: 1.4 ratio (ref 0.0–3.6)
Triglycerides: 147 mg/dL (ref 0–149)
VLDL Cholesterol Cal: 29 mg/dL (ref 5–40)

## 2018-06-27 LAB — HEMOGLOBIN A1C
Est. average glucose Bld gHb Est-mCnc: 126 mg/dL
Hgb A1c MFr Bld: 6 % — ABNORMAL HIGH (ref 4.8–5.6)

## 2018-06-27 NOTE — Telephone Encounter (Signed)
-----   Message from Jerrol Banana., MD sent at 06/27/2018  9:42 AM EDT ----- Stable--lipids much better.

## 2018-06-27 NOTE — Telephone Encounter (Signed)
Pt advised.   Thanks,   -Laura  

## 2018-06-27 NOTE — Telephone Encounter (Signed)
-----   Message from Jerrol Banana., MD sent at 06/27/2018  9:47 AM EDT ----- DDD

## 2018-08-16 ENCOUNTER — Ambulatory Visit (INDEPENDENT_AMBULATORY_CARE_PROVIDER_SITE_OTHER): Payer: Medicare Other | Admitting: Family Medicine

## 2018-08-16 VITALS — BP 142/80 | HR 89 | Temp 99.4°F | Resp 16 | Wt 206.0 lb

## 2018-08-16 DIAGNOSIS — I10 Essential (primary) hypertension: Secondary | ICD-10-CM | POA: Diagnosis not present

## 2018-08-16 DIAGNOSIS — G4733 Obstructive sleep apnea (adult) (pediatric): Secondary | ICD-10-CM

## 2018-08-16 DIAGNOSIS — I2510 Atherosclerotic heart disease of native coronary artery without angina pectoris: Secondary | ICD-10-CM | POA: Diagnosis not present

## 2018-08-16 DIAGNOSIS — F3342 Major depressive disorder, recurrent, in full remission: Secondary | ICD-10-CM | POA: Diagnosis not present

## 2018-08-16 DIAGNOSIS — Z23 Encounter for immunization: Secondary | ICD-10-CM

## 2018-08-16 DIAGNOSIS — G479 Sleep disorder, unspecified: Secondary | ICD-10-CM | POA: Diagnosis not present

## 2018-08-16 MED ORDER — METOPROLOL TARTRATE 50 MG PO TABS: 50.0000 mg | ORAL_TABLET | Freq: Every day | ORAL | 3 refills | Status: DC

## 2018-08-16 NOTE — Progress Notes (Signed)
Michael Leon  MRN: [id number] DOB: 06-28-1951  Subjective:  HPI   The patient is a 68 year old male who presents for follow up of back pain.  He was last seen on 06/26/18.  At the time of his last visit he was started on Naproxen and Cyclobenzaprine, he had imaging done and referral to physical therapy.  He reports that his back is doing fine.  Hypertension-patient did not start the Metoprolol after the last visit.  He was unaware he was to take this.   GERD-patient has been getting some relief with the Pantoprazole.  He on occasion only uses once daily instead of twice.  CAD-the patient did not start on Plavix after the last visit  Patient Active Problem List   Diagnosis Date Noted  . Breathlessness on exertion 06/05/2015  . Dizziness 05/08/2015  . Arteriosclerosis of coronary artery 05/07/2015  . HLD (hyperlipidemia) 05/07/2015  . BP (high blood pressure) 05/07/2015  . Obesity 05/07/2015  . Depression 05/07/2015  . GAD (generalized anxiety disorder) 05/07/2015  . Insomnia 05/07/2015  . GERD (gastroesophageal reflux disease) 05/07/2015  . Gout 05/07/2015  . Cervical radiculopathy 05/07/2015  . Prediabetes 05/07/2015  . Tobacco use 05/07/2015  . History of kidney stones 05/07/2015  . Benign essential HTN 05/05/2015  . Anxiety 04/30/2015  . Borderline diabetes 04/30/2015  . ST elevation (STEMI) myocardial infarction ([place]) 04/30/2015  . Blood glucose elevated 04/30/2015  . CHEST PAIN UNSPECIFIED 06/19/2009    Past Medical History:  Diagnosis Date  . ASCVD (arteriosclerotic cardiovascular disease)   . Cervical radiculopathy   . Chest pain, unspecified   . Depression   . Diabetes ([place])   . GAD (generalized anxiety disorder)   . GERD (gastroesophageal reflux disease)   . Gout   . Heart attack ([place])   . HLD (hyperlipidemia)   . Kidney stone   . Obesity   . Reflux     Social History   Socioeconomic History  . Marital status: Single    Spouse name: Not on file   . Number of children: Not on file  . Years of education: Not on file  . Highest education level: Not on file  Occupational History  . Occupation: Retired Technical brewer  . Financial resource strain: Not on file  . Food insecurity:    Worry: Not on file    Inability: Not on file  . Transportation needs:    Medical: Not on file    Non-medical: Not on file  Tobacco Use  . Smoking status: Former Smoker    Packs/day: 1.50    Years: 15.00    Pack years: 22.50    Types: Cigarettes    Last attempt to quit: 10/18/2002    Years since quitting: 15.8  . Smokeless tobacco: Current User    Types: Chew  . Tobacco comment: Dips  Substance and Sexual Activity  . Alcohol use: Yes    Alcohol/week: 4.0 standard drinks    Types: 4 Cans of beer per week    Comment: 1-2 "lite" beers per day  . Drug use: No  . Sexual activity: Not on file  Lifestyle  . Physical activity:    Days per week: Not on file    Minutes per session: Not on file  . Stress: Not on file  Relationships  . Social connections:    Talks on phone: Not on file    Gets together: Not on file    Attends religious service: Not  on file    Active member of club or organization: Not on file    Attends meetings of clubs or organizations: Not on file    Relationship status: Not on file  . Intimate partner violence:    Fear of current or ex partner: Not on file    Emotionally abused: Not on file    Physically abused: Not on file    Forced sexual activity: Not on file  Other Topics Concern  . Not on file  Social History Narrative   Gets regular exercise - walking    Outpatient Encounter Medications as of 08/16/2018  Medication Sig Note  . aspirin 81 MG tablet Take 81 mg by mouth daily.   Marland Kitchen losartan (COZAAR) 50 MG tablet Take 1 tablet (50 mg total) by mouth daily.   Marland Kitchen NITROSTAT 0.4 MG SL tablet Reported on 02/04/2016 05/08/2015: Received from: External Pharmacy  . ONETOUCH DELICA LANCETS 58N MISC 1 each by Does not apply  route 2 (two) times daily as needed.   . pantoprazole (PROTONIX) 40 MG tablet TAKE 1 TABLET(40 MG) BY MOUTH TWICE DAILY   . PARoxetine (PAXIL) 40 MG tablet Take 1 tablet (40 mg total) by mouth daily.   . metoprolol tartrate (LOPRESSOR) 50 MG tablet Take 1 tablet (50 mg total) by mouth daily. (Patient not taking: Reported on 08/16/2018)   . [DISCONTINUED] cyclobenzaprine (FLEXERIL) 10 MG tablet Take 1 tablet (10 mg total) by mouth 3 (three) times daily as needed for muscle spasms. (Patient not taking: Reported on 08/16/2018)   . [DISCONTINUED] glucose blood test strip 1 each by Other route 2 (two) times daily as needed for other. Use as instructed   . [DISCONTINUED] ibuprofen (ADVIL,MOTRIN) 800 MG tablet Take 1 tablet (800 mg total) by mouth every 8 (eight) hours as needed.   . [DISCONTINUED] naproxen (NAPROSYN) 500 MG tablet Take 1 tablet (500 mg total) by mouth 2 (two) times daily with a meal.   . [DISCONTINUED] nystatin-triamcinolone ointment (MYCOLOG) Apply 1 application topically 2 (two) times daily. 10/01/2016: Last used two days ago   No facility-administered encounter medications on file as of 08/16/2018.     Allergies  Allergen Reactions  . Codeine Other (See Comments)    Cold sweats (30 years ago) but has taken since with no reaction    Review of Systems  Constitutional: Negative for fever and malaise/fatigue.  HENT: Negative.   Eyes: Negative.   Respiratory: Negative for cough, shortness of breath and wheezing.   Cardiovascular: Negative for chest pain, palpitations, claudication and leg swelling.  Gastrointestinal: Negative.        Acid reflux   Skin: Negative.   Endo/Heme/Allergies: Negative.   Psychiatric/Behavioral: Negative.     Objective:  BP (!) 142/80 (BP Location: Right Arm, Patient Position: Sitting, Cuff Size: Normal)   Pulse 89   Temp 99.4 F (37.4 C) (Oral)   Resp 16   Wt 206 lb (93.4 kg)   BMI 35.36 kg/m   Physical Exam  Constitutional: He is  oriented to person, place, and time and well-developed, well-nourished, and in no distress.  HENT:  Head: Normocephalic and atraumatic.  Eyes: Conjunctivae are normal. No scleral icterus.  Neck: No thyromegaly present.  Cardiovascular: Normal rate, regular rhythm and normal heart sounds.  Pulmonary/Chest: Effort normal and breath sounds normal.  Abdominal: Soft.  Musculoskeletal: He exhibits no edema.  Neurological: He is alert and oriented to person, place, and time. Gait normal. GCS score is 15.  Skin: Skin  is warm and dry.  Psychiatric: Mood, memory, affect and judgment normal.    Assessment and Plan :  1. Need for influenza vaccination  - Flu vaccine HIGH DOSE PF (Fluzone High dose)  2. Benign essential HTN  - metoprolol tartrate (LOPRESSOR) 50 MG tablet; Take 1 tablet (50 mg total) by mouth daily.  Dispense: 90 tablet; Refill: 3  3. Restless sleeper  - Home sleep test; Future  4. Arteriosclerosis of coronary artery   5. Recurrent major depressive disorder, in full remission ([place])   6. OSA (obstructive sleep apnea) [place] is 11. Obtain sleep study.  I have done the exam and reviewed the chart and it is accurate to the best of my knowledge. Development worker, community has been used and  any errors in dictation or transcription are unintentional. Miguel Aschoff M.D. [facility]

## 2018-08-18 ENCOUNTER — Telehealth: Payer: Self-pay | Admitting: Family Medicine

## 2018-08-18 NOTE — Telephone Encounter (Signed)
Pt doesn't want to drive the long distance to [facility] to pick up sleep study at home test. He wants other arrangements for some place closer.  Please advise.  Thanks, [employer]

## 2018-08-21 ENCOUNTER — Other Ambulatory Visit: Payer: Self-pay

## 2018-08-21 DIAGNOSIS — G479 Sleep disorder, unspecified: Secondary | ICD-10-CM

## 2018-09-04 NOTE — Telephone Encounter (Signed)
Order for home sleep study faxed to [facility]

## 2018-09-19 ENCOUNTER — Ambulatory Visit (INDEPENDENT_AMBULATORY_CARE_PROVIDER_SITE_OTHER): Payer: Medicare Other | Admitting: Family Medicine

## 2018-09-19 VITALS — BP 138/86 | HR 93 | Temp 98.9°F | Resp 16 | Wt 212.0 lb

## 2018-09-19 DIAGNOSIS — J321 Chronic frontal sinusitis: Secondary | ICD-10-CM | POA: Diagnosis not present

## 2018-09-19 DIAGNOSIS — G4730 Sleep apnea, unspecified: Secondary | ICD-10-CM

## 2018-09-19 DIAGNOSIS — I2510 Atherosclerotic heart disease of native coronary artery without angina pectoris: Secondary | ICD-10-CM

## 2018-09-19 MED ORDER — AMOXICILLIN-POT CLAVULANATE 875-125 MG PO TABS: 1.0000 | ORAL_TABLET | Freq: Two times a day (BID) | ORAL | 0 refills | Status: DC

## 2018-09-19 NOTE — Progress Notes (Signed)
Michael Leon  MRN: [id number] DOB: [birth year]  Subjective:  HPI   The patient complains of sinus type headache and congestion.  He reports the congestion in his head is clear.  He has had blood when he blows his nose.  He wakes in the morning with dry mouth.  No cough or fever.  Patient Active Problem List   Diagnosis Date Noted  . Breathlessness on exertion 06/05/2015  . Dizziness 05/08/2015  . Arteriosclerosis of coronary artery 05/07/2015  . HLD (hyperlipidemia) 05/07/2015  . BP (high blood pressure) 05/07/2015  . Obesity 05/07/2015  . Depression 05/07/2015  . GAD (generalized anxiety disorder) 05/07/2015  . Insomnia 05/07/2015  . GERD (gastroesophageal reflux disease) 05/07/2015  . Gout 05/07/2015  . Cervical radiculopathy 05/07/2015  . Prediabetes 05/07/2015  . Tobacco use 05/07/2015  . History of kidney stones 05/07/2015  . Benign essential HTN 05/05/2015  . Anxiety 04/30/2015  . Borderline diabetes 04/30/2015  . ST elevation (STEMI) myocardial infarction ([place]) 04/30/2015  . Blood glucose elevated 04/30/2015  . CHEST PAIN UNSPECIFIED 06/19/2009    Past Medical History:  Diagnosis Date  . ASCVD (arteriosclerotic cardiovascular disease)   . Cervical radiculopathy   . Chest pain, unspecified   . Depression   . Diabetes ([place])   . GAD (generalized anxiety disorder)   . GERD (gastroesophageal reflux disease)   . Gout   . Heart attack ([place])   . HLD (hyperlipidemia)   . Kidney stone   . Obesity   . Reflux     Social History   Socioeconomic History  . Marital status: Single    Spouse name: Not on file  . Number of children: Not on file  . Years of education: Not on file  . Highest education level: Not on file  Occupational History  . Occupation: Retired Technical brewer  . Financial resource strain: Not on file  . Food insecurity:    Worry: Not on file    Inability: Not on file  . Transportation needs:    Medical: Not on file    Non-medical:  Not on file  Tobacco Use  . Smoking status: Former Smoker    Packs/day: 1.50    Years: 15.00    Pack years: 22.50    Types: Cigarettes    Last attempt to quit: 10/18/2002    Years since quitting: 15.9  . Smokeless tobacco: Current User    Types: Chew  . Tobacco comment: Dips  Substance and Sexual Activity  . Alcohol use: Yes    Alcohol/week: 4.0 standard drinks    Types: 4 Cans of beer per week    Comment: 1-2 "lite" beers per day  . Drug use: No  . Sexual activity: Not on file  Lifestyle  . Physical activity:    Days per week: Not on file    Minutes per session: Not on file  . Stress: Not on file  Relationships  . Social connections:    Talks on phone: Not on file    Gets together: Not on file    Attends religious service: Not on file    Active member of club or organization: Not on file    Attends meetings of clubs or organizations: Not on file    Relationship status: Not on file  . Intimate partner violence:    Fear of current or ex partner: Not on file    Emotionally abused: Not on file    Physically abused: Not on file  Forced sexual activity: Not on file  Other Topics Concern  . Not on file  Social History Narrative   Gets regular exercise - walking    Outpatient Encounter Medications as of 09/19/2018  Medication Sig Note  . aspirin 81 MG tablet Take 81 mg by mouth daily.   Marland Kitchen losartan (COZAAR) 50 MG tablet Take 1 tablet (50 mg total) by mouth daily.   . metoprolol tartrate (LOPRESSOR) 50 MG tablet Take 1 tablet (50 mg total) by mouth daily.   Marland Kitchen NITROSTAT 0.4 MG SL tablet Reported on 02/04/2016 05/08/2015: Received from: External Pharmacy  . ONETOUCH DELICA LANCETS 28N MISC 1 each by Does not apply route 2 (two) times daily as needed.   . pantoprazole (PROTONIX) 40 MG tablet TAKE 1 TABLET(40 MG) BY MOUTH TWICE DAILY   . PARoxetine (PAXIL) 40 MG tablet Take 1 tablet (40 mg total) by mouth daily.    No facility-administered encounter medications on file as of  09/19/2018.     Allergies  Allergen Reactions  . Codeine Other (See Comments)    Cold sweats (30 years ago) but has taken since with no reaction    Review of Systems  Constitutional: Negative for chills, fever and malaise/fatigue.  HENT: Positive for congestion, nosebleeds (congestion is bloody at times) and sinus pain. Negative for ear discharge, ear pain, hearing loss, sore throat and tinnitus.   Eyes: Negative for blurred vision, double vision, photophobia, pain, discharge and redness.  Respiratory: Negative for cough, shortness of breath and wheezing.   Cardiovascular: Negative for chest pain, palpitations, orthopnea, claudication and leg swelling.  Gastrointestinal: Negative.   Endo/Heme/Allergies: Negative.   Psychiatric/Behavioral: Negative.     Objective:  BP 138/86 (BP Location: Right Arm, Patient Position: Sitting, Cuff Size: Normal)   Pulse 93   Temp 98.9 F (37.2 C) (Oral)   Resp 16   Wt 212 lb (96.2 kg)   SpO2 95%   BMI 36.39 kg/m   Physical Exam  Constitutional: He is oriented to person, place, and time and well-developed, well-nourished, and in no distress.  HENT:  Head: Normocephalic and atraumatic.  Right Ear: External ear normal.  Left Ear: External ear normal.  Nose: Nose normal.  Mouth/Throat: Oropharynx is clear and moist.  Eyes: Conjunctivae are normal. No scleral icterus.  Neck: No thyromegaly present.  Cardiovascular: Normal rate, regular rhythm and normal heart sounds.  Pulmonary/Chest: Effort normal and breath sounds normal.  Abdominal: Soft.  Musculoskeletal: He exhibits no edema.  Neurological: He is alert and oriented to person, place, and time. Gait normal. GCS score is 15.  Skin: Skin is warm and dry.  Psychiatric: Mood, memory, affect and judgment normal.    Assessment and Plan :   1. Chronic frontal sinusitis Mild but persistant. - amoxicillin-clavulanate (AUGMENTIN) 875-125 MG tablet; Take 1 tablet by mouth 2 (two) times daily.   Dispense: 20 tablet; Refill: 0  2. Sleep apnea, unspecified type Needs sleep study. - Home sleep test; Future     HPI, Exam and A&P Transcribed under the direction and in the presence of Wilhemena Durie., MD. Electronically Signed: Althea Charon, RMA I have done the exam and reviewed the above chart and it is accurate to the best of my knowledge. Development worker, community has been used in this note in any air is in the dictation or transcription are unintentional.

## 2018-10-19 ENCOUNTER — Encounter: Payer: Self-pay | Admitting: Family Medicine

## 2018-10-19 ENCOUNTER — Ambulatory Visit (INDEPENDENT_AMBULATORY_CARE_PROVIDER_SITE_OTHER): Payer: Medicare Other | Admitting: Family Medicine

## 2018-10-19 VITALS — BP 136/82 | HR 72 | Temp 98.7°F | Wt 220.2 lb

## 2018-10-19 DIAGNOSIS — J321 Chronic frontal sinusitis: Secondary | ICD-10-CM | POA: Diagnosis not present

## 2018-10-19 DIAGNOSIS — I2510 Atherosclerotic heart disease of native coronary artery without angina pectoris: Secondary | ICD-10-CM | POA: Diagnosis not present

## 2018-10-19 DIAGNOSIS — I10 Essential (primary) hypertension: Secondary | ICD-10-CM | POA: Diagnosis not present

## 2018-10-19 DIAGNOSIS — G4730 Sleep apnea, unspecified: Secondary | ICD-10-CM | POA: Diagnosis not present

## 2018-10-19 NOTE — Progress Notes (Signed)
Patient: Michael Leon Male    DOB: [birth year]   68 y.o.   MRN: [id number] Visit Date: 10/19/2018  Today's Provider: Wilhemena Durie, MD   Chief Complaint  Patient presents with  . Discuss sleep study results  . ENT referral   Subjective:     HPI Patient presents today to discuss sleep study results. Patient states he received the results the day after the home sleep study was complete. Patient states he did not receive a call from this office with results.   Patient is also requesting a ENT referral to Dr. Tami Ribas recurrent sinusitis.    Allergies  Allergen Reactions  . Codeine Other (See Comments)    Cold sweats (30 years ago) but has taken since with no reaction     Current Outpatient Medications:  .  aspirin 81 MG tablet, Take 81 mg by mouth daily., Disp: , Rfl:  .  losartan (COZAAR) 50 MG tablet, Take 1 tablet (50 mg total) by mouth daily., Disp: 90 tablet, Rfl: 3 .  metoprolol tartrate (LOPRESSOR) 50 MG tablet, Take 1 tablet (50 mg total) by mouth daily., Disp: 90 tablet, Rfl: 3 .  NITROSTAT 0.4 MG SL tablet, Reported on 02/04/2016, Disp: , Rfl: 11 .  ONETOUCH DELICA LANCETS 82U MISC, 1 each by Does not apply route 2 (two) times daily as needed., Disp: 100 each, Rfl: 12 .  pantoprazole (PROTONIX) 40 MG tablet, TAKE 1 TABLET(40 MG) BY MOUTH TWICE DAILY, Disp: 180 tablet, Rfl: 1 .  PARoxetine (PAXIL) 40 MG tablet, Take 1 tablet (40 mg total) by mouth daily., Disp: 90 tablet, Rfl: 3 .  loratadine (CLARITIN) 10 MG tablet, TK 1 T PO D, Disp: , Rfl: 3  Review of Systems  Constitutional: Negative.   HENT: Positive for sinus pressure and sinus pain.   Eyes: Negative.   Respiratory: Negative.   Cardiovascular: Negative.   Gastrointestinal: Negative.   Endocrine: Negative.   Musculoskeletal: Negative.   Allergic/Immunologic: Negative.   Hematological: Negative.   Psychiatric/Behavioral: Negative.     Social History   Tobacco Use  . Smoking status: Former  Smoker    Packs/day: 1.50    Years: 15.00    Pack years: 22.50    Types: Cigarettes    Last attempt to quit: 10/18/2002    Years since quitting: 16.0  . Smokeless tobacco: Current User    Types: Chew  . Tobacco comment: Dips  Substance Use Topics  . Alcohol use: Yes    Alcohol/week: 4.0 standard drinks    Types: 4 Cans of beer per week    Comment: 1-2 "lite" beers per day      Objective:   BP 136/82 (BP Location: Right Arm, Patient Position: Sitting, Cuff Size: Large)   Pulse 72   Temp 98.7 F (37.1 C) (Oral)   Wt 220 lb 3.2 oz (99.9 kg)   BMI 37.80 kg/m  Vitals:   10/19/18 0856  BP: 136/82  Pulse: 72  Temp: 98.7 F (37.1 C)  TempSrc: Oral  Weight: 220 lb 3.2 oz (99.9 kg)     Physical Exam Constitutional:      Appearance: Normal appearance. He is well-developed.  HENT:     Head: Normocephalic and atraumatic.     Right Ear: Tympanic membrane and external ear normal.     Left Ear: Tympanic membrane and external ear normal.     Nose: Nose normal.     Mouth/Throat:  Mouth: Mucous membranes are moist.     Pharynx: Oropharynx is clear.  Eyes:     General: No scleral icterus.    Conjunctiva/sclera: Conjunctivae normal.  Cardiovascular:     Rate and Rhythm: Normal rate and regular rhythm.     Heart sounds: Normal heart sounds.  Pulmonary:     Effort: Pulmonary effort is normal.     Breath sounds: Normal breath sounds.  Abdominal:     Palpations: Abdomen is soft.  Lymphadenopathy:     Cervical: No cervical adenopathy.  Skin:    General: Skin is warm and dry.  Neurological:     Mental Status: He is alert and oriented to person, place, and time.  Psychiatric:        Behavior: Behavior normal.        Thought Content: Thought content normal.        Judgment: Judgment normal.         Assessment & Plan    1. Sleep apnea, unspecified type Sleep study finally arrived after patient left.  It showed moderate to severe sleep apnea.  Will give order for auto  titrating CPAP.  Return to clinic in 2 months. - Cpap titration; Future  2. Chronic frontal sinusitis I think this is mainly viral.  Will offer ENT referral at any point in time.  Reassured patient presently. - Ambulatory referral to ENT  3. Benign essential HTN   4. Arteriosclerosis of coronary artery All risk factors treated.    I have done the exam and reviewed the above chart and it is accurate to the best of my knowledge. Development worker, community has been used in this note in any air is in the dictation or transcription are unintentional.  Wilhemena Durie, MD  [place]

## 2018-10-24 ENCOUNTER — Other Ambulatory Visit: Payer: Self-pay

## 2018-10-24 DIAGNOSIS — G4733 Obstructive sleep apnea (adult) (pediatric): Secondary | ICD-10-CM

## 2018-10-24 NOTE — Progress Notes (Signed)
cpap

## 2018-10-27 DIAGNOSIS — R0981 Nasal congestion: Secondary | ICD-10-CM | POA: Diagnosis not present

## 2018-10-27 DIAGNOSIS — R51 Headache: Secondary | ICD-10-CM | POA: Diagnosis not present

## 2018-10-27 DIAGNOSIS — G4733 Obstructive sleep apnea (adult) (pediatric): Secondary | ICD-10-CM | POA: Diagnosis not present

## 2018-10-31 DIAGNOSIS — J309 Allergic rhinitis, unspecified: Secondary | ICD-10-CM | POA: Diagnosis not present

## 2018-10-31 DIAGNOSIS — R51 Headache: Secondary | ICD-10-CM | POA: Diagnosis not present

## 2018-11-14 DIAGNOSIS — J301 Allergic rhinitis due to pollen: Secondary | ICD-10-CM | POA: Diagnosis not present

## 2018-11-16 ENCOUNTER — Ambulatory Visit (INDEPENDENT_AMBULATORY_CARE_PROVIDER_SITE_OTHER): Payer: Medicare Other | Admitting: Family Medicine

## 2018-11-16 VITALS — BP 126/82 | HR 63 | Temp 98.3°F | Resp 14 | Wt 216.0 lb

## 2018-11-16 DIAGNOSIS — R7303 Prediabetes: Secondary | ICD-10-CM | POA: Diagnosis not present

## 2018-11-16 DIAGNOSIS — J309 Allergic rhinitis, unspecified: Secondary | ICD-10-CM

## 2018-11-16 DIAGNOSIS — E785 Hyperlipidemia, unspecified: Secondary | ICD-10-CM

## 2018-11-16 DIAGNOSIS — I10 Essential (primary) hypertension: Secondary | ICD-10-CM

## 2018-11-16 DIAGNOSIS — F411 Generalized anxiety disorder: Secondary | ICD-10-CM

## 2018-11-16 DIAGNOSIS — I2510 Atherosclerotic heart disease of native coronary artery without angina pectoris: Secondary | ICD-10-CM

## 2018-11-16 LAB — POCT GLYCOSYLATED HEMOGLOBIN (HGB A1C): Hemoglobin A1C: 6.5 % — AB (ref 4.0–5.6)

## 2018-11-16 MED ORDER — FLUTICASONE PROPIONATE 50 MCG/ACT NA SUSP: 2.0000 | Freq: Every day | NASAL | 6 refills | Status: DC

## 2018-11-16 NOTE — Progress Notes (Signed)
Michael Leon  MRN: [id number] DOB: [birth year]  Subjective:  HPI   The patient is a 68 year old male who presents for follow up of his hyperglycemia.  The patient was last seen on 10/19/18 for his sleep study face to face.  His last visit for the glucose was on 06/26/18.  His A1C at that time was 6.0.  The patient got his CPAP and states he has been using it for about 2 weeks now.  He is going to see the company rep on 11/22/18 to see about using a different mask.    The patient has also been to see ENT about his breathing nasally.  The did a scope of the sinuses and said that they did not see anything significant.  They wanted to do allergy testing and were unable to do the skin testing so they did blood work but the patient has not gotten the results yet.  The patient states that he has gotten some Afrin and started using that and reports getting good results and had planned on using it more.  Brief discussion about the dangers involved with long term use of Afrin.  Patient Active Problem List   Diagnosis Date Noted  . Breathlessness on exertion 06/05/2015  . Dizziness 05/08/2015  . Arteriosclerosis of coronary artery 05/07/2015  . HLD (hyperlipidemia) 05/07/2015  . BP (high blood pressure) 05/07/2015  . Obesity 05/07/2015  . Depression 05/07/2015  . GAD (generalized anxiety disorder) 05/07/2015  . Insomnia 05/07/2015  . GERD (gastroesophageal reflux disease) 05/07/2015  . Gout 05/07/2015  . Cervical radiculopathy 05/07/2015  . Prediabetes 05/07/2015  . Tobacco use 05/07/2015  . History of kidney stones 05/07/2015  . Benign essential HTN 05/05/2015  . Anxiety 04/30/2015  . Borderline diabetes 04/30/2015  . ST elevation (STEMI) myocardial infarction ([place]) 04/30/2015  . Blood glucose elevated 04/30/2015  . CHEST PAIN UNSPECIFIED 06/19/2009    Past Medical History:  Diagnosis Date  . ASCVD (arteriosclerotic cardiovascular disease)   . Cervical radiculopathy   . Chest pain,  unspecified   . Depression   . Diabetes ([place])   . GAD (generalized anxiety disorder)   . GERD (gastroesophageal reflux disease)   . Gout   . Heart attack ([place])   . HLD (hyperlipidemia)   . Kidney stone   . Obesity   . Reflux     Social History   Socioeconomic History  . Marital status: Single    Spouse name: Not on file  . Number of children: Not on file  . Years of education: Not on file  . Highest education level: Not on file  Occupational History  . Occupation: Retired Technical brewer  . Financial resource strain: Not on file  . Food insecurity:    Worry: Not on file    Inability: Not on file  . Transportation needs:    Medical: Not on file    Non-medical: Not on file  Tobacco Use  . Smoking status: Former Smoker    Packs/day: 1.50    Years: 15.00    Pack years: 22.50    Types: Cigarettes    Last attempt to quit: 10/18/2002    Years since quitting: 16.0  . Smokeless tobacco: Current User    Types: Chew  . Tobacco comment: Dips  Substance and Sexual Activity  . Alcohol use: Yes    Alcohol/week: 4.0 standard drinks    Types: 4 Cans of beer per week    Comment: 1-2 "lite"  beers per day  . Drug use: No  . Sexual activity: Not on file  Lifestyle  . Physical activity:    Days per week: Not on file    Minutes per session: Not on file  . Stress: Not on file  Relationships  . Social connections:    Talks on phone: Not on file    Gets together: Not on file    Attends religious service: Not on file    Active member of club or organization: Not on file    Attends meetings of clubs or organizations: Not on file    Relationship status: Not on file  . Intimate partner violence:    Fear of current or ex partner: Not on file    Emotionally abused: Not on file    Physically abused: Not on file    Forced sexual activity: Not on file  Other Topics Concern  . Not on file  Social History Narrative   Gets regular exercise - walking    Outpatient Encounter  Medications as of 11/16/2018  Medication Sig  . loratadine (CLARITIN) 10 MG tablet TK 1 T PO D  . losartan (COZAAR) 50 MG tablet Take 1 tablet (50 mg total) by mouth daily.  . metoprolol tartrate (LOPRESSOR) 50 MG tablet Take 1 tablet (50 mg total) by mouth daily.  Marland Kitchen NITROSTAT 0.4 MG SL tablet Reported on 02/04/2016  . ONETOUCH DELICA LANCETS 09N MISC 1 each by Does not apply route 2 (two) times daily as needed.  . pantoprazole (PROTONIX) 40 MG tablet TAKE 1 TABLET(40 MG) BY MOUTH TWICE DAILY  . PARoxetine (PAXIL) 40 MG tablet Take 1 tablet (40 mg total) by mouth daily.  Marland Kitchen aspirin 81 MG tablet Take 81 mg by mouth daily.   No facility-administered encounter medications on file as of 11/16/2018.     Allergies  Allergen Reactions  . Codeine Other (See Comments)    Cold sweats (30 years ago) but has taken since with no reaction    Review of Systems  Constitutional: Negative for fever and malaise/fatigue.  HENT: Positive for congestion.   Eyes: Negative.   Respiratory: Negative for cough, shortness of breath and wheezing.   Cardiovascular: Negative for chest pain, palpitations, orthopnea, claudication and leg swelling.  Gastrointestinal: Negative.   Genitourinary: Negative for frequency.  Skin: Negative.   Endo/Heme/Allergies: Negative for polydipsia.  Psychiatric/Behavioral: Negative.     Objective:  BP 126/82 (BP Location: Right Arm, Patient Position: Sitting, Cuff Size: Normal)   Pulse 63   Temp 98.3 F (36.8 C) (Oral)   Resp 14   Wt 216 lb (98 kg)   SpO2 96%   BMI 37.08 kg/m   Physical Exam  Constitutional: He is oriented to person, place, and time and well-developed, well-nourished, and in no distress.  HENT:  Head: Normocephalic and atraumatic.  Right Ear: External ear normal.  Left Ear: External ear normal.  Nose: Nose normal.  Mouth/Throat: Oropharynx is clear and moist.  Eyes: Conjunctivae are normal. No scleral icterus.  Neck: No thyromegaly present.    Cardiovascular: Normal rate, regular rhythm and normal heart sounds.  Pulmonary/Chest: Effort normal and breath sounds normal.  Abdominal: Soft.  Musculoskeletal:        General: No edema.  Neurological: He is alert and oriented to person, place, and time. Gait normal. GCS score is 15.  Skin: Skin is warm and dry.  Psychiatric: Mood, memory, affect and judgment normal.    Assessment and Plan :  1.  Borderline diabetes - POCT glycosylated hemoglobin (Hb A1C)--6.5 today Lifestyle discussed. 2. Allergic rhinitis, unspecified seasonality, unspecified trigger Advised patient against regular Afrin use.  Recommended he follow-up with ENT if take his own nasal spray does not help. - fluticasone (FLONASE) 50 MCG/ACT nasal spray; Place 2 sprays into both nostrils daily.  Dispense: 16 g; Refill: 6  3. Benign essential HTN   4. Hyperlipidemia, unspecified hyperlipidemia type   5. GAD (generalized anxiety disorder)  I have done the exam and reviewed the chart and it is accurate to the best of my knowledge. Development worker, community has been used and  any errors in dictation or transcription are unintentional. Miguel Aschoff M.D. [facility]

## 2018-12-05 DIAGNOSIS — E669 Obesity, unspecified: Secondary | ICD-10-CM | POA: Diagnosis not present

## 2018-12-05 DIAGNOSIS — I2510 Atherosclerotic heart disease of native coronary artery without angina pectoris: Secondary | ICD-10-CM | POA: Diagnosis not present

## 2018-12-05 DIAGNOSIS — I10 Essential (primary) hypertension: Secondary | ICD-10-CM | POA: Diagnosis not present

## 2018-12-05 DIAGNOSIS — R001 Bradycardia, unspecified: Secondary | ICD-10-CM | POA: Diagnosis not present

## 2018-12-05 DIAGNOSIS — E782 Mixed hyperlipidemia: Secondary | ICD-10-CM | POA: Diagnosis not present

## 2018-12-18 ENCOUNTER — Ambulatory Visit (INDEPENDENT_AMBULATORY_CARE_PROVIDER_SITE_OTHER): Payer: Medicare Other | Admitting: Family Medicine

## 2018-12-18 VITALS — BP 130/80 | HR 57 | Temp 98.6°F | Resp 16 | Wt 217.0 lb

## 2018-12-18 DIAGNOSIS — R7303 Prediabetes: Secondary | ICD-10-CM

## 2018-12-18 DIAGNOSIS — F419 Anxiety disorder, unspecified: Secondary | ICD-10-CM

## 2018-12-18 DIAGNOSIS — I10 Essential (primary) hypertension: Secondary | ICD-10-CM | POA: Diagnosis not present

## 2018-12-18 DIAGNOSIS — G4733 Obstructive sleep apnea (adult) (pediatric): Secondary | ICD-10-CM

## 2018-12-18 DIAGNOSIS — Z9989 Dependence on other enabling machines and devices: Secondary | ICD-10-CM

## 2018-12-18 DIAGNOSIS — I2510 Atherosclerotic heart disease of native coronary artery without angina pectoris: Secondary | ICD-10-CM

## 2018-12-18 NOTE — Progress Notes (Signed)
Michael Leon  MRN: [id number] DOB: 01-07-1951  Subjective:  HPI   The patient is a 68 year old male who presents for follow up for use of CPAP.  The patient had sleep study done on 09/30/18 and it revealed sleep apnea and patient was set up with CPAP.   He states he has been using it and can tell he is sleeping more and feels better after he gets up in the morning. He states he has been using the machine 5 to 7 hours every night.  When he gets 7 hours he states he feels much better the next day.  Air leak around the mask is the main reason that he takes it off after 5 or 6 hours if it continues to leak.  Overall he is doing fairly well with this and is used it every night.  He is interviewing with [employer] for new job and is excited about this.  Patient Active Problem List   Diagnosis Date Noted  . Breathlessness on exertion 06/05/2015  . Dizziness 05/08/2015  . Arteriosclerosis of coronary artery 05/07/2015  . HLD (hyperlipidemia) 05/07/2015  . BP (high blood pressure) 05/07/2015  . Obesity 05/07/2015  . Depression 05/07/2015  . GAD (generalized anxiety disorder) 05/07/2015  . Insomnia 05/07/2015  . GERD (gastroesophageal reflux disease) 05/07/2015  . Gout 05/07/2015  . Cervical radiculopathy 05/07/2015  . Prediabetes 05/07/2015  . Tobacco use 05/07/2015  . History of kidney stones 05/07/2015  . Benign essential HTN 05/05/2015  . Anxiety 04/30/2015  . Borderline diabetes 04/30/2015  . ST elevation (STEMI) myocardial infarction ([place]) 04/30/2015  . Blood glucose elevated 04/30/2015  . CHEST PAIN UNSPECIFIED 06/19/2009    Past Medical History:  Diagnosis Date  . ASCVD (arteriosclerotic cardiovascular disease)   . Cervical radiculopathy   . Chest pain, unspecified   . Depression   . Diabetes ([place])   . GAD (generalized anxiety disorder)   . GERD (gastroesophageal reflux disease)   . Gout   . Heart attack ([place])   . HLD (hyperlipidemia)   . Kidney stone   . Obesity     . Reflux     Social History   Socioeconomic History  . Marital status: Single    Spouse name: Not on file  . Number of children: Not on file  . Years of education: Not on file  . Highest education level: Not on file  Occupational History  . Occupation: Retired Technical brewer  . Financial resource strain: Not on file  . Food insecurity:    Worry: Not on file    Inability: Not on file  . Transportation needs:    Medical: Not on file    Non-medical: Not on file  Tobacco Use  . Smoking status: Former Smoker    Packs/day: 1.50    Years: 15.00    Pack years: 22.50    Types: Cigarettes    Last attempt to quit: 10/18/2002    Years since quitting: 16.1  . Smokeless tobacco: Current User    Types: Chew  . Tobacco comment: Dips  Substance and Sexual Activity  . Alcohol use: Yes    Alcohol/week: 4.0 standard drinks    Types: 4 Cans of beer per week    Comment: 1-2 "lite" beers per day  . Drug use: No  . Sexual activity: Not on file  Lifestyle  . Physical activity:    Days per week: Not on file    Minutes per session: Not  on file  . Stress: Not on file  Relationships  . Social connections:    Talks on phone: Not on file    Gets together: Not on file    Attends religious service: Not on file    Active member of club or organization: Not on file    Attends meetings of clubs or organizations: Not on file    Relationship status: Not on file  . Intimate partner violence:    Fear of current or ex partner: Not on file    Emotionally abused: Not on file    Physically abused: Not on file    Forced sexual activity: Not on file  Other Topics Concern  . Not on file  Social History Narrative   Gets regular exercise - walking    Outpatient Encounter Medications as of 12/18/2018  Medication Sig  . [DISCONTINUED] pantoprazole (PROTONIX) 40 MG tablet TAKE 1 TABLET(40 MG) BY MOUTH TWICE DAILY  . aspirin 81 MG tablet Take 81 mg by mouth daily.  . fluticasone (FLONASE) 50  MCG/ACT nasal spray Place 2 sprays into both nostrils daily.  Marland Kitchen loratadine (CLARITIN) 10 MG tablet TK 1 T PO D  . losartan (COZAAR) 50 MG tablet Take 1 tablet (50 mg total) by mouth daily.  . metoprolol tartrate (LOPRESSOR) 50 MG tablet Take 1 tablet (50 mg total) by mouth daily.  Marland Kitchen NITROSTAT 0.4 MG SL tablet Reported on 02/04/2016  . ONETOUCH DELICA LANCETS 81K MISC 1 each by Does not apply route 2 (two) times daily as needed.  Marland Kitchen PARoxetine (PAXIL) 40 MG tablet Take 1 tablet (40 mg total) by mouth daily.   No facility-administered encounter medications on file as of 12/18/2018.     Allergies  Allergen Reactions  . Codeine Other (See Comments)    Cold sweats (30 years ago) but has taken since with no reaction    Review of Systems  Constitutional: Negative for malaise/fatigue.  HENT: Negative.   Eyes: Negative.   Respiratory: Negative for cough, shortness of breath and wheezing.   Cardiovascular: Negative for chest pain, palpitations, orthopnea, claudication and leg swelling.  Gastrointestinal: Negative.   Neurological: Negative for dizziness and headaches.  Endo/Heme/Allergies: Negative.   Psychiatric/Behavioral: Negative.     Objective:  BP 130/80 (BP Location: Right Arm, Patient Position: Sitting, Cuff Size: Large)   Pulse (!) 57   Temp 98.6 F (37 C) (Oral)   Resp 16   Wt 217 lb (98.4 kg)   SpO2 97%   BMI 37.25 kg/m   Physical Exam  Constitutional: He is oriented to person, place, and time and well-developed, well-nourished, and in no distress.  HENT:  Head: Normocephalic and atraumatic.  Eyes: Conjunctivae are normal.  Neck: No thyromegaly present.  Cardiovascular: Normal rate, regular rhythm and normal heart sounds.  Pulmonary/Chest: Effort normal and breath sounds normal.  Neurological: He is alert and oriented to person, place, and time.  Skin: Skin is warm and dry.  Psychiatric: Memory, affect and judgment normal.    Assessment and Plan :  1. OSA on  CPAP Wearing CPAP nightly use 5 hours and usually 7 hours.  He is doing well with this and feels better.  Return to clinic 3 months.  2. Benign essential HTN Controlled.  3. Arteriosclerosis of coronary artery Risk factors treated.  4. Anxiety  5.DM Return to clinic 3 months.  I have done the exam and reviewed the chart and it is accurate to the best of my knowledge. Diplomatic Services operational officer  technology has been used and  any errors in dictation or transcription are unintentional. Miguel Aschoff M.D. [facility]

## 2018-12-29 ENCOUNTER — Other Ambulatory Visit: Payer: Self-pay

## 2018-12-29 ENCOUNTER — Encounter: Payer: Self-pay | Admitting: Emergency Medicine

## 2018-12-29 ENCOUNTER — Emergency Department: Payer: Medicare Other

## 2018-12-29 ENCOUNTER — Observation Stay
Admission: EM | Admit: 2018-12-29 | Discharge: 2018-12-30 | Disposition: A | Discharge status: Home / Self Care | Payer: Medicare Other | Attending: Internal Medicine | Admitting: Internal Medicine

## 2018-12-29 DIAGNOSIS — E669 Obesity, unspecified: Secondary | ICD-10-CM | POA: Diagnosis not present

## 2018-12-29 DIAGNOSIS — I2510 Atherosclerotic heart disease of native coronary artery without angina pectoris: Secondary | ICD-10-CM | POA: Diagnosis not present

## 2018-12-29 DIAGNOSIS — R0789 Other chest pain: Secondary | ICD-10-CM | POA: Diagnosis not present

## 2018-12-29 DIAGNOSIS — Z885 Allergy status to narcotic agent status: Secondary | ICD-10-CM | POA: Diagnosis not present

## 2018-12-29 DIAGNOSIS — R079 Chest pain, unspecified: Principal | ICD-10-CM | POA: Diagnosis present

## 2018-12-29 DIAGNOSIS — F411 Generalized anxiety disorder: Secondary | ICD-10-CM | POA: Insufficient documentation

## 2018-12-29 DIAGNOSIS — I10 Essential (primary) hypertension: Secondary | ICD-10-CM | POA: Diagnosis not present

## 2018-12-29 DIAGNOSIS — Z6835 Body mass index (BMI) 35.0-35.9, adult: Secondary | ICD-10-CM | POA: Insufficient documentation

## 2018-12-29 DIAGNOSIS — E785 Hyperlipidemia, unspecified: Secondary | ICD-10-CM | POA: Diagnosis not present

## 2018-12-29 DIAGNOSIS — Z79899 Other long term (current) drug therapy: Secondary | ICD-10-CM | POA: Diagnosis not present

## 2018-12-29 DIAGNOSIS — Z7982 Long term (current) use of aspirin: Secondary | ICD-10-CM | POA: Insufficient documentation

## 2018-12-29 DIAGNOSIS — I252 Old myocardial infarction: Secondary | ICD-10-CM | POA: Diagnosis not present

## 2018-12-29 DIAGNOSIS — F329 Major depressive disorder, single episode, unspecified: Secondary | ICD-10-CM | POA: Insufficient documentation

## 2018-12-29 DIAGNOSIS — K219 Gastro-esophageal reflux disease without esophagitis: Secondary | ICD-10-CM | POA: Diagnosis not present

## 2018-12-29 DIAGNOSIS — M109 Gout, unspecified: Secondary | ICD-10-CM | POA: Diagnosis not present

## 2018-12-29 LAB — CBC
HCT: 47.4 % (ref 39.0–52.0)
Hemoglobin: 16.2 g/dL (ref 13.0–17.0)
MCH: 31.3 pg (ref 26.0–34.0)
MCHC: 34.2 g/dL (ref 30.0–36.0)
MCV: 91.7 fL (ref 80.0–100.0)
Platelets: 216 10*3/uL (ref 150–400)
RBC: 5.17 MIL/uL (ref 4.22–5.81)
RDW: 12.7 % (ref 11.5–15.5)
WBC: 8.8 10*3/uL (ref 4.0–10.5)
nRBC: 0 % (ref 0.0–0.2)

## 2018-12-29 LAB — BASIC METABOLIC PANEL
Anion gap: 11 (ref 5–15)
BUN: 12 mg/dL (ref 8–23)
CO2: 21 mmol/L — ABNORMAL LOW (ref 22–32)
Calcium: 8.7 mg/dL — ABNORMAL LOW (ref 8.9–10.3)
Chloride: 108 mmol/L (ref 98–111)
Creatinine, Ser: 1.07 mg/dL (ref 0.61–1.24)
GFR calc Af Amer: >60 mL/min (ref >60)
GFR calc non Af Amer: >60 mL/min (ref >60)
Glucose, Bld: 125 mg/dL — ABNORMAL HIGH (ref 70–99)
Potassium: 3.7 mmol/L (ref 3.5–5.1)
Sodium: 140 mmol/L (ref 135–145)

## 2018-12-29 LAB — TROPONIN I: Troponin I: <0.03 ng/mL (ref <0.03)

## 2018-12-29 MED ORDER — SODIUM CHLORIDE 0.9% FLUSH: 3.0000 mL | Freq: Once | INTRAVENOUS | Status: DC

## 2018-12-29 NOTE — ED Triage Notes (Signed)
Pt arrives via POV with c/o mid sternum chest pain since around 1600. Pt has hx of MI but denies other cardiac symptoms at this time. Pt states that he took a nitroglycerin at home with some relief. Pt is talking in full sentences at this time without distress.

## 2018-12-29 NOTE — ED Notes (Signed)
Pt reports feeling of burping and activity intolerence

## 2018-12-29 NOTE — ED Notes (Signed)
ED Provider at bedside. 

## 2018-12-30 DIAGNOSIS — R079 Chest pain, unspecified: Secondary | ICD-10-CM | POA: Diagnosis not present

## 2018-12-30 DIAGNOSIS — I200 Unstable angina: Secondary | ICD-10-CM | POA: Diagnosis not present

## 2018-12-30 DIAGNOSIS — I10 Essential (primary) hypertension: Secondary | ICD-10-CM | POA: Diagnosis not present

## 2018-12-30 DIAGNOSIS — I2510 Atherosclerotic heart disease of native coronary artery without angina pectoris: Secondary | ICD-10-CM | POA: Diagnosis not present

## 2018-12-30 DIAGNOSIS — F411 Generalized anxiety disorder: Secondary | ICD-10-CM | POA: Diagnosis not present

## 2018-12-30 DIAGNOSIS — I25110 Atherosclerotic heart disease of native coronary artery with unstable angina pectoris: Secondary | ICD-10-CM | POA: Diagnosis not present

## 2018-12-30 LAB — TSH: TSH: 2.123 u[IU]/mL (ref 0.350–4.500)

## 2018-12-30 LAB — TROPONIN I
Troponin I: <0.03 ng/mL (ref <0.03)
Troponin I: <0.03 ng/mL (ref <0.03)

## 2018-12-30 MED ORDER — ASPIRIN EC 81 MG PO TBEC
81.0000 mg | DELAYED_RELEASE_TABLET | Freq: Every day | ORAL | Status: DC
  Administered 2018-12-30: 81 mg via ORAL
  Filled 2018-12-30: qty 1

## 2018-12-30 MED ORDER — PANTOPRAZOLE SODIUM 40 MG PO TBEC
40.0000 mg | DELAYED_RELEASE_TABLET | Freq: Every day | ORAL | Status: DC
  Administered 2018-12-30: 40 mg via ORAL
  Filled 2018-12-30: qty 1

## 2018-12-30 MED ORDER — LOSARTAN POTASSIUM 50 MG PO TABS
50.0000 mg | ORAL_TABLET | Freq: Every day | ORAL | Status: DC
  Administered 2018-12-30: 50 mg via ORAL
  Filled 2018-12-30: qty 1

## 2018-12-30 MED ORDER — ENOXAPARIN SODIUM 40 MG/0.4ML ~~LOC~~ SOLN
40.0000 mg | SUBCUTANEOUS | Status: DC
  Filled 2018-12-30: qty 0.4

## 2018-12-30 MED ORDER — NITROGLYCERIN 0.4 MG SL SUBL: 0.4000 mg | SUBLINGUAL_TABLET | SUBLINGUAL | Status: DC | PRN

## 2018-12-30 MED ORDER — METOPROLOL TARTRATE 50 MG PO TABS: 50.0000 mg | ORAL_TABLET | Freq: Every day | ORAL | Status: DC

## 2018-12-30 MED ORDER — ONDANSETRON HCL 4 MG/2ML IJ SOLN: 4.0000 mg | Freq: Four times a day (QID) | INTRAMUSCULAR | Status: DC | PRN

## 2018-12-30 MED ORDER — ACETAMINOPHEN 650 MG RE SUPP: 650.0000 mg | Freq: Four times a day (QID) | RECTAL | Status: DC | PRN

## 2018-12-30 MED ORDER — PAROXETINE HCL 20 MG PO TABS
40.0000 mg | ORAL_TABLET | Freq: Every day | ORAL | Status: DC
  Filled 2018-12-30: qty 2

## 2018-12-30 MED ORDER — ACETAMINOPHEN 325 MG PO TABS: 650.0000 mg | ORAL_TABLET | Freq: Four times a day (QID) | ORAL | Status: DC | PRN

## 2018-12-30 MED ORDER — ONDANSETRON HCL 4 MG PO TABS: 4.0000 mg | ORAL_TABLET | Freq: Four times a day (QID) | ORAL | Status: DC | PRN

## 2018-12-30 MED ORDER — DOCUSATE SODIUM 100 MG PO CAPS
100.0000 mg | ORAL_CAPSULE | Freq: Two times a day (BID) | ORAL | Status: DC
  Administered 2018-12-30: 100 mg via ORAL
  Filled 2018-12-30: qty 1

## 2018-12-30 MED ORDER — ASPIRIN 81 MG PO CHEW
162.0000 mg | CHEWABLE_TABLET | Freq: Once | ORAL | Status: AC
  Administered 2018-12-30: 162 mg via ORAL
  Filled 2018-12-30: qty 2

## 2018-12-30 NOTE — ED Notes (Signed)
ED TO INPATIENT HANDOFF REPORT  ED Nurse Name and Phone #: Tamiyah Moulin RN [phone]  S Name/Age/Gender Michael Leon 68 y.o. male Room/Bed: ED06A/ED06A  Code Status   Code Status: Not on file  Home/SNF/Other Home Patient oriented to: self, place, time and situation Is this baseline? Yes   Triage Complete: Triage complete  Chief Complaint Chest pain  Triage Note Pt arrives via POV with c/o mid sternum chest pain since around 1600. Pt has hx of MI but denies other cardiac symptoms at this time. Pt states that he took a nitroglycerin at home with some relief. Pt is talking in full sentences at this time without distress.   Allergies Allergies  Allergen Reactions  . Codeine Other (See Comments)    Cold sweats (30 years ago) but has taken since with no reaction    Level of Care/Admitting Diagnosis ED Disposition    ED Disposition Condition [place]: [place] [[id number]]  Level of Care: Telemetry [5]  Diagnosis: Chest pain [128786]  Admitting Physician: Harrie Foreman [[id number]]  Attending Physician: Harrie Foreman [[id number]]  PT Class (Do Not Modify): Observation [104]  PT Acc Code (Do Not Modify): Observation [10022]       B Medical/Surgery History Past Medical History:  Diagnosis Date  . ASCVD (arteriosclerotic cardiovascular disease)   . Cervical radiculopathy   . Chest pain, unspecified   . Depression   . GAD (generalized anxiety disorder)   . GERD (gastroesophageal reflux disease)   . Gout   . Heart attack ([place])   . HLD (hyperlipidemia)   . Kidney stone   . Obesity   . Reflux    Past Surgical History:  Procedure Laterality Date  . ANTERIOR CRUCIATE LIGAMENT REPAIR    . APPENDECTOMY    . CHOLECYSTECTOMY    . CORONARY STENT PLACEMENT    . TONSILLECTOMY    . UPPER GI ENDOSCOPY       A IV Location/Drains/Wounds Patient Lines/Drains/Airways Status   Active Line/Drains/Airways    Name:   Placement date:    Placement time:   Site:   Days:   Peripheral IV 12/30/18 Right Antecubital   12/30/18    0057    Antecubital   less than 1          Intake/Output Last 24 hours No intake or output data in the 24 hours ending 12/30/18 0100  Labs/Imaging Results for orders placed or performed during the hospital encounter of 12/29/18 (from the past 48 hour(s))  Basic metabolic panel     Status: Abnormal   Collection Time: 12/29/18  7:19 PM  Result Value Ref Range   Sodium 140 135 - 145 mmol/L   Potassium 3.7 3.5 - 5.1 mmol/L   Chloride 108 98 - 111 mmol/L   CO2 21 (L) 22 - 32 mmol/L   Glucose, Bld 125 (H) 70 - 99 mg/dL   BUN 12 8 - 23 mg/dL   Creatinine, Ser 1.07 0.61 - 1.24 mg/dL   Calcium 8.7 (L) 8.9 - 10.3 mg/dL   GFR calc non Af Amer >60 >60 mL/min   GFR calc Af Amer >60 >60 mL/min   Anion gap 11 5 - 15    Comment: Performed at [facility], [address] [place], [place] [address]  CBC     Status: None   Collection Time: 12/29/18  7:19 PM  Result Value Ref Range   WBC 8.8 4.0 - 10.5 K/uL  RBC 5.17 4.22 - 5.81 MIL/uL   Hemoglobin 16.2 13.0 - 17.0 g/dL   HCT 47.4 39.0 - 52.0 %   MCV 91.7 80.0 - 100.0 fL   MCH 31.3 26.0 - 34.0 pg   MCHC 34.2 30.0 - 36.0 g/dL   RDW 12.7 11.5 - 15.5 %   Platelets 216 150 - 400 K/uL   nRBC 0.0 0.0 - 0.2 %    Comment: Performed at [facility], [address] [place], [place] [address]  Troponin I - ONCE - STAT     Status: None   Collection Time: 12/29/18  7:19 PM  Result Value Ref Range   Troponin I <0.03 <0.03 ng/mL    Comment: Performed at [facility], [address], [place], [place] [address]   Dg Chest 2 View  Result Date: 12/29/2018 CLINICAL DATA:  Chest pain EXAM: CHEST - 2 VIEW COMPARISON:  July 04, 2015 FINDINGS: The lungs are clear. Heart size and pulmonary vascularity are normal. No adenopathy. No bone lesions. IMPRESSION: No edema or consolidation. Electronically Signed   By: Lowella Grip III M.D.    On: 12/29/2018 19:30    Pending Labs Unresulted Labs (From admission, onward)    Start     Ordered   Signed and Held  Creatinine, serum  (enoxaparin (LOVENOX)    CrCl >/= 30 ml/min)  Weekly,   R    Comments:  while on enoxaparin therapy    Signed and Held   Signed and Held  TSH  Add-on,   R     Signed and Held   Signed and Held  Troponin I - Now Then Q6H  Now then every 6 hours,   R     Signed and Held          Vitals/Pain Today's Vitals   12/29/18 2030 12/29/18 2200 12/29/18 2200 12/30/18 0059  BP: 131/83 137/72  131/77  Pulse: 65 64  62  Resp: 18 13  16   Temp:      TempSrc:      SpO2: 97% 97%  98%  Weight:      Height:      PainSc:   0-No pain 0-No pain    Isolation Precautions No active isolations  Medications Medications  sodium chloride flush (NS) 0.9 % injection 3 mL (3 mLs Intravenous Not Given 12/29/18 1941)  aspirin chewable tablet 162 mg (162 mg Oral Given 12/30/18 0051)    Mobility walks Low fall risk   Focused Assessments Cardiac Assessment Handoff:    Lab Results  Component Value Date   CKTOTAL 195 05/15/2015   CKMB 0.9 03/29/2012   TROPONINI <0.03 12/29/2018   No results found for: DDIMER Does the Patient currently have chest pain? No     R Recommendations: See Admitting Provider Note  Report given to:   Additional Notes: pt lives alone

## 2018-12-30 NOTE — Plan of Care (Signed)
  Problem: Cardiac: Goal: Ability to achieve and maintain adequate cardiovascular perfusion will improve Outcome: Progressing   Problem: Health Behavior/Discharge Planning: Goal: Ability to safely manage health-related needs after discharge will improve Outcome: Progressing   

## 2018-12-30 NOTE — ED Provider Notes (Signed)
[facility] Emergency Department Provider Note   ____________________________________________   First MD Initiated Contact with Patient 12/29/18 2104     (approximate)  I have reviewed the triage vital signs and the nursing notes.   HISTORY  Chief Complaint Chest Pain    HPI Michael Leon is a 68 y.o. male patient presents for evaluation of chest pain. Initial onset of pain was approximately 1-3 hours ago. The patient's chest pain is described as heaviness/pressure/tightness, is worse with exertion and is relieved by nitroglycerin. The patient's chest pain is not middle- or left-sided, is not well-localized, is not sharp and does not radiate to the arms/jaw/neck. The patient does not complain of nausea and denies diaphoresis. The patient has a family history of coronary artery disease in a first-degree relative with onset less than age 38. The patient has no history of stroke, has no history of peripheral artery disease, has not smoked in the past 90 days, denies any history of treated diabetes, is not hypertensive, has no history of hypercholesterolemia and does  an elevated BMI (>=30).   Currently pain-free.  Took 162 mg of aspirin at home.  Reports the symptoms that he had felt just like his previous heart attack, but they are now better.  He took one nitroglycerin as well and relief began.  Occurred while working in his garden.  Past Medical History:  Diagnosis Date  . ASCVD (arteriosclerotic cardiovascular disease)   . Cervical radiculopathy   . Chest pain, unspecified   . Depression   . GAD (generalized anxiety disorder)   . GERD (gastroesophageal reflux disease)   . Gout   . Heart attack ([place])   . HLD (hyperlipidemia)   . Kidney stone   . Obesity   . Reflux     Patient Active Problem List   Diagnosis Date Noted  . Breathlessness on exertion 06/05/2015  . Dizziness 05/08/2015  . Arteriosclerosis of coronary artery 05/07/2015  . HLD  (hyperlipidemia) 05/07/2015  . BP (high blood pressure) 05/07/2015  . Obesity 05/07/2015  . Depression 05/07/2015  . GAD (generalized anxiety disorder) 05/07/2015  . Insomnia 05/07/2015  . GERD (gastroesophageal reflux disease) 05/07/2015  . Gout 05/07/2015  . Cervical radiculopathy 05/07/2015  . Prediabetes 05/07/2015  . History of kidney stones 05/07/2015  . Benign essential HTN 05/05/2015  . Anxiety 04/30/2015  . Borderline diabetes 04/30/2015  . ST elevation (STEMI) myocardial infarction ([place]) 04/30/2015  . Blood glucose elevated 04/30/2015  . CHEST PAIN UNSPECIFIED 06/19/2009    Past Surgical History:  Procedure Laterality Date  . ANTERIOR CRUCIATE LIGAMENT REPAIR    . APPENDECTOMY    . CHOLECYSTECTOMY    . CORONARY STENT PLACEMENT    . TONSILLECTOMY    . UPPER GI ENDOSCOPY      Prior to Admission medications   Medication Sig Start Date End Date Taking? Authorizing Provider  aspirin 81 MG tablet Take 81 mg by mouth daily.   Yes [provider]  losartan (COZAAR) 50 MG tablet Take 1 tablet (50 mg total) by mouth daily. 06/26/18  Yes Jerrol Banana., MD  metoprolol tartrate (LOPRESSOR) 50 MG tablet Take 1 tablet (50 mg total) by mouth daily. 08/16/18  Yes Jerrol Banana., MD  NITROSTAT 0.4 MG SL tablet Reported on 02/04/2016 05/01/15  Yes [provider]  pantoprazole (PROTONIX) 40 MG tablet Take 40 mg by mouth daily.   Yes [provider]  PARoxetine (PAXIL) 40 MG tablet Take 1  tablet (40 mg total) by mouth daily. 06/26/18  Yes Jerrol Banana., MD    Allergies Codeine  Family History  Problem Relation Age of Onset  . Cancer Mother        lung  . Stroke Mother   . Multiple sclerosis Mother   . Heart attack Father   . Bladder Cancer Maternal Grandfather   . Heart disease Maternal Grandfather   . Prostate cancer Neg Hx   . Kidney disease Neg Hx     Social History Social History   Tobacco Use  . Smoking status:  Former Smoker    Packs/day: 1.50    Years: 15.00    Pack years: 22.50    Types: Cigarettes    Last attempt to quit: 10/18/2002    Years since quitting: 16.2  . Smokeless tobacco: Current User    Types: Chew  . Tobacco comment: Dips  Substance Use Topics  . Alcohol use: Yes    Alcohol/week: 4.0 standard drinks    Types: 4 Cans of beer per week    Comment: 1-2 "lite" beers per day  . Drug use: No    Review of Systems Constitutional: No fever/chills Eyes: No visual changes. ENT: No sore throat. Cardiovascular: See HPI Respiratory: Denies shortness of breath. Gastrointestinal: No abdominal pain.   Genitourinary: Negative for dysuria. Musculoskeletal: Negative for back pain. Skin: Negative for rash.  Felt a little sweaty when the episode occurred. Neurological: Negative for headaches, areas of focal weakness or numbness.    ____________________________________________   PHYSICAL EXAM:  VITAL SIGNS: ED Triage Vitals  Enc Vitals Group     BP 12/29/18 1916 (!) 163/80     Pulse Rate 12/29/18 1916 72     Resp 12/29/18 1916 16     Temp 12/29/18 1916 98.3 F (36.8 C)     Temp Source 12/29/18 1916 Oral     SpO2 12/29/18 1916 98 %     Weight 12/29/18 1913 211 lb (95.7 kg)     Height 12/29/18 1913 5' 4.5" (1.638 m)     Head Circumference --      Peak Flow --      Pain Score 12/29/18 1912 0     Pain Loc --      Pain Edu? --      Excl. in [place]? --     Constitutional: Alert and oriented. Well appearing and in no acute distress. Eyes: Conjunctivae are normal. Head: Atraumatic. Nose: No congestion/rhinnorhea. Mouth/Throat: Mucous membranes are moist. Neck: No stridor.  Cardiovascular: Normal rate, regular rhythm. Grossly normal heart sounds.  Good peripheral circulation. Respiratory: Normal respiratory effort.  No retractions. Lungs CTAB. Gastrointestinal: Soft and nontender. No distention. Musculoskeletal: No lower extremity tenderness nor edema. Neurologic:  Normal speech  and language. No gross focal neurologic deficits are appreciated.  Skin:  Skin is warm, dry and intact. No rash noted. Psychiatric: Mood and affect are normal. Speech and behavior are normal.  ____________________________________________   LABS (all labs ordered are listed, but only abnormal results are displayed)  Labs Reviewed  BASIC METABOLIC PANEL - Abnormal; Notable for the following components:      Result Value   CO2 21 (*)    Glucose, Bld 125 (*)    Calcium 8.7 (*)    All other components within normal limits  CBC  TROPONIN I   ____________________________________________  EKG  Reviewed interpreted at 1920 Heart rate 70 QRS 80 QTc 420 Normal sinus rhythm, slight artifact, normal  EKG.  No evidence of acute ischemia ____________________________________________  RADIOLOGY  Dg Chest 2 View  Result Date: 12/29/2018 CLINICAL DATA:  Chest pain EXAM: CHEST - 2 VIEW COMPARISON:  July 04, 2015 FINDINGS: The lungs are clear. Heart size and pulmonary vascularity are normal. No adenopathy. No bone lesions. IMPRESSION: No edema or consolidation. Electronically Signed   By: Lowella Grip III M.D.   On: 12/29/2018 19:30    Chest x-ray reviewed negative for acute ____________________________________________   PROCEDURES  Procedure(s) performed: None  Procedures  Critical Care performed: No  ____________________________________________   INITIAL IMPRESSION / ASSESSMENT AND PLAN / ED COURSE  Pertinent labs & imaging results that were available during my care of the patient were reviewed by me and considered in my medical decision making (see chart for details).   Differential diagnosis includes, but is not limited to, ACS, aortic dissection, pulmonary embolism, cardiac tamponade, pneumothorax, pneumonia, pericarditis, myocarditis, GI-related causes including esophagitis/gastritis, and musculoskeletal chest wall pain.    The patient is moderate risk by heart  score.  Currently showing no signs of acute ischemia on EKG or troponin.  However his history is concerning, possibly demonstrate unstable angina relieved with nitroglycerin now pain-free.  Patient will be admitted for further work-up, patient is in agreement with this plan.      ____________________________________________   FINAL CLINICAL IMPRESSION(S) / ED DIAGNOSES  Final diagnoses:  Moderate coronary artery risk chest pain        Note:  This document was prepared using Dragon voice recognition software and may include unintentional dictation errors  Case admission discussed with Dr. Berdine Addison, MD 12/30/18 0006

## 2018-12-30 NOTE — Discharge Summary (Signed)
[facility] [facility] [place] NAME: Michael Leon    MR#:  [id number]  [place]:  1951/07/17  DATE OF ADMISSION:  12/29/2018   ADMITTING PHYSICIAN: Harrie Foreman, MD  DATE OF DISCHARGE: 12/30/2018 12:16 PM  PRIMARY CARE PHYSICIAN: Jerrol Banana., MD   ADMISSION DIAGNOSIS:  Moderate coronary artery risk chest pain [R07.9] DISCHARGE DIAGNOSIS:  Active Problems:   Chest pain  SECONDARY DIAGNOSIS:   Past Medical History:  Diagnosis Date  . ASCVD (arteriosclerotic cardiovascular disease)   . Cervical radiculopathy   . Chest pain, unspecified   . Depression   . GAD (generalized anxiety disorder)   . GERD (gastroesophageal reflux disease)   . Gout   . Heart attack ([place])   . HLD (hyperlipidemia)   . Kidney stone   . Obesity   . Reflux    HOSPITAL COURSE:   The patient is a 68 year old male with past medical history of CAD and MI, hypertension and GERD presented to the emergency department complaining of chest pain. The patient's pain began hours prior to arrival and was vague in location. He has had waxing and waning chest pain since working in his garden. He describes the discomfort as pressure associated with mild intermittent shortness of breath. The pain does not radiate. Initial troponin was negative but due to his past medical history and risk factors for heart disease the emergency department staff called the hospitalist service for admission.  Assessment/Plan 1.  Chest pain: No EKG changes; troponin remains negative.  Continued to follow cardiac biomarkers.  Monitored telemetry.  Consulted cardiology/[place]: continue home meds and follow-up in 1 week with cardiology for assessment of need for outpatient stress test.  Requested patient be ambulated, he walked in the hall > 300 ft with no recurrence of chest pain or other symptoms.  2.  CAD: Continue aspirin. Statin. Nitroglycerin as needed for chest pain 3.   Hypertension: Controlled; continue metoprolol and losartan 4.  Generalized anxiety disorder: May contribute to symptoms but needed to rule out ACS.  Continue home meds. 5.  DVT prophylaxis: Lovenox while admitted. 6.  GI prophylaxis: Pantoprazole per home regimen  Follow up with PCP to optimize GERD management including weight loss management and increasing physical activity regularly in the future as well.  Patient encouraged to make close follow-up.  DISCHARGE CONDITIONS:  stable CONSULTS OBTAINED:  Treatment Team:  Corey Skains, MD DRUG ALLERGIES:   Allergies  Allergen Reactions  . Codeine Other (See Comments)    Cold sweats (30 years ago) but has taken since with no reaction   DISCHARGE MEDICATIONS:   Allergies as of 12/30/2018      Reactions   Codeine Other (See Comments)   Cold sweats (30 years ago) but has taken since with no reaction      Medication List    TAKE these medications   aspirin 81 MG tablet Take 81 mg by mouth daily.   losartan 50 MG tablet Commonly known as:  COZAAR Take 1 tablet (50 mg total) by mouth daily.   metoprolol tartrate 50 MG tablet Commonly known as:  LOPRESSOR Take 1 tablet (50 mg total) by mouth daily.   Nitrostat 0.4 MG SL tablet Generic drug:  nitroGLYCERIN Reported on 02/04/2016   pantoprazole 40 MG tablet Commonly known as:  PROTONIX Take 40 mg by mouth daily.   PARoxetine 40 MG tablet Commonly known as:  PAXIL Take 1 tablet (40  mg total) by mouth daily.        DISCHARGE INSTRUCTIONS:   DIET:  Cardiac diet DISCHARGE CONDITION:  Stable ACTIVITY:  Activity as tolerated OXYGEN:  Home Oxygen: No.  Oxygen Delivery: room air DISCHARGE LOCATION:  home   If you experience worsening of your admission symptoms, develop shortness of breath, life threatening emergency, suicidal or homicidal thoughts you must seek medical attention immediately by calling 911 or calling your MD immediately if your symptoms are severe.   You Must read complete instructions/literature along with all the possible adverse reactions/side effects for all the medicines you take and that have been prescribed to you. Take any new medicines only after you have completely understood and accept all the possible adverse reactions/side effects.   Please note  You were cared for by a hospitalist during your hospital stay. If you have any questions about your discharge medications or the care you received while you were in the hospital after you are discharged, you can call the unit and asked to speak with the hospitalist on call if the hospitalist that took care of you is not available. Once you are discharged, your primary care physician will handle any further medical issues. Please note that NO REFILLS for any discharge medications will be authorized once you are discharged, as it is imperative that you return to your primary care physician (or establish a relationship with a primary care physician if you do not have one) for your aftercare needs so that they can reassess your need for medications and monitor your lab values.    On the day of Discharge:  VITAL SIGNS:  Blood pressure 122/79, pulse (!) 52, temperature 97.7 F (36.5 C), temperature source Oral, resp. rate 20, height 5\' 4"  (1.626 m), weight 94.8 kg, SpO2 99 %. BMI 35.87 PHYSICAL EXAMINATION:  GENERAL:  68 y.o.-year-old patient lying in the bed with no acute distress.  EYES: Pupils equal, round, reactive to light and accommodation. No scleral icterus. Extraocular muscles intact.  HEENT: Head atraumatic, normocephalic. Oropharynx and nasopharynx clear.  NECK:  Supple, no jugular venous distention. No thyroid enlargement, no tenderness.  LUNGS: Normal breath sounds bilaterally, no wheezing, rales,rhonchi or crepitation. No use of accessory muscles of respiration.  CARDIOVASCULAR: S1, S2 normal. No murmurs, rubs, or gallops.  ABDOMEN: Soft, non-tender, non-distended. Bowel sounds  present. No organomegaly or mass.  EXTREMITIES: No pedal edema, cyanosis, or clubbing.  NEUROLOGIC: Cranial nerves II through XII are intact. Muscle strength 5/5 in all extremities. Sensation intact. Gait not checked.  PSYCHIATRIC: The patient is alert and oriented x 3.  SKIN: No obvious rash, lesion, or ulcer.  DATA REVIEW:   CBC Recent Labs  Lab 12/29/18 1919  WBC 8.8  HGB 16.2  HCT 47.4  PLT 216    Chemistries  Recent Labs  Lab 12/29/18 1919  NA 140  K 3.7  CL 108  CO2 21*  GLUCOSE 125*  BUN 12  CREATININE 1.07  CALCIUM 8.7*     Microbiology Results  No results found for this or any previous visit.  RADIOLOGY:  Dg Chest 2 View  Result Date: 12/29/2018 CLINICAL DATA:  Chest pain EXAM: CHEST - 2 VIEW COMPARISON:  July 04, 2015 FINDINGS: The lungs are clear. Heart size and pulmonary vascularity are normal. No adenopathy. No bone lesions. IMPRESSION: No edema or consolidation. Electronically Signed   By: Lowella Grip III M.D.   On: 12/29/2018 19:30     Management plans discussed with the patient  and/or family and they are in agreement.  CODE STATUS: Prior   TOTAL TIME TAKING CARE OF THIS PATIENT: 45 minutes.    Mareon Robinette PA-C on 12/30/2018 at 5:19 PM  Between 7am to 6pm - Pager - [phone]  After 6pm go to [email or web address] - password [email or web address] [facility]  Office  [phone]  CC: Primary care physician; Jerrol Banana., MD

## 2018-12-30 NOTE — Progress Notes (Signed)
Pt discharged to home/ Vitals stable, chest pain resolved. Discharge instructions given to pt. IV and telemetry discontinued and removed.

## 2018-12-30 NOTE — H&P (Signed)
Michael Leon is an 68 y.o. male.   Chief Complaint: Chest pain HPI: The patient with past medical history of CAD and MI, hypertension and GERD presents to the emergency department complaining of chest pain.  The patient's pain began hours prior to arrival and was vague in location.  He has had waxing and waning chest pain since working in his garden.  He describes the discomfort as pressure associated with mild intermittent shortness of breath.  The pain does not radiate.  Initial troponin was negative but due to his past medical history and risk factors for heart disease the emergency department staff called the hospitalist service for admission.  Past Medical History:  Diagnosis Date  . ASCVD (arteriosclerotic cardiovascular disease)   . Cervical radiculopathy   . Chest pain, unspecified   . Depression   . GAD (generalized anxiety disorder)   . GERD (gastroesophageal reflux disease)   . Gout   . Heart attack ([place])   . HLD (hyperlipidemia)   . Kidney stone   . Obesity   . Reflux     Past Surgical History:  Procedure Laterality Date  . ANTERIOR CRUCIATE LIGAMENT REPAIR    . APPENDECTOMY    . CHOLECYSTECTOMY    . CORONARY STENT PLACEMENT    . TONSILLECTOMY    . UPPER GI ENDOSCOPY      Family History  Problem Relation Age of Onset  . Cancer Mother        lung  . Stroke Mother   . Multiple sclerosis Mother   . Heart attack Father   . Bladder Cancer Maternal Grandfather   . Heart disease Maternal Grandfather   . Prostate cancer Neg Hx   . Kidney disease Neg Hx    Social History:  reports that he quit smoking about 16 years ago. His smoking use included cigarettes. He has a 22.50 pack-year smoking history. His smokeless tobacco use includes chew. He reports current alcohol use of about 4.0 standard drinks of alcohol per week. He reports that he does not use drugs.  Allergies:  Allergies  Allergen Reactions  . Codeine Other (See Comments)    Cold sweats (30 years ago) but  has taken since with no reaction    Medications Prior to Admission  Medication Sig Dispense Refill  . aspirin 81 MG tablet Take 81 mg by mouth daily.    Marland Kitchen losartan (COZAAR) 50 MG tablet Take 1 tablet (50 mg total) by mouth daily. 90 tablet 3  . metoprolol tartrate (LOPRESSOR) 50 MG tablet Take 1 tablet (50 mg total) by mouth daily. 90 tablet 3  . NITROSTAT 0.4 MG SL tablet Reported on 02/04/2016  11  . pantoprazole (PROTONIX) 40 MG tablet Take 40 mg by mouth daily.    Marland Kitchen PARoxetine (PAXIL) 40 MG tablet Take 1 tablet (40 mg total) by mouth daily. 90 tablet 3    Results for orders placed or performed during the hospital encounter of 12/29/18 (from the past 48 hour(s))  Basic metabolic panel     Status: Abnormal   Collection Time: 12/29/18  7:19 PM  Result Value Ref Range   Sodium 140 135 - 145 mmol/L   Potassium 3.7 3.5 - 5.1 mmol/L   Chloride 108 98 - 111 mmol/L   CO2 21 (L) 22 - 32 mmol/L   Glucose, Bld 125 (H) 70 - 99 mg/dL   BUN 12 8 - 23 mg/dL   Creatinine, Ser 1.07 0.61 - 1.24 mg/dL   Calcium 8.7 (  L) 8.9 - 10.3 mg/dL   GFR calc non Af Amer >60 >60 mL/min   GFR calc Af Amer >60 >60 mL/min   Anion gap 11 5 - 15    Comment: Performed at [facility], [address] [place], [place] [address]  CBC     Status: None   Collection Time: 12/29/18  7:19 PM  Result Value Ref Range   WBC 8.8 4.0 - 10.5 K/uL   RBC 5.17 4.22 - 5.81 MIL/uL   Hemoglobin 16.2 13.0 - 17.0 g/dL   HCT 47.4 39.0 - 52.0 %   MCV 91.7 80.0 - 100.0 fL   MCH 31.3 26.0 - 34.0 pg   MCHC 34.2 30.0 - 36.0 g/dL   RDW 12.7 11.5 - 15.5 %   Platelets 216 150 - 400 K/uL   nRBC 0.0 0.0 - 0.2 %    Comment: Performed at [facility], [address] [place], [place] [address]  Troponin I - ONCE - STAT     Status: None   Collection Time: 12/29/18  7:19 PM  Result Value Ref Range   Troponin I <0.03 <0.03 ng/mL    Comment: Performed at [facility], [address] [place], [place] [address]   TSH     Status: None   Collection Time: 12/30/18  1:39 AM  Result Value Ref Range   TSH 2.123 0.350 - 4.500 uIU/mL    Comment: Performed by a 3rd Generation assay with a functional sensitivity of <=0.01 uIU/mL. Performed at [facility], [address] [place], [place] [address]   Troponin I - Now Then Q6H     Status: None   Collection Time: 12/30/18  1:39 AM  Result Value Ref Range   Troponin I <0.03 <0.03 ng/mL    Comment: Performed at [facility], [address], [place], [place] [address]   Dg Chest 2 View  Result Date: 12/29/2018 CLINICAL DATA:  Chest pain EXAM: CHEST - 2 VIEW COMPARISON:  July 04, 2015 FINDINGS: The lungs are clear. Heart size and pulmonary vascularity are normal. No adenopathy. No bone lesions. IMPRESSION: No edema or consolidation. Electronically Signed   By: Lowella Grip III M.D.   On: 12/29/2018 19:30    Review of Systems  Constitutional: Negative for chills and fever.  HENT: Negative for sore throat and tinnitus.   Eyes: Negative for blurred vision and redness.  Respiratory: Negative for cough and shortness of breath.   Cardiovascular: Positive for chest pain. Negative for palpitations, orthopnea and PND.  Gastrointestinal: Negative for abdominal pain, diarrhea, nausea and vomiting.  Genitourinary: Negative for dysuria, frequency and urgency.  Musculoskeletal: Negative for joint pain and myalgias.  Skin: Negative for rash.       No lesions  Neurological: Negative for speech change, focal weakness and weakness.  Endo/Heme/Allergies: Does not bruise/bleed easily.       No temperature intolerance  Psychiatric/Behavioral: Negative for depression and suicidal ideas.    Blood pressure 133/87, pulse (!) 57, temperature 98.6 F (37 C), temperature source Oral, resp. rate 20, height 5\' 4"  (1.626 m), weight 94.8 kg, SpO2 100 %. Physical Exam  Vitals reviewed. Constitutional: He is oriented to person, place, and time. He  appears well-developed and well-nourished. No distress.  HENT:  Head: Normocephalic and atraumatic.  Mouth/Throat: Oropharynx is clear and moist.  Eyes: Pupils are equal, round, and reactive to light. Conjunctivae and EOM are normal. No scleral icterus.  Neck: Normal range of motion.  Neck supple. No JVD present. No tracheal deviation present. No thyromegaly present.  Cardiovascular: Normal rate, regular rhythm and normal heart sounds. Exam reveals no gallop and no friction rub.  No murmur heard. Respiratory: Effort normal and breath sounds normal. No respiratory distress. He has no wheezes.  GI: Soft. Bowel sounds are normal. He exhibits no distension. There is no abdominal tenderness.  Genitourinary:    Genitourinary Comments: Deferred   Musculoskeletal: Normal range of motion.        General: No edema.  Lymphadenopathy:    He has no cervical adenopathy.  Neurological: He is alert and oriented to person, place, and time. No cranial nerve deficit.  Skin: Skin is warm and dry. No rash noted. No erythema.  Psychiatric: He has a normal mood and affect. His behavior is normal. Judgment and thought content normal.     Assessment/Plan This is a 68 year old man admitted for chest pain. 1.  Chest pain: No EKG changes; troponin remains negative.  Continue to follow cardiac biomarkers.  Monitor telemetry.  Consult cardiology 2.  CAD: Continue aspirin.  Nitroglycerin as needed for chest pain 3.  Hypertension: Controlled; continue metoprolol and losartan 4.  Generalized anxiety disorder: May contribute to symptoms but need to rule out ACS.  Continue Loxitane. 5.  DVT prophylaxis: Lovenox 6.  GI prophylaxis: Pantoprazole per home regimen The patient is a full code.  Time spent on admission orders and patient care approximately 45 minutes  Harrie Foreman, MD 12/30/2018, 3:57 AM

## 2018-12-30 NOTE — Discharge Instructions (Signed)
Continue taking your home medications.  Follow-up with Dr. Nehemiah Massed in 1 week for hospital follow-up for your chest pain.  Follow-up with your primary doctor for hospital follow-up within 1 week. Discuss long-term weight loss management and optimizing your reflux management in the future. Continue pantoprazole for now.    Nonspecific Chest Pain, Adult Chest pain can be caused by many different conditions. It can be caused by a condition that is life-threatening and requires treatment right away. It can also be caused by something that is not life-threatening. If you have chest pain, it can be hard to know the difference, so it is important to get help right away to make sure that you do not have a serious condition. Some life-threatening causes of chest pain include:  Heart attack.  A tear in the body's main blood vessel (aortic dissection).  Inflammation around your heart (pericarditis).  A problem in the lungs, such as a blood clot (pulmonary embolism) or a collapsed lung (pneumothorax). Some non life-threatening causes of chest pain include:  Heartburn.  Anxiety or stress.  Damage to the bones, muscles, and cartilage that make up your chest wall.  Pneumonia or bronchitis.  Shingles infection (varicella-zoster virus). Chest pain can feel like:  Pain or discomfort on the surface of your chest or deep in your chest.  Crushing, pressure, aching, or squeezing pain.  Burning or tingling.  Dull or sharp pain that is worse when you move, cough, or take a deep breath.  Pain or discomfort that is also felt in your back, neck, jaw, shoulder, or arm, or pain that spreads to any of these areas. Your chest pain may come and go. It may also be constant. Your health care provider will do lab tests and other studies to find the cause of your pain. Treatment will depend on the cause of your chest pain. Follow these instructions at home: Medicines  Take over-the-counter and prescription  medicines only as told by your health care provider.  If you were prescribed an antibiotic, take it as told by your health care provider. Do not stop taking the antibiotic even if you start to feel better. Lifestyle   Rest as directed by your health care provider.  Do not use any products that contain nicotine or tobacco, such as cigarettes and e-cigarettes. If you need help quitting, ask your health care provider.  Do not drink alcohol.  Make healthy lifestyle choices as recommended. These may include: ? Getting regular exercise. Ask your health care provider to suggest some activities that are safe for you. ? Eating a heart-healthy diet. This includes plenty of fresh fruits and vegetables, whole grains, low-fat (lean) protein, and low-fat dairy products. A dietitian can help you find healthy eating options. ? Maintaining a healthy weight. ? Managing any other health conditions you have, such as high blood pressure (hypertension) or diabetes. ? Reducing stress, such as with yoga or relaxation techniques. General instructions  Pay attention to any changes in your symptoms. Tell your health care provider about them or any new symptoms.  Avoid any activities that cause chest pain.  Keep all follow-up visits as told by your health care provider. This is important. This includes visits for any further testing if your chest pain does not go away. Contact a health care provider if:  Your chest pain does not go away.  You feel depressed.  You have a fever. Get help right away if:  Your chest pain gets worse.  You have a cough  that gets worse, or you cough up blood.  You have severe pain in your abdomen.  You faint.  You have sudden, unexplained chest discomfort.  You have sudden, unexplained discomfort in your arms, back, neck, or jaw.  You have shortness of breath at any time.  You suddenly start to sweat, or your skin gets clammy.  You feel nausea or you vomit.  You  suddenly feel lightheaded or dizzy.  You have severe weakness, or unexplained weakness or fatigue.  Your heart begins to beat quickly, or it feels like it is skipping beats. These symptoms may represent a serious problem that is an emergency. Do not wait to see if the symptoms will go away. Get medical help right away. Call your local emergency services (911 in the [place] Do not drive yourself to the hospital. Summary  Chest pain can be caused by a condition that is serious and requires urgent treatment. It may also be caused by something that is not life-threatening.  If you have chest pain, it is very important to see your health care provider. Your health care provider may do lab tests and other studies to find the cause of your pain.  Follow your health care provider's instructions on taking medicines, making lifestyle changes, and getting emergency treatment if symptoms become worse.  Keep all follow-up visits as told by your health care provider. This includes visits for any further testing if your chest pain does not go away. This information is not intended to replace advice given to you by your health care provider. Make sure you discuss any questions you have with your health care provider. Document Released: 07/14/2005 Document Revised: 04/06/2018 Document Reviewed: 04/06/2018 Elsevier Interactive Patient Education  2019 [employer].

## 2018-12-30 NOTE — Consult Note (Signed)
[facility] Consultation Note  Patient ID: Michael Leon, MRN: [id number], DOB/AGE: [birth year] 68 y.o. Admit date: 12/29/2018   Date of Consult: 12/30/2018 Primary Physician: Jerrol Banana., MD Primary Cardiologist: Nehemiah Massed  Chief Complaint:  Chief Complaint  Patient presents with  . Chest Pain   Reason for Consult: Chest pain  HPI: 68 y.o. male with known coronary artery disease status post non-ST elevation myocardial infarction and PCI and stent placement in the past with hypertension hyperlipidemia on appropriate medication management doing quite well over the last 6 to 8 months having some tiredness in the last several weeks as well is some chest tightness and fatigue after working heavily in the yard.  There is tightness lasted for several hours thereafter and he tried nitroglycerin.  Nitroglycerin did not help his symptoms in any way.  He then decided to come to the emergency room of which his EKG showed normal sinus rhythm with nonspecific ST changes unchanged from before.  The patient has had a normal troponin and full relief of his chest pain when arrived.  The patient currently has felt well since on appropriate medication management.  Past Medical History:  Diagnosis Date  . ASCVD (arteriosclerotic cardiovascular disease)   . Cervical radiculopathy   . Chest pain, unspecified   . Depression   . GAD (generalized anxiety disorder)   . GERD (gastroesophageal reflux disease)   . Gout   . Heart attack ([place])   . HLD (hyperlipidemia)   . Kidney stone   . Obesity   . Reflux       Surgical History:  Past Surgical History:  Procedure Laterality Date  . ANTERIOR CRUCIATE LIGAMENT REPAIR    . APPENDECTOMY    . CHOLECYSTECTOMY    . CORONARY STENT PLACEMENT    . TONSILLECTOMY    . UPPER GI ENDOSCOPY       Home Meds: Prior to Admission medications   Medication Sig Start Date End Date Taking? Authorizing Provider  aspirin 81 MG tablet Take 81 mg by  mouth daily.   Yes [provider]  losartan (COZAAR) 50 MG tablet Take 1 tablet (50 mg total) by mouth daily. 06/26/18  Yes Jerrol Banana., MD  metoprolol tartrate (LOPRESSOR) 50 MG tablet Take 1 tablet (50 mg total) by mouth daily. 08/16/18  Yes Jerrol Banana., MD  NITROSTAT 0.4 MG SL tablet Reported on 02/04/2016 05/01/15  Yes [provider]  pantoprazole (PROTONIX) 40 MG tablet Take 40 mg by mouth daily.   Yes [provider]  PARoxetine (PAXIL) 40 MG tablet Take 1 tablet (40 mg total) by mouth daily. 06/26/18  Yes Jerrol Banana., MD    Inpatient Medications:  . aspirin EC  81 mg Oral Daily  . docusate sodium  100 mg Oral BID  . enoxaparin (LOVENOX) injection  40 mg Subcutaneous Q24H  . losartan  50 mg Oral Daily  . metoprolol tartrate  50 mg Oral Daily  . pantoprazole  40 mg Oral Daily  . PARoxetine  40 mg Oral Daily  . sodium chloride flush  3 mL Intravenous Once     Allergies:  Allergies  Allergen Reactions  . Codeine Other (See Comments)    Cold sweats (30 years ago) but has taken since with no reaction    Social History   Socioeconomic History  . Marital status: Single    Spouse name: Not on file  . Number of children: Not on file  .  Years of education: Not on file  . Highest education level: Not on file  Occupational History  . Occupation: Retired Technical brewer  . Financial resource strain: Not on file  . Food insecurity:    Worry: Not on file    Inability: Not on file  . Transportation needs:    Medical: Not on file    Non-medical: Not on file  Tobacco Use  . Smoking status: Former Smoker    Packs/day: 1.50    Years: 15.00    Pack years: 22.50    Types: Cigarettes    Last attempt to quit: 10/18/2002    Years since quitting: 16.2  . Smokeless tobacco: Current User    Types: Chew  . Tobacco comment: Dips  Substance and Sexual Activity  . Alcohol use: Yes    Alcohol/week: 4.0 standard drinks     Types: 4 Cans of beer per week    Comment: 1-2 "lite" beers per day  . Drug use: No  . Sexual activity: Not on file  Lifestyle  . Physical activity:    Days per week: Not on file    Minutes per session: Not on file  . Stress: Not on file  Relationships  . Social connections:    Talks on phone: Not on file    Gets together: Not on file    Attends religious service: Not on file    Active member of club or organization: Not on file    Attends meetings of clubs or organizations: Not on file    Relationship status: Not on file  . Intimate partner violence:    Fear of current or ex partner: Not on file    Emotionally abused: Not on file    Physically abused: Not on file    Forced sexual activity: Not on file  Other Topics Concern  . Not on file  Social History Narrative   Gets regular exercise - walking     Family History  Problem Relation Age of Onset  . Cancer Mother        lung  . Stroke Mother   . Multiple sclerosis Mother   . Heart attack Father   . Bladder Cancer Maternal Grandfather   . Heart disease Maternal Grandfather   . Prostate cancer Neg Hx   . Kidney disease Neg Hx      Review of Systems Positive for chest pain Negative for: General:  chills, fever, night sweats or weight changes.  Cardiovascular: PND orthopnea syncope dizziness  Dermatological skin lesions rashes Respiratory: Cough congestion Urologic: Frequent urination urination at night and hematuria Abdominal: negative for nausea, vomiting, diarrhea, bright red blood per rectum, melena, or hematemesis Neurologic: negative for visual changes, and/or hearing changes  All other systems reviewed and are otherwise negative except as noted above.  Labs: Recent Labs    12/29/18 1919 12/30/18 0139  TROPONINI <0.03 <0.03   Lab Results  Component Value Date   WBC 8.8 12/29/2018   HGB 16.2 12/29/2018   HCT 47.4 12/29/2018   MCV 91.7 12/29/2018   PLT 216 12/29/2018    Recent Labs  Lab  12/29/18 1919  NA 140  K 3.7  CL 108  CO2 21*  BUN 12  CREATININE 1.07  CALCIUM 8.7*  GLUCOSE 125*   Lab Results  Component Value Date   CHOL 128 06/26/2018   HDL 41 06/26/2018   LDLCALC 58 06/26/2018   TRIG 147 06/26/2018   No results found for: DDIMER  Radiology/Studies:  Dg Chest 2 View  Result Date: 12/29/2018 CLINICAL DATA:  Chest pain EXAM: CHEST - 2 VIEW COMPARISON:  July 04, 2015 FINDINGS: The lungs are clear. Heart size and pulmonary vascularity are normal. No adenopathy. No bone lesions. IMPRESSION: No edema or consolidation. Electronically Signed   By: Lowella Grip III M.D.   On: 12/29/2018 19:30    EKG: Normal sinus rhythm nonspecific ST changes  Weights: Filed Weights   12/29/18 1913 12/30/18 0127  Weight: 95.7 kg 94.8 kg     Physical Exam: Blood pressure 122/79, pulse (!) 52, temperature 97.7 F (36.5 C), temperature source Oral, resp. rate 20, height 5\' 4"  (1.626 m), weight 94.8 kg, SpO2 99 %. Body mass index is 35.87 kg/m. General: Well developed, well nourished, in no acute distress. Head eyes ears nose throat: Normocephalic, atraumatic, sclera non-icteric, no xanthomas, nares are without discharge. No apparent thyromegaly and/or mass  Lungs: Normal respiratory effort.  no wheezes, no rales, no rhonchi.  Heart: RRR with normal S1 S2. no murmur gallop, no rub, PMI is normal size and placement, carotid upstroke normal without bruit, jugular venous pressure is normal Abdomen: Soft, non-tender, non-distended with normoactive bowel sounds. No hepatomegaly. No rebound/guarding. No obvious abdominal masses. Abdominal aorta is normal size without bruit Extremities: No edema. no cyanosis, no clubbing, no ulcers  Peripheral : 2+ bilateral upper extremity pulses, 2+ bilateral femoral pulses, 2+ bilateral dorsal pedal pulse Neuro: Alert and oriented. No facial asymmetry. No focal deficit. Moves all extremities spontaneously. Musculoskeletal: Normal muscle  tone without kyphosis Psych:  Responds to questions appropriately with a normal affect.    Assessment: 68 year old male with known coronary disease status post PCI and stent placement and history of non-ST elevation myocardial infarction hypertension hyperlipidemia with atypical fatigue and chest discomfort without evidence of myocardial infarction or congestive heart failure  Plan: 1.  Continue single antiplatelet therapy for previous history of coronary artery disease 2.  High intensity cholesterol therapy 3.  Continue hypertension control and risk reduction of cardiovascular event with losartan metoprolol 4.  Begin ambulation and follow for improvements of symptoms and further need for further intervention.  If no further symptoms or and patient feeling well with ambulation okay for discharged home from cardiac standpoint with follow-up next week for further possible diagnostic testing including stress test if needed.  Signed, Corey Skains M.D. [facility] 12/30/2018, 7:28 AM

## 2019-03-15 ENCOUNTER — Telehealth: Payer: Self-pay

## 2019-03-15 NOTE — Telephone Encounter (Signed)
Patient called and requested a refill on his Paxil 40 mg, Losartan 50 mg, Metoprolol tartrate 50 mg and Pantoprazole 40 mg. I advised patient that he was prescribed a years supply and he stated he will contact the pharmacy to see if he still have refills remaining and give the office a call back.

## 2019-05-17 ENCOUNTER — Other Ambulatory Visit: Payer: Self-pay

## 2019-08-06 ENCOUNTER — Other Ambulatory Visit: Payer: Self-pay | Admitting: Family Medicine

## 2019-08-06 DIAGNOSIS — F3342 Major depressive disorder, recurrent, in full remission: Secondary | ICD-10-CM

## 2019-08-06 MED ORDER — PAROXETINE HCL 40 MG PO TABS: 40.0000 mg | ORAL_TABLET | Freq: Every day | ORAL | 3 refills | Status: DC

## 2019-08-06 NOTE — Telephone Encounter (Signed)
Pt called saying he needs refill on all his medications  Paxil 40 mg  Meriel Pica  CB#  [phone]  [place]

## 2019-08-27 DIAGNOSIS — I2510 Atherosclerotic heart disease of native coronary artery without angina pectoris: Secondary | ICD-10-CM | POA: Diagnosis not present

## 2019-08-27 DIAGNOSIS — R42 Dizziness and giddiness: Secondary | ICD-10-CM | POA: Diagnosis not present

## 2019-08-27 DIAGNOSIS — E782 Mixed hyperlipidemia: Secondary | ICD-10-CM | POA: Diagnosis not present

## 2019-08-27 DIAGNOSIS — I10 Essential (primary) hypertension: Secondary | ICD-10-CM | POA: Diagnosis not present

## 2019-09-05 NOTE — Progress Notes (Signed)
Patient: Michael Leon Male    DOB: [birth year]   68 y.o.   MRN: [id number] Visit Date: 09/06/2019  Today's Provider: Wilhemena Durie, MD   No chief complaint on file.  Subjective:     HPI   Patient states he has been having pain in his right side buttocks for 1 month. Patient states pain does not radiate. Patient has not been treating pain at home.   Allergies  Allergen Reactions  . Codeine Other (See Comments)    Cold sweats (30 years ago) but has taken since with no reaction     Current Outpatient Medications:  .  aspirin 81 MG tablet, Take 81 mg by mouth daily., Disp: , Rfl:  .  losartan (COZAAR) 50 MG tablet, Take 1 tablet (50 mg total) by mouth daily. (Patient taking differently: Take 50 mg by mouth daily. Takes 1/2 tablet daily), Disp: 90 tablet, Rfl: 3 .  NITROSTAT 0.4 MG SL tablet, Reported on 02/04/2016, Disp: , Rfl: 11 .  pantoprazole (PROTONIX) 40 MG tablet, Take 40 mg by mouth daily., Disp: , Rfl:  .  PARoxetine (PAXIL) 40 MG tablet, Take 1 tablet (40 mg total) by mouth daily., Disp: 90 tablet, Rfl: 3 .  metoprolol tartrate (LOPRESSOR) 50 MG tablet, Take 1 tablet (50 mg total) by mouth daily. (Patient not taking: Reported on 09/06/2019), Disp: 90 tablet, Rfl: 3  Review of Systems  Constitutional: Negative for appetite change, chills and fever.  HENT: Negative.   Eyes: Negative.   Respiratory: Negative for chest tightness, shortness of breath and wheezing.   Cardiovascular: Negative for chest pain and palpitations.  Gastrointestinal: Negative for abdominal pain, nausea and vomiting.  Endocrine: Negative.   Genitourinary: Positive for decreased urine volume and difficulty urinating.  Musculoskeletal: Positive for myalgias.  Allergic/Immunologic: Negative.   Hematological: Negative.   Psychiatric/Behavioral: Negative.     Social History   Tobacco Use  . Smoking status: Former Smoker    Packs/day: 1.50    Years: 15.00    Pack years: 22.50   Types: Cigarettes    Quit date: 10/18/2002    Years since quitting: 16.8  . Smokeless tobacco: Current User    Types: Chew  . Tobacco comment: Dips  Substance Use Topics  . Alcohol use: Yes    Alcohol/week: 4.0 standard drinks    Types: 4 Cans of beer per week    Comment: 1-2 "lite" beers per day      Objective:   BP 110/60 (BP Location: Left Arm, Patient Position: Sitting, Cuff Size: Large)   Pulse 74   Temp (!) 97.4 F (36.3 C) (Other (Comment))   Resp 18   Ht 5\' 4"  (1.626 m)   Wt 192 lb (87.1 kg)   SpO2 97%   BMI 32.96 kg/m  Vitals:   09/06/19 1537  BP: 110/60  Pulse: 74  Resp: 18  Temp: (!) 97.4 F (36.3 C)  TempSrc: Other (Comment)  SpO2: 97%  Weight: 192 lb (87.1 kg)  Height: 5\' 4"  (1.626 m)  Body mass index is 32.96 kg/m.   Physical Exam Vitals signs reviewed.  Constitutional:      Appearance: He is well-developed.  HENT:     Head: Normocephalic and atraumatic.     Right Ear: External ear normal.     Left Ear: External ear normal.     Nose: Nose normal.  Eyes:     Conjunctiva/sclera: Conjunctivae normal.  Cardiovascular:  Rate and Rhythm: Normal rate and regular rhythm.     Heart sounds: Normal heart sounds.  Pulmonary:     Effort: Pulmonary effort is normal.     Breath sounds: Normal breath sounds.  Abdominal:     Palpations: Abdomen is soft.  Musculoskeletal:        General: Tenderness present.     Comments: Very mild tenderness in upper posterior inner hamstring/hip flexor.  Skin:    General: Skin is warm and dry.  Neurological:     General: No focal deficit present.     Mental Status: He is alert and oriented to person, place, and time.  Psychiatric:        Mood and Affect: Mood normal.        Behavior: Behavior normal.        Thought Content: Thought content normal.        Judgment: Judgment normal.      No results found for any visits on 09/06/19.     Assessment & Plan    1. Inguinal strain, right, initial encounter Hip  flexor versus hamstring injury.  May need physical therapy referral. - celecoxib (CELEBREX) 200 MG capsule; Take 1 capsule (200 mg total) by mouth daily.  Dispense: 30 capsule; Refill: 0  2. Benign prostatic hyperplasia with lower urinary tract symptoms, symptom details unspecified  - PSA  3. Need for influenza vaccination  - Flu Vaccine QUAD High Dose(Fluad)   Follow up in February for AWV.    Richard Cranford Mon, MD  [facility]

## 2019-09-06 ENCOUNTER — Other Ambulatory Visit: Payer: Self-pay | Admitting: Family Medicine

## 2019-09-06 ENCOUNTER — Ambulatory Visit (INDEPENDENT_AMBULATORY_CARE_PROVIDER_SITE_OTHER): Payer: Medicare Other | Admitting: Family Medicine

## 2019-09-06 ENCOUNTER — Encounter: Payer: Self-pay | Admitting: Family Medicine

## 2019-09-06 ENCOUNTER — Other Ambulatory Visit: Payer: Self-pay

## 2019-09-06 VITALS — BP 110/60 | HR 74 | Temp 97.4°F | Resp 18 | Ht 64.0 in | Wt 192.0 lb

## 2019-09-06 DIAGNOSIS — S76211A Strain of adductor muscle, fascia and tendon of right thigh, initial encounter: Secondary | ICD-10-CM | POA: Diagnosis not present

## 2019-09-06 DIAGNOSIS — N401 Enlarged prostate with lower urinary tract symptoms: Secondary | ICD-10-CM | POA: Diagnosis not present

## 2019-09-06 DIAGNOSIS — I2510 Atherosclerotic heart disease of native coronary artery without angina pectoris: Secondary | ICD-10-CM

## 2019-09-06 DIAGNOSIS — Z23 Encounter for immunization: Secondary | ICD-10-CM

## 2019-09-06 MED ORDER — CELECOXIB 200 MG PO CAPS: 200.0000 mg | ORAL_CAPSULE | Freq: Every day | ORAL | 0 refills | Status: DC

## 2019-09-07 LAB — PSA: Prostate Specific Ag, Serum: 2.2 ng/mL (ref 0.0–4.0)

## 2019-10-02 ENCOUNTER — Telehealth: Payer: Self-pay

## 2019-10-02 NOTE — Telephone Encounter (Signed)
Please advise - appt?

## 2019-10-02 NOTE — Telephone Encounter (Signed)
Copied from [place] [id number]. Topic: Appointment Scheduling - Scheduling Inquiry for Clinic >> Oct 02, 2019  2:08 PM Scherrie Gerlach wrote: Reason for CRM:  pt would like appt with Dr Rosanna Randy tomorrow for the ongoing issue of his bottem/rectum.  Pt states dr is aware of this issue, but he really needs to see the dr.  Abbott Pao states if he does not answer phone, just leave message with time and he will be there.

## 2019-10-15 NOTE — Progress Notes (Signed)
Patient: Michael Leon Male    DOB: Dec 14, 1950   68 y.o.   MRN: [id number] Visit Date: 10/16/2019  Today's Provider: Wilhemena Durie, MD   Chief Complaint  Patient presents with  . Rectal Irritation   Subjective:    The patient is being seen for Rectal Pain that has been an ongoing issue. It starts from his lower back and goes down through the rectum and into the right leg. He would give the pain a 2/10 but says it feels more like pressure. He says it is a constant feeling but nothing really makes it worse or improves it. After a bowel movement he feels it can help at times. He denies any bleeding or itching to the area.   HPI The pain has been bothering him for few months is relieved when he sits on the toilet.  Not relieved with defecation.  Starts in the area of the coccyx and goes down the right medial buttocks and into the right perianal area.  No problems with bowel or bladder. Allergies  Allergen Reactions  . Codeine Other (See Comments)    Cold sweats (30 years ago) but has taken since with no reaction     Current Outpatient Medications:  .  atorvastatin (LIPITOR) 80 MG tablet, Take by mouth., Disp: , Rfl:  .  aspirin 81 MG tablet, Take 81 mg by mouth daily., Disp: , Rfl:  .  celecoxib (CELEBREX) 200 MG capsule, Take 1 capsule (200 mg total) by mouth daily., Disp: 30 capsule, Rfl: 0 .  losartan (COZAAR) 50 MG tablet, Take 1 tablet (50 mg total) by mouth daily. (Patient taking differently: Take 50 mg by mouth daily. Takes 1/2 tablet daily), Disp: 90 tablet, Rfl: 3 .  metoprolol tartrate (LOPRESSOR) 50 MG tablet, Take 1 tablet (50 mg total) by mouth daily. (Patient not taking: Reported on 09/06/2019), Disp: 90 tablet, Rfl: 3 .  NITROSTAT 0.4 MG SL tablet, Reported on 02/04/2016, Disp: , Rfl: 11 .  pantoprazole (PROTONIX) 40 MG tablet, Take 40 mg by mouth daily., Disp: , Rfl:  .  PARoxetine (PAXIL) 40 MG tablet, Take 1 tablet (40 mg total) by mouth daily., Disp: 90  tablet, Rfl: 3  Review of Systems  Constitutional: Negative for appetite change.  HENT: Negative.   Eyes: Negative.   Respiratory: Negative for chest tightness and wheezing.   Cardiovascular: Negative for palpitations.  Gastrointestinal: Negative for anal bleeding.  Endocrine: Negative.   Genitourinary: Negative.   Musculoskeletal: Positive for back pain.  Skin: Negative.   Allergic/Immunologic: Negative.   Neurological: Negative.   Hematological: Negative.   Psychiatric/Behavioral: Negative.     Social History   Tobacco Use  . Smoking status: Former Smoker    Packs/day: 1.50    Years: 15.00    Pack years: 22.50    Types: Cigarettes    Quit date: 10/18/2002    Years since quitting: 17.0  . Smokeless tobacco: Current User    Types: Chew  . Tobacco comment: Dips  Substance Use Topics  . Alcohol use: Yes    Alcohol/week: 4.0 standard drinks    Types: 4 Cans of beer per week    Comment: 1-2 "lite" beers per day      Objective:   BP 119/69   Pulse 68   Temp (!) 97.3 F (36.3 C) (Temporal)   Resp 18   Ht 5\' 4"  (1.626 m)   Wt 195 lb 9.6 oz (88.7 kg)  SpO2 98%   BMI 33.57 kg/m  Vitals:   10/16/19 1444  BP: 119/69  Pulse: 68  Resp: 18  Temp: (!) 97.3 F (36.3 C)  TempSrc: Temporal  SpO2: 98%  Weight: 195 lb 9.6 oz (88.7 kg)  Height: 5\' 4"  (1.626 m)  Body mass index is 33.57 kg/m.   Physical Exam Vitals reviewed.  Constitutional:      Appearance: He is well-developed.  HENT:     Head: Normocephalic and atraumatic.     Right Ear: External ear normal.     Left Ear: External ear normal.     Nose: Nose normal.  Eyes:     Conjunctiva/sclera: Conjunctivae normal.  Cardiovascular:     Rate and Rhythm: Normal rate and regular rhythm.     Heart sounds: Normal heart sounds.  Pulmonary:     Effort: Pulmonary effort is normal.     Breath sounds: Normal breath sounds.  Abdominal:     Palpations: Abdomen is soft.  Genitourinary:    Prostate: Normal.      Rectum: Normal.  Musculoskeletal:     Comments: Normal examination of the coccyx and buttocks and perianal area.  Rectal exam is normal.  Discomfort he is having is from the coccyx  down  Skin:    General: Skin is warm and dry.  Neurological:     Mental Status: He is alert and oriented to person, place, and time.  Psychiatric:        Behavior: Behavior normal.        Thought Content: Thought content normal.        Judgment: Judgment normal.      No results found for any visits on 10/16/19.     Assessment & Plan    1. Coccydynia  - Ambulatory referral to Gastroenterology  2. Rectal pain Use a Donut to help with pain - Ambulatory referral to Gastroenterology  3. Hyperlipidemia, unspecified hyperlipidemia type  - atorvastatin (LIPITOR) 80 MG tablet; Take by mouth. 4.Polyps of colon Time for colonoscopy. Last 2015.    Richard Cranford Mon, MD  [facility]

## 2019-10-16 ENCOUNTER — Other Ambulatory Visit: Payer: Self-pay

## 2019-10-16 ENCOUNTER — Ambulatory Visit (INDEPENDENT_AMBULATORY_CARE_PROVIDER_SITE_OTHER): Payer: Medicare Other | Admitting: Family Medicine

## 2019-10-16 ENCOUNTER — Encounter: Payer: Self-pay | Admitting: Family Medicine

## 2019-10-16 VITALS — BP 119/69 | HR 68 | Temp 97.3°F | Resp 18 | Ht 64.0 in | Wt 195.6 lb

## 2019-10-16 DIAGNOSIS — E785 Hyperlipidemia, unspecified: Secondary | ICD-10-CM

## 2019-10-16 DIAGNOSIS — M533 Sacrococcygeal disorders, not elsewhere classified: Secondary | ICD-10-CM

## 2019-10-16 DIAGNOSIS — K6289 Other specified diseases of anus and rectum: Secondary | ICD-10-CM | POA: Diagnosis not present

## 2019-10-16 DIAGNOSIS — I2510 Atherosclerotic heart disease of native coronary artery without angina pectoris: Secondary | ICD-10-CM

## 2019-10-16 DIAGNOSIS — D126 Benign neoplasm of colon, unspecified: Secondary | ICD-10-CM

## 2019-10-16 NOTE — Patient Instructions (Signed)
1. Coccydynia  - Ambulatory referral to Gastroenterology  2. Rectal pain Please sit on a Donut to help with pain. - Ambulatory referral to Gastroenterology  3. Hyperlipidemia, unspecified hyperlipidemia type  Refill - atorvastatin (LIPITOR) 80 MG tablet; Take by mouth.

## 2019-10-31 DIAGNOSIS — E782 Mixed hyperlipidemia: Secondary | ICD-10-CM | POA: Diagnosis not present

## 2019-10-31 DIAGNOSIS — I2510 Atherosclerotic heart disease of native coronary artery without angina pectoris: Secondary | ICD-10-CM | POA: Diagnosis not present

## 2019-10-31 DIAGNOSIS — I10 Essential (primary) hypertension: Secondary | ICD-10-CM | POA: Diagnosis not present

## 2019-11-16 DIAGNOSIS — E782 Mixed hyperlipidemia: Secondary | ICD-10-CM | POA: Diagnosis not present

## 2019-11-16 DIAGNOSIS — I10 Essential (primary) hypertension: Secondary | ICD-10-CM | POA: Diagnosis not present

## 2019-11-16 DIAGNOSIS — I2510 Atherosclerotic heart disease of native coronary artery without angina pectoris: Secondary | ICD-10-CM | POA: Diagnosis not present

## 2019-12-05 ENCOUNTER — Ambulatory Visit (INDEPENDENT_AMBULATORY_CARE_PROVIDER_SITE_OTHER): Payer: Medicare Other | Admitting: Gastroenterology

## 2019-12-05 ENCOUNTER — Other Ambulatory Visit: Payer: Self-pay

## 2019-12-05 ENCOUNTER — Encounter: Payer: Self-pay | Admitting: Gastroenterology

## 2019-12-05 VITALS — BP 135/74 | HR 77 | Temp 98.1°F | Ht 64.0 in | Wt 196.6 lb

## 2019-12-05 DIAGNOSIS — K6289 Other specified diseases of anus and rectum: Secondary | ICD-10-CM | POA: Diagnosis not present

## 2019-12-05 NOTE — H&P (View-Only) (Signed)
Gastroenterology Consultation  Referring Provider:     Jerrol Banana.,* Primary Care Physician:  Jerrol Banana., MD Primary Gastroenterologist:  Dr. Allen Norris     Reason for Consultation:     Rectal pain        HPI:   Michael Leon is a 69 y.o. y/o male referred for consultation & management of rectal pain by Dr. Rosanna Randy, Retia Passe., MD.  This patient comes in today with a history of rectal pain.  The patient states that the rectal pain was to the right of his buttocks and went down to his right leg.  He attributed to possible hamstring injury he had from jet skiing.  He now states that his pain has completely gone away.  He is not having any change in bowel habits.  He denies any nausea vomiting fevers chills or unexplained weight loss.  He does report that he is due for a repeat colonoscopy. 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  Past Medical History:  Diagnosis Date  . ASCVD (arteriosclerotic cardiovascular disease)   . Cervical radiculopathy   . Chest pain, unspecified   . Depression   . GAD (generalized anxiety disorder)   . GERD (gastroesophageal reflux disease)   . Gout   . Heart attack ([place])   . HLD (hyperlipidemia)   . Kidney stone   . Obesity   . Reflux     Past Surgical History:  Procedure Laterality Date  . ANTERIOR CRUCIATE LIGAMENT REPAIR    . APPENDECTOMY    . CHOLECYSTECTOMY    . CORONARY STENT PLACEMENT    . TONSILLECTOMY    . UPPER GI ENDOSCOPY      Prior to Admission medications   Medication Sig Start Date End Date Taking? Authorizing Provider  aspirin 81 MG tablet Take 81 mg by mouth daily.   Yes [provider]  atorvastatin (LIPITOR) 80 MG tablet Take by mouth. 08/27/19  Yes [provider]  celecoxib (CELEBREX) 200 MG capsule Take 1 capsule (200 mg total) by mouth daily. 09/06/19  Yes Jerrol Banana., MD  losartan  (COZAAR) 50 MG tablet Take 1 tablet (50 mg total) by mouth daily. Patient taking differently: Take 50 mg by mouth daily. Takes 1/2 tablet daily 06/26/18  Yes Jerrol Banana., MD  NITROSTAT 0.4 MG SL tablet Reported on 02/04/2016 05/01/15  Yes [provider]  pantoprazole (PROTONIX) 40 MG tablet Take 40 mg by mouth daily.   Yes [provider]  PARoxetine (PAXIL) 40 MG tablet Take 1 tablet (40 mg total) by mouth daily. 08/06/19  Yes Jerrol Banana., MD  metoprolol tartrate (LOPRESSOR) 50 MG tablet Take 1 tablet (50 mg total) by mouth daily. Patient not taking: Reported on 09/06/2019 08/16/18   Jerrol Banana., MD    Family History  Problem Relation Age of Onset  . Cancer Mother        lung  . Stroke Mother   . Multiple sclerosis Mother   . Heart attack Father   . Bladder Cancer Maternal Grandfather   . Heart disease Maternal Grandfather   . Prostate cancer Neg Hx   . Kidney disease Neg Hx      Social History   Tobacco Use  . Smoking status: Former Smoker    Packs/day: 1.50    Years: 15.00    Pack  years: 22.50    Types: Cigarettes    Quit date: 10/18/2002    Years since quitting: 17.1  . Smokeless tobacco: Current User    Types: Chew  . Tobacco comment: Dips  Substance Use Topics  . Alcohol use: Yes    Alcohol/week: 4.0 standard drinks    Types: 4 Cans of beer per week    Comment: 1-2 "lite" beers per day  . Drug use: No    Allergies as of 12/05/2019 - Review Complete 12/05/2019  Allergen Reaction Noted  . Codeine Other (See Comments) 05/06/2015    Review of Systems:    All systems reviewed and negative except where noted in HPI.   Physical Exam:  BP 135/74   Pulse 77   Temp 98.1 F (36.7 C) (Temporal)   Ht 5\' 4"  (1.626 m)   Wt 196 lb 9.6 oz (89.2 kg)   BMI 33.75 kg/m  No LMP for male patient. General:   Alert,  Well-developed, well-nourished, pleasant and cooperative in NAD Head:  Normocephalic and atraumatic. Eyes:   Sclera clear, no icterus.   Conjunctiva pink. Ears:  Normal auditory acuity. Neck:  Supple; no masses or thyromegaly. Lungs:  Respirations even and unlabored.  Clear throughout to auscultation.   No wheezes, crackles, or rhonchi. No acute distress. Heart:  Regular rate and rhythm; no murmurs, clicks, rubs, or gallops. Abdomen:  Normal bowel sounds.  No bruits.  Soft, non-tender and non-distended without masses, hepatosplenomegaly or hernias noted.  No guarding or rebound tenderness.  Negative Carnett sign.   Rectal:  Deferred.  Pulses:  Normal pulses noted. Extremities:  No clubbing or edema.  No cyanosis. Neurologic:  Alert and oriented x3;  grossly normal neurologically. Skin:  Intact without significant lesions or rashes.  No jaundice. Lymph Nodes:  No significant cervical adenopathy. Psych:  Alert and cooperative. Normal mood and affect.  Imaging Studies: No results found.  Assessment and Plan:   Michael Leon is a 69 y.o. y/o male who comes in today with a history of a tubular adenoma and rectal pain that was shooting down his right leg.  The patient states that the symptoms have completely resolved as far as his rectal pain is concerned and his pain was actually more in the buttocks and it was in the rectum.  The patient has been told to monitor that situation and inform me if the discomfort comes back.  Otherwise the patient will be set up for a surveillance colonoscopy due to his history of a tubular adenoma 2015.  The patient has been explained the plan and agrees with it.    [name], MD. Marval Regal    Note: This dictation was prepared with Dragon dictation along with smaller phrase technology. Any transcriptional errors that result from this process are unintentional.

## 2019-12-05 NOTE — Progress Notes (Signed)
Gastroenterology Consultation  Referring Provider:     Jerrol Banana.,* Primary Care Physician:  Jerrol Banana., MD Primary Gastroenterologist:  Dr. Allen Norris     Reason for Consultation:     Rectal pain        HPI:   Michael Leon is a 69 y.o. y/o male referred for consultation & management of rectal pain by Dr. Rosanna Randy, Retia Passe., MD.  This patient comes in today with a history of rectal pain.  The patient states that the rectal pain was to the right of his buttocks and went down to his right leg.  He attributed to possible hamstring injury he had from jet skiing.  He now states that his pain has completely gone away.  He is not having any change in bowel habits.  He denies any nausea vomiting fevers chills or unexplained weight loss.  He does report that he is due for a repeat colonoscopy. 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  Past Medical History:  Diagnosis Date  . ASCVD (arteriosclerotic cardiovascular disease)   . Cervical radiculopathy   . Chest pain, unspecified   . Depression   . GAD (generalized anxiety disorder)   . GERD (gastroesophageal reflux disease)   . Gout   . Heart attack ([place])   . HLD (hyperlipidemia)   . Kidney stone   . Obesity   . Reflux     Past Surgical History:  Procedure Laterality Date  . ANTERIOR CRUCIATE LIGAMENT REPAIR    . APPENDECTOMY    . CHOLECYSTECTOMY    . CORONARY STENT PLACEMENT    . TONSILLECTOMY    . UPPER GI ENDOSCOPY      Prior to Admission medications   Medication Sig Start Date End Date Taking? Authorizing Provider  aspirin 81 MG tablet Take 81 mg by mouth daily.   Yes [provider]  atorvastatin (LIPITOR) 80 MG tablet Take by mouth. 08/27/19  Yes [provider]  celecoxib (CELEBREX) 200 MG capsule Take 1 capsule (200 mg total) by mouth daily. 09/06/19  Yes Jerrol Banana., MD  losartan  (COZAAR) 50 MG tablet Take 1 tablet (50 mg total) by mouth daily. Patient taking differently: Take 50 mg by mouth daily. Takes 1/2 tablet daily 06/26/18  Yes Jerrol Banana., MD  NITROSTAT 0.4 MG SL tablet Reported on 02/04/2016 05/01/15  Yes [provider]  pantoprazole (PROTONIX) 40 MG tablet Take 40 mg by mouth daily.   Yes [provider]  PARoxetine (PAXIL) 40 MG tablet Take 1 tablet (40 mg total) by mouth daily. 08/06/19  Yes Jerrol Banana., MD  metoprolol tartrate (LOPRESSOR) 50 MG tablet Take 1 tablet (50 mg total) by mouth daily. Patient not taking: Reported on 09/06/2019 08/16/18   Jerrol Banana., MD    Family History  Problem Relation Age of Onset  . Cancer Mother        lung  . Stroke Mother   . Multiple sclerosis Mother   . Heart attack Father   . Bladder Cancer Maternal Grandfather   . Heart disease Maternal Grandfather   . Prostate cancer Neg Hx   . Kidney disease Neg Hx      Social History   Tobacco Use  . Smoking status: Former Smoker    Packs/day: 1.50    Years: 15.00    Pack  years: 22.50    Types: Cigarettes    Quit date: 10/18/2002    Years since quitting: 17.1  . Smokeless tobacco: Current User    Types: Chew  . Tobacco comment: Dips  Substance Use Topics  . Alcohol use: Yes    Alcohol/week: 4.0 standard drinks    Types: 4 Cans of beer per week    Comment: 1-2 "lite" beers per day  . Drug use: No    Allergies as of 12/05/2019 - Review Complete 12/05/2019  Allergen Reaction Noted  . Codeine Other (See Comments) 05/06/2015    Review of Systems:    All systems reviewed and negative except where noted in HPI.   Physical Exam:  BP 135/74   Pulse 77   Temp 98.1 F (36.7 C) (Temporal)   Ht 5\' 4"  (1.626 m)   Wt 196 lb 9.6 oz (89.2 kg)   BMI 33.75 kg/m  No LMP for male patient. General:   Alert,  Well-developed, well-nourished, pleasant and cooperative in NAD Head:  Normocephalic and atraumatic. Eyes:   Sclera clear, no icterus.   Conjunctiva pink. Ears:  Normal auditory acuity. Neck:  Supple; no masses or thyromegaly. Lungs:  Respirations even and unlabored.  Clear throughout to auscultation.   No wheezes, crackles, or rhonchi. No acute distress. Heart:  Regular rate and rhythm; no murmurs, clicks, rubs, or gallops. Abdomen:  Normal bowel sounds.  No bruits.  Soft, non-tender and non-distended without masses, hepatosplenomegaly or hernias noted.  No guarding or rebound tenderness.  Negative Carnett sign.   Rectal:  Deferred.  Pulses:  Normal pulses noted. Extremities:  No clubbing or edema.  No cyanosis. Neurologic:  Alert and oriented x3;  grossly normal neurologically. Skin:  Intact without significant lesions or rashes.  No jaundice. Lymph Nodes:  No significant cervical adenopathy. Psych:  Alert and cooperative. Normal mood and affect.  Imaging Studies: No results found.  Assessment and Plan:   Michael Leon is a 69 y.o. y/o male who comes in today with a history of a tubular adenoma and rectal pain that was shooting down his right leg.  The patient states that the symptoms have completely resolved as far as his rectal pain is concerned and his pain was actually more in the buttocks and it was in the rectum.  The patient has been told to monitor that situation and inform me if the discomfort comes back.  Otherwise the patient will be set up for a surveillance colonoscopy due to his history of a tubular adenoma 2015.  The patient has been explained the plan and agrees with it.    Michael Lame, MD. Marval Regal    Note: This dictation was prepared with Dragon dictation along with smaller phrase technology. Any transcriptional errors that result from this process are unintentional.

## 2019-12-06 ENCOUNTER — Other Ambulatory Visit: Payer: Self-pay

## 2019-12-06 ENCOUNTER — Ambulatory Visit: Payer: Self-pay | Admitting: Family Medicine

## 2019-12-06 DIAGNOSIS — Z86010 Personal history of colonic polyps: Secondary | ICD-10-CM

## 2019-12-20 DIAGNOSIS — Z23 Encounter for immunization: Secondary | ICD-10-CM | POA: Diagnosis not present

## 2019-12-24 ENCOUNTER — Other Ambulatory Visit: Payer: Self-pay

## 2019-12-24 ENCOUNTER — Encounter: Payer: Self-pay | Admitting: Gastroenterology

## 2019-12-27 ENCOUNTER — Other Ambulatory Visit: Payer: Self-pay

## 2019-12-27 ENCOUNTER — Other Ambulatory Visit
Admission: RE | Admit: 2019-12-27 | Discharge: 2019-12-27 | Disposition: A | Discharge status: Home / Self Care | Payer: Medicare Other | Source: Ambulatory Visit | Attending: Gastroenterology | Admitting: Gastroenterology

## 2019-12-27 DIAGNOSIS — Z01812 Encounter for preprocedural laboratory examination: Secondary | ICD-10-CM | POA: Insufficient documentation

## 2019-12-27 DIAGNOSIS — Z20822 Contact with and (suspected) exposure to covid-19: Secondary | ICD-10-CM | POA: Insufficient documentation

## 2019-12-27 LAB — SARS CORONAVIRUS 2 (TAT 6-24 HRS): SARS Coronavirus 2: NEGATIVE

## 2019-12-27 NOTE — Discharge Instructions (Signed)
General Anesthesia, Adult, Care After This sheet gives you information about how to care for yourself after your procedure. Your health care provider may also give you more specific instructions. If you have problems or questions, contact your health care provider. What can I expect after the procedure? After the procedure, the following side effects are common:  Pain or discomfort at the IV site.  Nausea.  Vomiting.  Sore throat.  Trouble concentrating.  Feeling cold or chills.  Weak or tired.  Sleepiness and fatigue.  Soreness and body aches. These side effects can affect parts of the body that were not involved in surgery. Follow these instructions at home:  For at least 24 hours after the procedure:  Have a responsible adult stay with you. It is important to have someone help care for you until you are awake and alert.  Rest as needed.  Do not: ? Participate in activities in which you could fall or become injured. ? Drive. ? Use heavy machinery. ? Drink alcohol. ? Take sleeping pills or medicines that cause drowsiness. ? Make important decisions or sign legal documents. ? Take care of children on your own. Eating and drinking  Follow any instructions from your health care provider about eating or drinking restrictions.  When you feel hungry, start by eating small amounts of foods that are soft and easy to digest (bland), such as toast. Gradually return to your regular diet.  Drink enough fluid to keep your urine pale yellow.  If you vomit, rehydrate by drinking water, juice, or clear broth. General instructions  If you have sleep apnea, surgery and certain medicines can increase your risk for breathing problems. Follow instructions from your health care provider about wearing your sleep device: ? Anytime you are sleeping, including during daytime naps. ? While taking prescription pain medicines, sleeping medicines, or medicines that make you drowsy.  Return to  your normal activities as told by your health care provider. Ask your health care provider what activities are safe for you.  Take over-the-counter and prescription medicines only as told by your health care provider.  If you smoke, do not smoke without supervision.  Keep all follow-up visits as told by your health care provider. This is important. Contact a health care provider if:  You have nausea or vomiting that does not get better with medicine.  You cannot eat or drink without vomiting.  You have pain that does not get better with medicine.  You are unable to pass urine.  You develop a skin rash.  You have a fever.  You have redness around your IV site that gets worse. Get help right away if:  You have difficulty breathing.  You have chest pain.  You have blood in your urine or stool, or you vomit blood. Summary  After the procedure, it is common to have a sore throat or nausea. It is also common to feel tired.  Have a responsible adult stay with you for the first 24 hours after general anesthesia. It is important to have someone help care for you until you are awake and alert.  When you feel hungry, start by eating small amounts of foods that are soft and easy to digest (bland), such as toast. Gradually return to your regular diet.  Drink enough fluid to keep your urine pale yellow.  Return to your normal activities as told by your health care provider. Ask your health care provider what activities are safe for you. This information is not   intended to replace advice given to you by your health care provider. Make sure you discuss any questions you have with your health care provider. Document Revised: 10/07/2017 Document Reviewed: 05/20/2017 Elsevier Patient Education  2020 [employer]  

## 2019-12-31 ENCOUNTER — Encounter
Admission: RE | Disposition: A | Discharge status: Home / Self Care | Payer: Self-pay | Source: Home / Self Care | Attending: Gastroenterology

## 2019-12-31 ENCOUNTER — Ambulatory Visit: Payer: Medicare Other | Admitting: Anesthesiology

## 2019-12-31 ENCOUNTER — Ambulatory Visit
Admission: RE | Admit: 2019-12-31 | Discharge: 2019-12-31 | Disposition: A | Discharge status: Home / Self Care | Payer: Medicare Other | Attending: Gastroenterology | Admitting: Gastroenterology

## 2019-12-31 ENCOUNTER — Encounter: Payer: Self-pay | Admitting: Gastroenterology

## 2019-12-31 ENCOUNTER — Other Ambulatory Visit: Payer: Self-pay

## 2019-12-31 DIAGNOSIS — Z8052 Family history of malignant neoplasm of bladder: Secondary | ICD-10-CM | POA: Insufficient documentation

## 2019-12-31 DIAGNOSIS — F329 Major depressive disorder, single episode, unspecified: Secondary | ICD-10-CM | POA: Insufficient documentation

## 2019-12-31 DIAGNOSIS — E669 Obesity, unspecified: Secondary | ICD-10-CM | POA: Diagnosis not present

## 2019-12-31 DIAGNOSIS — Z860100 Personal history of colon polyps, unspecified: Secondary | ICD-10-CM

## 2019-12-31 DIAGNOSIS — E785 Hyperlipidemia, unspecified: Secondary | ICD-10-CM | POA: Insufficient documentation

## 2019-12-31 DIAGNOSIS — I10 Essential (primary) hypertension: Secondary | ICD-10-CM | POA: Diagnosis not present

## 2019-12-31 DIAGNOSIS — Z820 Family history of epilepsy and other diseases of the nervous system: Secondary | ICD-10-CM | POA: Insufficient documentation

## 2019-12-31 DIAGNOSIS — F419 Anxiety disorder, unspecified: Secondary | ICD-10-CM | POA: Insufficient documentation

## 2019-12-31 DIAGNOSIS — I2510 Atherosclerotic heart disease of native coronary artery without angina pectoris: Secondary | ICD-10-CM | POA: Diagnosis not present

## 2019-12-31 DIAGNOSIS — K641 Second degree hemorrhoids: Secondary | ICD-10-CM | POA: Insufficient documentation

## 2019-12-31 DIAGNOSIS — Z87442 Personal history of urinary calculi: Secondary | ICD-10-CM | POA: Insufficient documentation

## 2019-12-31 DIAGNOSIS — M109 Gout, unspecified: Secondary | ICD-10-CM | POA: Insufficient documentation

## 2019-12-31 DIAGNOSIS — I252 Old myocardial infarction: Secondary | ICD-10-CM | POA: Diagnosis not present

## 2019-12-31 DIAGNOSIS — Z9049 Acquired absence of other specified parts of digestive tract: Secondary | ICD-10-CM | POA: Insufficient documentation

## 2019-12-31 DIAGNOSIS — Z801 Family history of malignant neoplasm of trachea, bronchus and lung: Secondary | ICD-10-CM | POA: Insufficient documentation

## 2019-12-31 DIAGNOSIS — Z8249 Family history of ischemic heart disease and other diseases of the circulatory system: Secondary | ICD-10-CM | POA: Diagnosis not present

## 2019-12-31 DIAGNOSIS — Z791 Long term (current) use of non-steroidal anti-inflammatories (NSAID): Secondary | ICD-10-CM | POA: Insufficient documentation

## 2019-12-31 DIAGNOSIS — Z6833 Body mass index (BMI) 33.0-33.9, adult: Secondary | ICD-10-CM | POA: Insufficient documentation

## 2019-12-31 DIAGNOSIS — Z7982 Long term (current) use of aspirin: Secondary | ICD-10-CM | POA: Insufficient documentation

## 2019-12-31 DIAGNOSIS — Z87891 Personal history of nicotine dependence: Secondary | ICD-10-CM | POA: Diagnosis not present

## 2019-12-31 DIAGNOSIS — K219 Gastro-esophageal reflux disease without esophagitis: Secondary | ICD-10-CM | POA: Diagnosis not present

## 2019-12-31 DIAGNOSIS — R079 Chest pain, unspecified: Secondary | ICD-10-CM | POA: Insufficient documentation

## 2019-12-31 DIAGNOSIS — Z1211 Encounter for screening for malignant neoplasm of colon: Secondary | ICD-10-CM | POA: Insufficient documentation

## 2019-12-31 DIAGNOSIS — Z79899 Other long term (current) drug therapy: Secondary | ICD-10-CM | POA: Insufficient documentation

## 2019-12-31 DIAGNOSIS — M5412 Radiculopathy, cervical region: Secondary | ICD-10-CM | POA: Insufficient documentation

## 2019-12-31 DIAGNOSIS — Z955 Presence of coronary angioplasty implant and graft: Secondary | ICD-10-CM | POA: Insufficient documentation

## 2019-12-31 DIAGNOSIS — Z823 Family history of stroke: Secondary | ICD-10-CM | POA: Insufficient documentation

## 2019-12-31 DIAGNOSIS — G4730 Sleep apnea, unspecified: Secondary | ICD-10-CM | POA: Diagnosis not present

## 2019-12-31 HISTORY — DX: Essential (primary) hypertension: I10

## 2019-12-31 HISTORY — PX: COLONOSCOPY WITH PROPOFOL: SHX5780

## 2019-12-31 HISTORY — DX: Dizziness and giddiness: R42

## 2019-12-31 SURGERY — COLONOSCOPY WITH PROPOFOL
Anesthesia: General | Site: Rectum

## 2019-12-31 MED ORDER — ONDANSETRON HCL 4 MG/2ML IJ SOLN: 4.0000 mg | Freq: Once | INTRAMUSCULAR | Status: DC | PRN

## 2019-12-31 MED ORDER — LACTATED RINGERS IV SOLN: INTRAVENOUS | Status: DC

## 2019-12-31 MED ORDER — PROPOFOL 10 MG/ML IV BOLUS
INTRAVENOUS | Status: DC | PRN
  Administered 2019-12-31: 120 mg via INTRAVENOUS
  Administered 2019-12-31: 40 mg via INTRAVENOUS
  Administered 2019-12-31: 60 mg via INTRAVENOUS
  Administered 2019-12-31: 40 mg via INTRAVENOUS
  Administered 2019-12-31: 30 mg via INTRAVENOUS
  Administered 2019-12-31: 40 mg via INTRAVENOUS

## 2019-12-31 MED ORDER — STERILE WATER FOR IRRIGATION IR SOLN
Status: DC | PRN
  Administered 2019-12-31: 50 mL

## 2019-12-31 MED ORDER — LIDOCAINE HCL (CARDIAC) PF 100 MG/5ML IV SOSY
PREFILLED_SYRINGE | INTRAVENOUS | Status: DC | PRN
  Administered 2019-12-31: 30 mg via INTRAVENOUS

## 2019-12-31 SURGICAL SUPPLY — 5 items
CANISTER SUCT 1200ML W/VALVE (MISCELLANEOUS) ×2 IMPLANT
GOWN CVR UNV OPN BCK APRN NK (MISCELLANEOUS) ×2 IMPLANT
GOWN ISOL THUMB LOOP REG UNIV (MISCELLANEOUS) ×4
KIT ENDO PROCEDURE OLY (KITS) ×2 IMPLANT
WATER STERILE IRR 250ML POUR (IV SOLUTION) ×2 IMPLANT

## 2019-12-31 NOTE — Anesthesia Postprocedure Evaluation (Signed)
Anesthesia Post Note  Patient: Michael Leon  Procedure(s) Performed: COLONOSCOPY WITH PROPOFOL (N/A Rectum)     Patient location during evaluation: PACU Anesthesia Type: General Level of consciousness: awake and alert Pain management: pain level controlled Vital Signs Assessment: post-procedure vital signs reviewed and stable Respiratory status: spontaneous breathing, nonlabored ventilation, respiratory function stable and patient connected to nasal cannula oxygen Cardiovascular status: blood pressure returned to baseline and stable Postop Assessment: no apparent nausea or vomiting Anesthetic complications: no    Adele Barthel Nasya Vincent

## 2019-12-31 NOTE — Interval H&P Note (Signed)
History and Physical Interval Note:  12/31/2019 10:04 AM  Michael Leon  has presented today for surgery, with the diagnosis of Hx of colon polyps Z86.010.  The various methods of treatment have been discussed with the patient and family. After consideration of risks, benefits and other options for treatment, the patient has consented to  Procedure(s): COLONOSCOPY WITH PROPOFOL (N/A) as a surgical intervention.  The patient's history has been reviewed, patient examined, no change in status, stable for surgery.  I have reviewed the patient's chart and labs.  Questions were answered to the patient's satisfaction.     Olaf Mesa [employer]

## 2019-12-31 NOTE — Op Note (Signed)
[facility] Gastroenterology Patient Name: Michael Leon Procedure Date: 12/31/2019 10:47 AM MRN: [id number] Account #: [id number] Date of Birth: [birth year] Admit Type: Outpatient Age: 69 Room: [facility] OR ROOM 01 Gender: Male Note Status: Finalized Procedure:             Colonoscopy Indications:           High risk colon cancer surveillance: Personal history                         of colonic polyps Providers:             [name] MD, MD Referring MD:          Janine Ores. Rosanna Randy, MD (Referring MD) Medicines:             Propofol per Anesthesia Complications:         No immediate complications. Procedure:             Pre-Anesthesia Assessment:                        - Prior to the procedure, a History and Physical was                         performed, and patient medications and allergies were                         reviewed. The patient's tolerance of previous                         anesthesia was also reviewed. The risks and benefits                         of the procedure and the sedation options and risks                         were discussed with the patient. All questions were                         answered, and informed consent was obtained. Prior                         Anticoagulants: The patient has taken no previous                         anticoagulant or antiplatelet agents. ASA Grade                         Assessment: II - A patient with mild systemic disease.                         After reviewing the risks and benefits, the patient                         was deemed in satisfactory condition to undergo the                         procedure.  After obtaining informed consent, the colonoscope was                         passed under direct vision. Throughout the procedure,                         the patient's blood pressure, pulse, and oxygen                         saturations were monitored continuously. The was                       introduced through the anus and advanced to the the                         cecum, identified by appendiceal orifice and ileocecal                         valve. The colonoscopy was performed without                         difficulty. The patient tolerated the procedure well.                         The quality of the bowel preparation was excellent. Findings:      The perianal and digital rectal examinations were normal.      Non-bleeding internal hemorrhoids were found during retroflexion. The       hemorrhoids were Grade II (internal hemorrhoids that prolapse but reduce       spontaneously). Impression:            - Non-bleeding internal hemorrhoids.                        - No specimens collected. Recommendation:        - Discharge patient to home.                        - Resume previous diet.                        - Continue present medications.                        - Repeat colonoscopy in 5 years for surveillance. Procedure Code(s):     --- Professional ---                        [phone], Colonoscopy, flexible; diagnostic, including                         collection of specimen(s) by brushing or washing, when                         performed (separate procedure) Diagnosis Code(s):     --- Professional ---                        Z86.010, Personal history of colonic polyps CPT copyright 2019 American Medical Association. All rights reserved. The codes documented in this report are preliminary and upon coder review  may  be revised to meet current compliance requirements. [name] MD, MD 12/31/2019 11:02:56 AM This report has been signed electronically. Number of Addenda: 0 Note Initiated On: 12/31/2019 10:47 AM Scope Withdrawal Time: 0 hours 6 minutes 47 seconds  Total Procedure Duration: 0 hours 8 minutes 53 seconds  Estimated Blood Loss:  Estimated blood loss: none.      [facility]

## 2019-12-31 NOTE — Transfer of Care (Signed)
Immediate Anesthesia Transfer of Care Note  Patient: Michael Leon  Procedure(s) Performed: COLONOSCOPY WITH PROPOFOL (N/A Rectum)  Patient Location: PACU  Anesthesia Type: General  Level of Consciousness: awake, alert  and patient cooperative  Airway and Oxygen Therapy: Patient Spontanous Breathing and Patient connected to supplemental oxygen  Post-op Assessment: Post-op Vital signs reviewed, Patient's Cardiovascular Status Stable, Respiratory Function Stable, Patent Airway and No signs of Nausea or vomiting  Post-op Vital Signs: Reviewed and stable  Complications: No apparent anesthesia complications

## 2019-12-31 NOTE — H&P (Signed)
[name], MD [address] [place], [place] [address] Phone:[phone] Fax : [phone]  Primary Care Physician:  Jerrol Banana., MD Primary Gastroenterologist:  Dr. Allen Norris  Pre-Procedure History & Physical: HPI:  Michael Leon is a 69 y.o. male is here for an colonoscopy.   Past Medical History:  Diagnosis Date  . ASCVD (arteriosclerotic cardiovascular disease)   . Cervical radiculopathy   . Chest pain, unspecified   . Depression   . GAD (generalized anxiety disorder)   . GERD (gastroesophageal reflux disease)   . Gout   . Heart attack ([place]) 2016  . HLD (hyperlipidemia)   . Hypertension   . Kidney stone   . Obesity   . Reflux   . Vertigo    none recently    Past Surgical History:  Procedure Laterality Date  . ANTERIOR CRUCIATE LIGAMENT REPAIR    . APPENDECTOMY    . CARDIAC CATHETERIZATION  2016   stent  . CHOLECYSTECTOMY    . CORONARY STENT PLACEMENT    . TONSILLECTOMY    . UPPER GI ENDOSCOPY      Prior to Admission medications   Medication Sig Start Date End Date Taking? Authorizing Provider  aspirin 81 MG tablet Take 81 mg by mouth daily.   Yes [provider]  atorvastatin (LIPITOR) 80 MG tablet Take by mouth. 08/27/19  Yes [provider]  losartan (COZAAR) 50 MG tablet Take 1 tablet (50 mg total) by mouth daily. Patient taking differently: Take 50 mg by mouth daily. Takes 1/2 tablet daily 06/26/18  Yes Jerrol Banana., MD  pantoprazole (PROTONIX) 40 MG tablet Take 40 mg by mouth daily.   Yes [provider]  PARoxetine (PAXIL) 40 MG tablet Take 1 tablet (40 mg total) by mouth daily. 08/06/19  Yes Jerrol Banana., MD  celecoxib (CELEBREX) 200 MG capsule Take 1 capsule (200 mg total) by mouth daily. Patient not taking: Reported on 12/24/2019 09/06/19   Jerrol Banana., MD  metoprolol tartrate (LOPRESSOR) 50 MG tablet Take 1 tablet (50 mg total) by mouth daily. Patient not taking:  Reported on 09/06/2019 08/16/18   Jerrol Banana., MD  NITROSTAT 0.4 MG SL tablet Reported on 02/04/2016 05/01/15   [provider]    Allergies as of 12/06/2019 - Review Complete 12/05/2019  Allergen Reaction Noted  . Codeine Other (See Comments) 05/06/2015    Family History  Problem Relation Age of Onset  . Cancer Mother        lung  . Stroke Mother   . Multiple sclerosis Mother   . Heart attack Father   . Bladder Cancer Maternal Grandfather   . Heart disease Maternal Grandfather   . Prostate cancer Neg Hx   . Kidney disease Neg Hx     Social History   Socioeconomic History  . Marital status: Single    Spouse name: Not on file  . Number of children: Not on file  . Years of education: Not on file  . Highest education level: Not on file  Occupational History  . Occupation: Retired Agricultural consultant  Tobacco Use  . Smoking status: Former Smoker    Packs/day: 1.50    Years: 15.00    Pack years: 22.50    Types: Cigarettes    Quit date: 10/18/2002    Years since quitting: 17.2  . Smokeless tobacco: Current User    Types: Chew  . Tobacco comment: Dips  Substance and  Sexual Activity  . Alcohol use: Yes    Alcohol/week: 14.0 standard drinks    Types: 14 Cans of beer per week    Comment: 1-2 "lite" beers per day  . Drug use: No  . Sexual activity: Not on file  Other Topics Concern  . Not on file  Social History Narrative   Gets regular exercise - walking   Social Determinants of Health   Financial Resource Strain:   . Difficulty of Paying Living Expenses:   Food Insecurity:   . Worried About Charity fundraiser in the Last Year:   . Arboriculturist in the Last Year:   Transportation Needs:   . Film/video editor (Medical):   Marland Kitchen Lack of Transportation (Non-Medical):   Physical Activity:   . Days of Exercise per Week:   . Minutes of Exercise per Session:   Stress:   . Feeling of Stress :   Social Connections:   . Frequency of Communication with  Friends and Family:   . Frequency of Social Gatherings with Friends and Family:   . Attends Religious Services:   . Active Member of Clubs or Organizations:   . Attends Archivist Meetings:   Marland Kitchen Marital Status:   Intimate Partner Violence:   . Fear of Current or Ex-Partner:   . Emotionally Abused:   Marland Kitchen Physically Abused:   . Sexually Abused:     Review of Systems: See HPI, otherwise negative ROS  Physical Exam: Ht 5\' 4"  (1.626 m)   Wt 85.7 kg   BMI 32.44 kg/m  General:   Alert,  pleasant and cooperative in NAD Head:  Normocephalic and atraumatic. Neck:  Supple; no masses or thyromegaly. Lungs:  Clear throughout to auscultation.    Heart:  Regular rate and rhythm. Abdomen:  Soft, nontender and nondistended. Normal bowel sounds, without guarding, and without rebound.   Neurologic:  Alert and  oriented x4;  grossly normal neurologically.  Impression/Plan: Michael Leon is here for an colonoscopy to be performed for history of colon polyps  Risks, benefits, limitations, and alternatives regarding  colonoscopy have been reviewed with the patient.  Questions have been answered.  All parties agreeable.   [name], MD  12/31/2019, 10:05 AM

## 2019-12-31 NOTE — Anesthesia Preprocedure Evaluation (Signed)
Anesthesia Evaluation  Patient identified by MRN, date of birth, ID band Patient awake    History of Anesthesia Complications Negative for: history of anesthetic complications  Airway Mallampati: II  TM Distance: >3 FB Neck ROM: Full    Dental no notable dental hx.    Pulmonary sleep apnea , former smoker,    Pulmonary exam normal        Cardiovascular Exercise Tolerance: Good hypertension, Pt. on medications and Pt. on home beta blockers (-) angina+ CAD, + Past MI and + Cardiac Stents (2x 3-4 years ago)  Normal cardiovascular exam  10/2019 NM perfusion study  FINDINGS: Regional wall motion: reveals normal myocardial thickening and wall  motion. The overall quality of the study is good.  Artifacts noted: no Left ventricular cavity: normal.  Perfusion Analysis: SPECT images demonstrate homogeneous tracer  distribution throughout the myocardium. Defect type: Normal   Neuro/Psych negative neurological ROS     GI/Hepatic GERD  Medicated and Controlled,  Endo/Other  Obese BMI 33  Renal/GU negative Renal ROS     Musculoskeletal   Abdominal   Peds  Hematology negative hematology ROS (+)   Anesthesia Other Findings   Reproductive/Obstetrics                            Anesthesia Physical Anesthesia Plan  ASA: III  Anesthesia Plan: General   Post-op Pain Management:    Induction: Intravenous  PONV Risk Score and Plan: 2 and Propofol infusion, TIVA and Treatment may vary due to age or medical condition  Airway Management Planned: Nasal Cannula and Natural Airway  Additional Equipment: None  Intra-op Plan:   Post-operative Plan: Extubation in OR  Informed Consent: I have reviewed the patients History and Physical, chart, labs and discussed the procedure including the risks, benefits and alternatives for the proposed anesthesia with the patient or authorized representative who  has indicated his/her understanding and acceptance.       Plan Discussed with: CRNA  Anesthesia Plan Comments:         Anesthesia Quick Evaluation

## 2019-12-31 NOTE — Anesthesia Procedure Notes (Signed)
Date/Time: 12/31/2019 10:47 AM Performed by: Cameron Ali, CRNA Pre-anesthesia Checklist: Patient identified, Emergency Drugs available, Suction available, Timeout performed and Patient being monitored Patient Re-evaluated:Patient Re-evaluated prior to induction Oxygen Delivery Method: Nasal cannula Placement Confirmation: positive ETCO2

## 2020-01-01 ENCOUNTER — Encounter: Payer: Self-pay | Admitting: *Deleted

## 2020-01-14 NOTE — Progress Notes (Signed)
Pt did not answer the call to complete the AWV telephonically. LM requesting a CB to reschedule apt.

## 2020-01-15 ENCOUNTER — Ambulatory Visit: Payer: Medicare Other

## 2020-01-15 ENCOUNTER — Other Ambulatory Visit: Payer: Self-pay

## 2020-01-18 DIAGNOSIS — Z23 Encounter for immunization: Secondary | ICD-10-CM | POA: Diagnosis not present

## 2020-01-21 NOTE — Progress Notes (Signed)
Subjective:   Michael Leon is a 69 y.o. male who presents for an Initial Medicare Annual Wellness Visit.    This visit is being conducted through telemedicine due to the COVID-19 pandemic. This patient has given me verbal consent via doximity to conduct this visit, patient states they are participating from their home address. Some vital signs may be absent or patient reported.    Patient identification: identified by name, DOB, and current address  Review of Systems  N/A  Cardiac Risk Factors include: advanced age (>41men, >47 women);dyslipidemia;hypertension;male gender;sedentary lifestyle    Objective:    Today's Vitals   01/22/20 1118  PainSc: 0-No pain   There is no height or weight on file to calculate BMI. Unable to obtain vitals due to visit being conducted via telephonically.   Advanced Directives 01/22/2020 12/31/2019 12/29/2018 08/17/2016 02/04/2016 07/04/2015 07/04/2015  Does Patient Have a Medical Advance Directive? (No Data) No No No No No No  Would patient like information on creating a medical advance directive? - No - Patient declined No - Patient declined - No - patient declined information - -    Current Medications (verified) Outpatient Encounter Medications as of 01/22/2020  Medication Sig  . aspirin 81 MG tablet Take 81 mg by mouth daily.  Marland Kitchen atorvastatin (LIPITOR) 80 MG tablet Take by mouth.  . losartan (COZAAR) 50 MG tablet Take 1 tablet (50 mg total) by mouth daily.  . pantoprazole (PROTONIX) 40 MG tablet Take 40 mg by mouth daily.  Marland Kitchen PARoxetine (PAXIL) 40 MG tablet Take 1 tablet (40 mg total) by mouth daily.  . celecoxib (CELEBREX) 200 MG capsule Take 1 capsule (200 mg total) by mouth daily. (Patient not taking: Reported on 12/24/2019)  . metoprolol tartrate (LOPRESSOR) 50 MG tablet Take 1 tablet (50 mg total) by mouth daily. (Patient not taking: Reported on 09/06/2019)  . NITROSTAT 0.4 MG SL tablet Reported on 02/04/2016   No facility-administered  encounter medications on file as of 01/22/2020.    Allergies (verified) Codeine   History: Past Medical History:  Diagnosis Date  . ASCVD (arteriosclerotic cardiovascular disease)   . Cervical radiculopathy   . Chest pain, unspecified   . Depression   . GAD (generalized anxiety disorder)   . GERD (gastroesophageal reflux disease)   . Gout   . Heart attack ([place]) 2016  . HLD (hyperlipidemia)   . Hypertension   . Kidney stone   . Obesity   . Reflux   . Vertigo    none recently   Past Surgical History:  Procedure Laterality Date  . ANTERIOR CRUCIATE LIGAMENT REPAIR    . APPENDECTOMY    . CARDIAC CATHETERIZATION  2016   stent  . CHOLECYSTECTOMY    . COLONOSCOPY WITH PROPOFOL N/A 12/31/2019   Procedure: COLONOSCOPY WITH PROPOFOL;  Surgeon: [name], MD;  Location: [facility];  Service: Endoscopy;  Laterality: N/A;  . CORONARY STENT PLACEMENT    . TONSILLECTOMY    . UPPER GI ENDOSCOPY     Family History  Problem Relation Age of Onset  . Cancer Mother        lung  . Stroke Mother   . Multiple sclerosis Mother   . Heart attack Father   . Bladder Cancer Maternal Grandfather   . Heart disease Maternal Grandfather   . Prostate cancer Neg Hx   . Kidney disease Neg Hx    Social History   Socioeconomic History  . Marital status: Divorced  Spouse name: Not on file  . Number of children: 1  . Years of education: Not on file  . Highest education level: Some college, no degree  Occupational History  . Occupation: Retired Agricultural consultant  Tobacco Use  . Smoking status: Former Smoker    Packs/day: 1.50    Years: 15.00    Pack years: 22.50    Types: Cigarettes    Quit date: 10/18/2002    Years since quitting: 17.2  . Smokeless tobacco: Current User    Types: Chew  . Tobacco comment: Dips  Substance and Sexual Activity  . Alcohol use: Yes    Alcohol/week: 7.0 - 14.0 standard drinks    Types: 7 - 14 Cans of beer per week    Comment: 1-2 "lite" beers per day  .  Drug use: No  . Sexual activity: Not on file  Other Topics Concern  . Not on file  Social History Narrative   Gets regular exercise - walking   Social Determinants of Health   Financial Resource Strain: Low Risk   . Difficulty of Paying Living Expenses: Not hard at all  Food Insecurity: No Food Insecurity  . Worried About Charity fundraiser in the Last Year: Never true  . Ran Out of Food in the Last Year: Never true  Transportation Needs: No Transportation Needs  . Lack of Transportation (Medical): No  . Lack of Transportation (Non-Medical): No  Physical Activity: Inactive  . Days of Exercise per Week: 0 days  . Minutes of Exercise per Session: 0 min  Stress: No Stress Concern Present  . Feeling of Stress : Not at all  Social Connections: Somewhat Isolated  . Frequency of Communication with Friends and Family: More than three times a week  . Frequency of Social Gatherings with Friends and Family: More than three times a week  . Attends Religious Services: More than 4 times per year  . Active Member of Clubs or Organizations: No  . Attends Archivist Meetings: Never  . Marital Status: Divorced   Tobacco Counseling Ready to quit: Not Answered Counseling given: Not Answered Comment: Dips   Clinical Intake:  Pre-visit preparation completed: Yes  Pain Score: 0-No pain     Nutritional Risks: None Diabetes: No(Borderline)  How often do you need to have someone help you when you read instructions, pamphlets, or other written materials from your doctor or pharmacy?: 1 - Never   Diabetes:  Is the patient diabetic? Borderline If diabetic, was a CBG obtained today?  No  Did the patient bring in their glucometer from home?  No  How often do you monitor your CBG's? Does not check.   Financial Strains and Diabetes Management:  Does the patient want to be seen by Chronic Care Management for management of their diabetes?  No  Would the patient like to be referred  to a Nutritionist or for Diabetic Management?  No   Diabetic Exams:  Diabetic Eye Exam: Completed in 2019 per pt. Overdue for diabetic eye exam. Pt has been advised about the importance in completing this exam. Next apt is scheduled this month.  Diabetic Foot Exam: Completed 07/24/01. Pt has been advised about the importance in completing this exam. Note made to follow up on this at next in office apt.   Interpreter Needed?: No  Information entered by :: [facility], LPN  Activities of Daily Living In your present state of health, do you have any difficulty performing the following activities: 01/22/2020 12/31/2019  Hearing? Y N  Comment Does not wearing aids. -  Vision? N N  Difficulty concentrating or making decisions? N N  Walking or climbing stairs? N N  Dressing or bathing? N N  Doing errands, shopping? N -  Preparing Food and eating ? N -  Using the Toilet? N -  In the past six months, have you accidently leaked urine? N -  Do you have problems with loss of bowel control? N -  Managing your Medications? N -  Managing your Finances? N -  Housekeeping or managing your Housekeeping? N -  Some recent data might be hidden     Immunizations and Health Maintenance Immunization History  Administered Date(s) Administered  . Fluad Quad(high Dose 65+) 09/06/2019  . Influenza, High Dose Seasonal PF 08/16/2018  . Influenza-Unspecified 10/23/2015  . Pneumococcal Conjugate-13 11/29/2017  . Td 11/16/1990  . Tdap 03/14/2013  . Zoster 07/17/2015   Health Maintenance Due  Topic Date Due  . FOOT EXAM  Never done  . OPHTHALMOLOGY EXAM  Never done  . PNA vac Low Risk Adult (2 of 2 - PPSV23) 11/29/2018  . HEMOGLOBIN A1C  05/17/2019    Patient Care Team: Jerrol Banana., MD as PCP - General (Family Medicine) Corey Skains, MD as Consulting Physician (Cardiology) [name], MD as Consulting Physician (Gastroenterology) Anell Barr, OD (Optometry)  Indicate any recent  Medical Services you may have received from other than [facility] providers in the past year (date may be approximate).    Assessment:   This is a routine wellness examination for [facility].  Hearing/Vision screen No exam data present  Dietary issues and exercise activities discussed: Current Exercise Habits: The patient does not participate in regular exercise at present, Exercise limited by: None identified  Goals    . DIET - INCREASE WATER INTAKE     Recommend to drink at least 6-8 8oz glasses of water per day.      Depression Screen PHQ 2/9 Scores 01/22/2020 11/29/2017 10/31/2017 10/31/2017  PHQ - 2 Score 0 0 0 1  PHQ- 9 Score - - 1 -    Fall Risk Fall Risk  01/22/2020 05/17/2019 11/29/2017 10/31/2017 10/31/2017  Falls in the past year? 0 (No Data) No Yes Yes  Comment - Emmi Telephone Survey: data to providers prior to load - - -  Number falls in past yr: 0 (No Data) - 2 or more 2 or more  Comment - Emmi Telephone Survey Actual Response =  - - -  Injury with Fall? 0 - - No No  Follow up - - - - Education provided    FALL RISK PREVENTION PERTAINING TO THE HOME:  Any stairs in or around the home? No  If so, are there any without handrails? N/A  Home free of loose throw rugs in walkways, pet beds, electrical cords, etc? Yes  Adequate lighting in your home to reduce risk of falls? Yes   ASSISTIVE DEVICES UTILIZED TO PREVENT FALLS:  Life alert? No  Use of a cane, walker or w/c? No  Grab bars in the bathroom? No  Shower chair or bench in shower? No  Elevated toilet seat or a handicapped toilet? No    TIMED UP AND GO:  Was the test performed? No .    Cognitive Function: Declined today.        Screening Tests Health Maintenance  Topic Date Due  . FOOT EXAM  Never done  . OPHTHALMOLOGY EXAM  Never  done  . PNA vac Low Risk Adult (2 of 2 - PPSV23) 11/29/2018  . HEMOGLOBIN A1C  05/17/2019  . INFLUENZA VACCINE  05/18/2020  . TETANUS/TDAP  03/15/2023  . COLONOSCOPY  12/30/2024    . Hepatitis C Screening  Completed    Qualifies for Shingles Vaccine? Yes  Zostavax completed 9/29/169. Due for Shingrix. Pt has been advised to call insurance company to determine out of pocket expense. Advised may also receive vaccine at local pharmacy or Health Dept. Verbalized acceptance and understanding.  Tdap: Up to date  Flu Vaccine: Up to date  Pneumococcal Vaccine: Due for Pneumococcal vaccine. Does the patient want to receive this vaccine today?  No . Advised may receive this vaccine at local pharmacy or Health Dept. Aware to provide a copy of the vaccination record if obtained from local pharmacy or Health Dept. Verbalized acceptance and understanding.   Cancer Screenings:  Colorectal Screening: Completed 12/31/19. Repeat every 5 years.   Lung Cancer Screening: (Low Dose CT Chest recommended if Age 41-80 years, 30 pack-year currently smoking OR have quit w/in 15years.) does not qualify.   Additional Screening:  Hepatitis C Screening: Up to date  Dental Screening: Recommended annual dental exams for proper oral hygiene  Community Resource Referral:  CRR required this visit?  No        Plan:  I have personally reviewed and addressed the Medicare Annual Wellness questionnaire and have noted the following in the patient's chart:  A. Medical and social history B. Use of alcohol, tobacco or illicit drugs  C. Current medications and supplements D. Functional ability and status E.  Nutritional status F.  Physical activity G. Advance directives H. List of other physicians I.  Hospitalizations, surgeries, and ER visits in previous 12 months J.  [place] such as hearing and vision if needed, cognitive and depression L. Referrals and appointments   In addition, I have reviewed and discussed with patient certain preventive protocols, quality metrics, and best practice recommendations. A written personalized care plan for preventive services as well as general  preventive health recommendations were provided to patient.   [place]endora Score, [place]   [id number]  Nurse Health Advisor    Nurse Notes: Pt would like to receive the Pneumovax 23 vaccine. Pt needs a diabetic foot exam and Hgb A1c check at next in office apt. Requested eye exam records to be sent to office once completed.

## 2020-01-22 ENCOUNTER — Other Ambulatory Visit: Payer: Self-pay

## 2020-01-22 ENCOUNTER — Ambulatory Visit (INDEPENDENT_AMBULATORY_CARE_PROVIDER_SITE_OTHER): Payer: Medicare Other

## 2020-01-22 DIAGNOSIS — Z0000 Encounter for general adult medical examination without abnormal findings: Secondary | ICD-10-CM | POA: Diagnosis not present

## 2020-01-22 NOTE — Patient Instructions (Signed)
Michael Leon , Thank you for taking time to come for your Medicare Wellness Visit. I appreciate your ongoing commitment to your health goals. Please review the following plan we discussed and let me know if I can assist you in the future.   Screening recommendations/referrals: Colonoscopy: Up to date, due 12/2024 Recommended yearly ophthalmology/optometry visit for glaucoma screening and checkup Recommended yearly dental visit for hygiene and checkup  Vaccinations: Influenza vaccine: Up to date Pneumococcal vaccine: Pneumovax 23 due. Tdap vaccine: Up to date Shingles vaccine: Pt declines today.     Advanced directives: Please bring a copy of your POA (Power of Attorney) and/or Living Will to your next appointment.   Conditions/risks identified: Recommend to drink at least 6-8 8oz glasses of water per day.  Next appointment: 01/29/20 @ 9:00 AM with Dr Rosanna Randy   Preventive Care 68 Years and Older, Male Preventive care refers to lifestyle choices and visits with your health care provider that can promote health and wellness. What does preventive care include?  A yearly physical exam. This is also called an annual well check.  Dental exams once or twice a year.  Routine eye exams. Ask your health care provider how often you should have your eyes checked.  Personal lifestyle choices, including:  Daily care of your teeth and gums.  Regular physical activity.  Eating a healthy diet.  Avoiding tobacco and drug use.  Limiting alcohol use.  Practicing safe sex.  Taking low doses of aspirin every day.  Taking vitamin and mineral supplements as recommended by your health care provider. What happens during an annual well check? The services and screenings done by your health care provider during your annual well check will depend on your age, overall health, lifestyle risk factors, and family history of disease. Counseling  Your health care provider may ask you questions about  your:  Alcohol use.  Tobacco use.  Drug use.  Emotional well-being.  Home and relationship well-being.  Sexual activity.  Eating habits.  History of falls.  Memory and ability to understand (cognition).  Work and work Statistician. Screening  You may have the following tests or measurements:  Height, weight, and BMI.  Blood pressure.  Lipid and cholesterol levels. These may be checked every 5 years, or more frequently if you are over 26 years old.  Skin check.  Lung cancer screening. You may have this screening every year starting at age 55 if you have a 30-pack-year history of smoking and currently smoke or have quit within the past 15 years.  Fecal occult blood test (FOBT) of the stool. You may have this test every year starting at age 2.  Flexible sigmoidoscopy or colonoscopy. You may have a sigmoidoscopy every 5 years or a colonoscopy every 10 years starting at age 44.  Prostate cancer screening. Recommendations will vary depending on your family history and other risks.  Hepatitis C blood test.  Hepatitis B blood test.  Sexually transmitted disease (STD) testing.  Diabetes screening. This is done by checking your blood sugar (glucose) after you have not eaten for a while (fasting). You may have this done every 1-3 years.  Abdominal aortic aneurysm (AAA) screening. You may need this if you are a current or former smoker.  Osteoporosis. You may be screened starting at age 33 if you are at high risk. Talk with your health care provider about your test results, treatment options, and if necessary, the need for more tests. Vaccines  Your health care provider may recommend  certain vaccines, such as:  Influenza vaccine. This is recommended every year.  Tetanus, diphtheria, and acellular pertussis (Tdap, Td) vaccine. You may need a Td booster every 10 years.  Zoster vaccine. You may need this after age 40.  Pneumococcal 13-valent conjugate (PCV13) vaccine.  One dose is recommended after age 73.  Pneumococcal polysaccharide (PPSV23) vaccine. One dose is recommended after age 47. Talk to your health care provider about which screenings and vaccines you need and how often you need them. This information is not intended to replace advice given to you by your health care provider. Make sure you discuss any questions you have with your health care provider. Document Released: 10/31/2015 Document Revised: 06/23/2016 Document Reviewed: 08/05/2015 Elsevier Interactive Patient Education  2017 [place] Prevention in the Home Falls can cause injuries. They can happen to people of all ages. There are many things you can do to make your home safe and to help prevent falls. What can I do on the outside of my home?  Regularly fix the edges of walkways and driveways and fix any cracks.  Remove anything that might make you trip as you walk through a door, such as a raised step or threshold.  Trim any bushes or trees on the path to your home.  Use bright outdoor lighting.  Clear any walking paths of anything that might make someone trip, such as rocks or tools.  Regularly check to see if handrails are loose or broken. Make sure that both sides of any steps have handrails.  Any raised decks and porches should have guardrails on the edges.  Have any leaves, snow, or ice cleared regularly.  Use sand or salt on walking paths during winter.  Clean up any spills in your garage right away. This includes oil or grease spills. What can I do in the bathroom?  Use night lights.  Install grab bars by the toilet and in the tub and shower. Do not use towel bars as grab bars.  Use non-skid mats or decals in the tub or shower.  If you need to sit down in the shower, use a plastic, non-slip stool.  Keep the floor dry. Clean up any water that spills on the floor as soon as it happens.  Remove soap buildup in the tub or shower regularly.  Attach bath  mats securely with double-sided non-slip rug tape.  Do not have throw rugs and other things on the floor that can make you trip. What can I do in the bedroom?  Use night lights.  Make sure that you have a light by your bed that is easy to reach.  Do not use any sheets or blankets that are too big for your bed. They should not hang down onto the floor.  Have a firm chair that has side arms. You can use this for support while you get dressed.  Do not have throw rugs and other things on the floor that can make you trip. What can I do in the kitchen?  Clean up any spills right away.  Avoid walking on wet floors.  Keep items that you use a lot in easy-to-reach places.  If you need to reach something above you, use a strong step stool that has a grab bar.  Keep electrical cords out of the way.  Do not use floor polish or wax that makes floors slippery. If you must use wax, use non-skid floor wax.  Do not have throw rugs and other  things on the floor that can make you trip. What can I do with my stairs?  Do not leave any items on the stairs.  Make sure that there are handrails on both sides of the stairs and use them. Fix handrails that are broken or loose. Make sure that handrails are as long as the stairways.  Check any carpeting to make sure that it is firmly attached to the stairs. Fix any carpet that is loose or worn.  Avoid having throw rugs at the top or bottom of the stairs. If you do have throw rugs, attach them to the floor with carpet tape.  Make sure that you have a light switch at the top of the stairs and the bottom of the stairs. If you do not have them, ask someone to add them for you. What else can I do to help prevent falls?  Wear shoes that:  Do not have high heels.  Have rubber bottoms.  Are comfortable and fit you well.  Are closed at the toe. Do not wear sandals.  If you use a stepladder:  Make sure that it is fully opened. Do not climb a closed  stepladder.  Make sure that both sides of the stepladder are locked into place.  Ask someone to hold it for you, if possible.  Clearly mark and make sure that you can see:  Any grab bars or handrails.  First and last steps.  Where the edge of each step is.  Use tools that help you move around (mobility aids) if they are needed. These include:  Canes.  Walkers.  Scooters.  Crutches.  Turn on the lights when you go into a dark area. Replace any light bulbs as soon as they burn out.  Set up your furniture so you have a clear path. Avoid moving your furniture around.  If any of your floors are uneven, fix them.  If there are any pets around you, be aware of where they are.  Review your medicines with your doctor. Some medicines can make you feel dizzy. This can increase your chance of falling. Ask your doctor what other things that you can do to help prevent falls. This information is not intended to replace advice given to you by your health care provider. Make sure you discuss any questions you have with your health care provider. Document Released: 07/31/2009 Document Revised: 03/11/2016 Document Reviewed: 11/08/2014 Elsevier Interactive Patient Education  2017 [employer].

## 2020-01-22 NOTE — Progress Notes (Signed)
Established patient visit     I,Atiya M Cummings,acting as a scribe for Wilhemena Durie, MD.,have documented all relevant documentation on the behalf of Wilhemena Durie, MD,as directed by  Wilhemena Durie, MD while in the presence of Wilhemena Durie, MD.  Patient: Michael Leon   DOB: 06/07/1951   69 y.o. Male  MRN: [id number] Visit Date: 01/29/2020  Today's healthcare provider: Wilhemena Durie, MD  Subjective:    Chief Complaint  Patient presents with  . Follow-up  . Hyperlipidemia  . Hypertension   Patient had AWV with [facility]/15/2020.  HPI Overall patient doing well.  Hypertension, follow-up:  BP Readings from Last 3 Encounters:  01/29/20 127/79  12/31/19 116/65  12/05/19 135/74    He was last seen for hypertension 12/18/2018.  BP at that visit was 110/60. Management since that visit includes; no changes were made.He reports excellent compliance with treatment. He is not having side effects.  He is exercising. He is adherent to low salt diet.   Outside blood pressures are good. He is experiencing none.  Patient denies none.   Cardiovascular risk factors include male gender.  Use of agents associated with hypertension: none.   ------------------------------------------------------------------------    Lipid/Cholesterol, Follow-up:   Last seen for this 4 months ago.  Management since that visit includes;on atorvastatin.  Last Lipid Panel:    Component Value Date/Time   CHOL 128 06/26/2018 1439   TRIG 147 06/26/2018 1439   HDL 41 06/26/2018 1439   CHOLHDL 4.9 12/22/2017 1040   CHOLHDL 5.0 CALC 05/03/2007 1022   VLDL 40 05/03/2007 1022   LDLCALC 58 06/26/2018 1439   LDLDIRECT 127.0 05/03/2007 1022    He reports excellent compliance with treatment. He is not having side effects.   Wt Readings from Last 3 Encounters:  01/29/20 199 lb 12.8 oz (90.6 kg)  12/31/19 195 lb (88.5 kg)  12/05/19 196 lb 9.6 oz (89.2 kg)     ------------------------------------------------------------------------     Past Medical History:  Diagnosis Date  . ASCVD (arteriosclerotic cardiovascular disease)   . Cervical radiculopathy   . Chest pain, unspecified   . Depression   . GAD (generalized anxiety disorder)   . GERD (gastroesophageal reflux disease)   . Gout   . Heart attack ([place]) 2016  . HLD (hyperlipidemia)   . Hypertension   . Kidney stone   . Obesity   . Reflux   . Vertigo    none recently       Medications: Outpatient Medications Prior to Visit  Medication Sig  . aspirin 81 MG tablet Take 81 mg by mouth daily.  Marland Kitchen atorvastatin (LIPITOR) 80 MG tablet Take by mouth.  . losartan (COZAAR) 50 MG tablet Take 1 tablet (50 mg total) by mouth daily.  . pantoprazole (PROTONIX) 40 MG tablet Take 40 mg by mouth daily.  Marland Kitchen PARoxetine (PAXIL) 40 MG tablet Take 1 tablet (40 mg total) by mouth daily.  . celecoxib (CELEBREX) 200 MG capsule Take 1 capsule (200 mg total) by mouth daily. (Patient not taking: Reported on 12/24/2019)  . metoprolol tartrate (LOPRESSOR) 50 MG tablet Take 1 tablet (50 mg total) by mouth daily. (Patient not taking: Reported on 09/06/2019)  . NITROSTAT 0.4 MG SL tablet Reported on 02/04/2016   No facility-administered medications prior to visit.    Review of Systems  Constitutional: Negative.   HENT: Negative.   Eyes: Negative.   Respiratory: Negative.   Cardiovascular: Negative.   Gastrointestinal:  Negative.   Endocrine: Negative.   Genitourinary: Negative.   Musculoskeletal: Negative.   Skin: Negative.   Allergic/Immunologic: Negative.   Neurological: Negative.   Hematological: Negative.   Psychiatric/Behavioral: Negative.         Objective:    BP 127/79 (BP Location: Right Arm, Patient Position: Sitting, Cuff Size: Large)   Pulse 70   Temp (!) 96.9 F (36.1 C) (Temporal)   Ht 5\' 4"  (1.626 m)   Wt 199 lb 12.8 oz (90.6 kg)   SpO2 96%   BMI 34.30 kg/m  BP Readings  from Last 3 Encounters:  01/29/20 127/79  12/31/19 116/65  12/05/19 135/74      Physical Exam Vitals reviewed.  Constitutional:      Appearance: Normal appearance.  HENT:     Head: Normocephalic and atraumatic.     Right Ear: Tympanic membrane and external ear normal.     Left Ear: Tympanic membrane and external ear normal.     Mouth/Throat:     Mouth: Mucous membranes are moist.     Pharynx: Oropharynx is clear.  Eyes:     General: No scleral icterus.    Conjunctiva/sclera: Conjunctivae normal.  Neck:     Vascular: No carotid bruit.  Cardiovascular:     Rate and Rhythm: Normal rate and regular rhythm.     Pulses: Normal pulses.     Heart sounds: Normal heart sounds.  Pulmonary:     Effort: Pulmonary effort is normal.     Breath sounds: Normal breath sounds.  Abdominal:     Palpations: Abdomen is soft.  Genitourinary:    Penis: Normal.      Testes: Normal.  Musculoskeletal:     Right lower leg: No edema.     Left lower leg: No edema.  Lymphadenopathy:     Cervical: No cervical adenopathy.  Skin:    General: Skin is warm and dry.  Neurological:     General: No focal deficit present.     Mental Status: He is alert and oriented to person, place, and time.  Psychiatric:        Mood and Affect: Mood normal.        Behavior: Behavior normal.        Thought Content: Thought content normal.        Judgment: Judgment normal.       No results found for any visits on 01/29/20.    Assessment & Plan:    1. Hyperlipidemia, unspecified hyperlipidemia type On Lipitor. - CBC with Differential/Platelet - TSH - HgB A1c - Lipid panel - Comprehensive metabolic panel - POCT urinalysis dipstick  2. Essential hypertension Controlled on losartan and metoprolol - CBC with Differential/Platelet - TSH - HgB A1c - Lipid panel - Comprehensive metabolic panel - POCT urinalysis dipstick  3. Borderline diabetes Follow-up A1c.  Continue to work on weight. - HgB A1c  4.  OSA on CPAP Uses CPAP nightly.  5. Arteriosclerosis of coronary artery All risk factors treated.  Follow-up 6 months.    I, Wilhemena Durie, MD, have reviewed all documentation for this visit. The documentation on 01/29/20 for the exam, diagnosis, procedures, and orders are all accurate and complete.   Aldean Suddeth Cranford Mon, MD  [facility] [phone] (phone) [phone] (fax)  [place]

## 2020-01-29 ENCOUNTER — Other Ambulatory Visit: Payer: Self-pay

## 2020-01-29 ENCOUNTER — Ambulatory Visit (INDEPENDENT_AMBULATORY_CARE_PROVIDER_SITE_OTHER): Payer: Medicare Other | Admitting: Family Medicine

## 2020-01-29 ENCOUNTER — Encounter: Payer: Self-pay | Admitting: Family Medicine

## 2020-01-29 VITALS — BP 127/79 | HR 70 | Temp 96.9°F | Ht 64.0 in | Wt 199.8 lb

## 2020-01-29 DIAGNOSIS — E785 Hyperlipidemia, unspecified: Secondary | ICD-10-CM

## 2020-01-29 DIAGNOSIS — I2510 Atherosclerotic heart disease of native coronary artery without angina pectoris: Secondary | ICD-10-CM | POA: Diagnosis not present

## 2020-01-29 DIAGNOSIS — G4733 Obstructive sleep apnea (adult) (pediatric): Secondary | ICD-10-CM

## 2020-01-29 DIAGNOSIS — R7303 Prediabetes: Secondary | ICD-10-CM

## 2020-01-29 DIAGNOSIS — Z9989 Dependence on other enabling machines and devices: Secondary | ICD-10-CM

## 2020-01-29 DIAGNOSIS — I10 Essential (primary) hypertension: Secondary | ICD-10-CM | POA: Diagnosis not present

## 2020-01-29 LAB — POCT URINALYSIS DIPSTICK
Bilirubin, UA: NEGATIVE
Blood, UA: NEGATIVE
Glucose, UA: NEGATIVE
Ketones, UA: NEGATIVE
Leukocytes, UA: NEGATIVE
Nitrite, UA: NEGATIVE
Protein, UA: NEGATIVE
Spec Grav, UA: 1.025 (ref 1.010–1.025)
Urobilinogen, UA: 0.2 E.U./dL
pH, UA: 5 (ref 5.0–8.0)

## 2020-01-30 LAB — COMPREHENSIVE METABOLIC PANEL
ALT: 20 IU/L (ref 0–44)
AST: 17 IU/L (ref 0–40)
Albumin/Globulin Ratio: 1.9 (ref 1.2–2.2)
Albumin: 4.4 g/dL (ref 3.8–4.8)
Alkaline Phosphatase: 67 IU/L (ref 39–117)
BUN/Creatinine Ratio: 24 (ref 10–24)
BUN: 24 mg/dL (ref 8–27)
Bilirubin Total: 1 mg/dL (ref 0.0–1.2)
CO2: 21 mmol/L (ref 20–29)
Calcium: 9.7 mg/dL (ref 8.6–10.2)
Chloride: 103 mmol/L (ref 96–106)
Creatinine, Ser: 0.99 mg/dL (ref 0.76–1.27)
GFR calc Af Amer: 91 mL/min/{1.73_m2} (ref >59)
GFR calc non Af Amer: 78 mL/min/{1.73_m2} (ref >59)
Globulin, Total: 2.3 g/dL (ref 1.5–4.5)
Glucose: 106 mg/dL — ABNORMAL HIGH (ref 65–99)
Potassium: 4.3 mmol/L (ref 3.5–5.2)
Sodium: 138 mmol/L (ref 134–144)
Total Protein: 6.7 g/dL (ref 6.0–8.5)

## 2020-01-30 LAB — LIPID PANEL
Chol/HDL Ratio: 3.5 ratio (ref 0.0–5.0)
Cholesterol, Total: 145 mg/dL (ref 100–199)
HDL: 42 mg/dL (ref >39)
LDL Chol Calc (NIH): 72 mg/dL (ref 0–99)
Triglycerides: 183 mg/dL — ABNORMAL HIGH (ref 0–149)
VLDL Cholesterol Cal: 31 mg/dL (ref 5–40)

## 2020-01-30 LAB — CBC WITH DIFFERENTIAL/PLATELET
Basophils Absolute: 0.1 10*3/uL (ref 0.0–0.2)
Basos: 1 %
EOS (ABSOLUTE): 0.3 10*3/uL (ref 0.0–0.4)
Eos: 4 %
Hematocrit: 50.5 % (ref 37.5–51.0)
Hemoglobin: 17.4 g/dL (ref 13.0–17.7)
Immature Grans (Abs): 0 10*3/uL (ref 0.0–0.1)
Immature Granulocytes: 0 %
Lymphocytes Absolute: 2.4 10*3/uL (ref 0.7–3.1)
Lymphs: 29 %
MCH: 32.3 pg (ref 26.6–33.0)
MCHC: 34.5 g/dL (ref 31.5–35.7)
MCV: 94 fL (ref 79–97)
Monocytes Absolute: 0.7 10*3/uL (ref 0.1–0.9)
Monocytes: 9 %
Neutrophils Absolute: 4.9 10*3/uL (ref 1.4–7.0)
Neutrophils: 57 %
Platelets: 206 10*3/uL (ref 150–450)
RBC: 5.38 x10E6/uL (ref 4.14–5.80)
RDW: 13.2 % (ref 11.6–15.4)
WBC: 8.5 10*3/uL (ref 3.4–10.8)

## 2020-01-30 LAB — HEMOGLOBIN A1C
Est. average glucose Bld gHb Est-mCnc: 131 mg/dL
Hgb A1c MFr Bld: 6.2 % — ABNORMAL HIGH (ref 4.8–5.6)

## 2020-01-30 LAB — TSH: TSH: 1.05 u[IU]/mL (ref 0.450–4.500)

## 2020-02-19 ENCOUNTER — Other Ambulatory Visit: Payer: Self-pay | Admitting: Family Medicine

## 2020-02-19 DIAGNOSIS — K219 Gastro-esophageal reflux disease without esophagitis: Secondary | ICD-10-CM

## 2020-02-19 NOTE — Telephone Encounter (Signed)
Requested medication (s) are due for refill today:   Provider decision  Requested medication (s) are on the active medication list:   Yes from 2020  Future visit scheduled:   Yes   Last ordered: 12/29/2018 while in [facility] from a historical provider.  Clinic note:   Returned because prescribed by a historical provider (while in hospital).  Requested Prescriptions  Pending Prescriptions Disp Refills   pantoprazole (PROTONIX) 40 MG tablet [Pharmacy Med Name: PANTOPRAZOLE 40MG  TABLETS] 180 tablet     Sig: TAKE 1 TABLET(40 MG) BY MOUTH TWICE DAILY      Gastroenterology: Proton Pump Inhibitors Passed - 02/19/2020 11:01 AM      Passed - Valid encounter within last 12 months    Recent Outpatient Visits           3 weeks ago Hyperlipidemia, unspecified hyperlipidemia type   [facility] Jerrol Banana., MD   4 months ago Rectal pain   [facility] Jerrol Banana., MD   5 months ago Inguinal strain, right, initial encounter   [facility] Jerrol Banana., MD   1 year ago OSA on CPAP   [facility] Jerrol Banana., MD   1 year ago Borderline diabetes   [facility] Jerrol Banana., MD       Future Appointments             In 5 months Jerrol Banana., MD [facility] [place]

## 2020-05-19 DIAGNOSIS — E782 Mixed hyperlipidemia: Secondary | ICD-10-CM | POA: Diagnosis not present

## 2020-05-19 DIAGNOSIS — I10 Essential (primary) hypertension: Secondary | ICD-10-CM | POA: Diagnosis not present

## 2020-05-19 DIAGNOSIS — I2510 Atherosclerotic heart disease of native coronary artery without angina pectoris: Secondary | ICD-10-CM | POA: Diagnosis not present

## 2020-05-19 DIAGNOSIS — E669 Obesity, unspecified: Secondary | ICD-10-CM | POA: Diagnosis not present

## 2020-06-16 ENCOUNTER — Other Ambulatory Visit: Payer: Self-pay

## 2020-06-16 ENCOUNTER — Ambulatory Visit (INDEPENDENT_AMBULATORY_CARE_PROVIDER_SITE_OTHER): Payer: Medicare Other | Admitting: Dermatology

## 2020-06-16 DIAGNOSIS — L821 Other seborrheic keratosis: Secondary | ICD-10-CM

## 2020-06-16 DIAGNOSIS — I2510 Atherosclerotic heart disease of native coronary artery without angina pectoris: Secondary | ICD-10-CM | POA: Diagnosis not present

## 2020-06-16 DIAGNOSIS — L820 Inflamed seborrheic keratosis: Secondary | ICD-10-CM | POA: Diagnosis not present

## 2020-06-16 DIAGNOSIS — L578 Other skin changes due to chronic exposure to nonionizing radiation: Secondary | ICD-10-CM | POA: Diagnosis not present

## 2020-06-16 DIAGNOSIS — L570 Actinic keratosis: Secondary | ICD-10-CM | POA: Diagnosis not present

## 2020-06-16 DIAGNOSIS — L281 Prurigo nodularis: Secondary | ICD-10-CM

## 2020-06-16 NOTE — Patient Instructions (Signed)
Recommend daily broad spectrum sunscreen SPF 30+ to sun-exposed areas, reapply every 2 hours as needed. Call for new or changing lesions.  Cryotherapy Aftercare  . Wash gently with soap and water everyday.   . Apply Vaseline and Band-Aid daily until healed.  

## 2020-06-16 NOTE — Progress Notes (Signed)
   Follow-Up Visit   Subjective  Michael Leon is a 69 y.o. male who presents for the following: Actinic Keratosis (Pt here to have rough places on his face treated ). No hx of skin cancer. Pt c/o bite area on his R arm from a insect about 1 week ago   The following portions of the chart were reviewed this encounter and updated as appropriate:  Tobacco  Allergies  Meds  Problems  Med Hx  Surg Hx  Fam Hx     Review of Systems:  No other skin or systemic complaints except as noted in HPI or Assessment and Plan.  Objective  Well appearing patient in no apparent distress; mood and affect are within normal limits.  A focused examination was performed including face. Relevant physical exam findings are noted in the Assessment and Plan.  Objective  Head - Anterior (Face) (10): Erythematous thin papules/macules with gritty scale.   Objective  L medial calf x 2, L thigh x 1 (3): Erythematous keratotic or waxy stuck-on papule or plaque.   Objective  Right bicep: Firm pink patch    Assessment & Plan    Actinic Damage - diffuse scaly erythematous macules with underlying dyspigmentation - Recommend daily broad spectrum sunscreen SPF 30+ to sun-exposed areas, reapply every 2 hours as needed.  - Call for new or changing lesions.  AK (actinic keratosis) (10) Head - Anterior (Face)  Destruction of lesion - Head - Anterior (Face) Complexity: simple   Destruction method: cryotherapy   Informed consent: discussed and consent obtained   Timeout:  patient name, date of birth, surgical site, and procedure verified Lesion destroyed using liquid nitrogen: Yes   Region frozen until ice ball extended beyond lesion: Yes   Outcome: patient tolerated procedure well with no complications   Post-procedure details: wound care instructions given    Inflamed seborrheic keratosis (3) L medial calf x 2, L thigh x 1  Destruction of lesion - L medial calf x 2, L thigh x 1 Complexity: simple     Destruction method: cryotherapy   Informed consent: discussed and consent obtained   Timeout:  patient name, date of birth, surgical site, and procedure verified Lesion destroyed using liquid nitrogen: Yes   Region frozen until ice ball extended beyond lesion: Yes   Outcome: patient tolerated procedure well with no complications   Post-procedure details: wound care instructions given    Prurigo nodularis Right bicep  Bite reaction with LSC/Prurigo nodularis   Destruction of lesion - Right bicep Complexity: simple   Destruction method: cryotherapy   Informed consent: discussed and consent obtained   Timeout:  patient name, date of birth, surgical site, and procedure verified Lesion destroyed using liquid nitrogen: Yes   Region frozen until ice ball extended beyond lesion: Yes   Outcome: patient tolerated procedure well with no complications   Post-procedure details: wound care instructions given    Seborrheic Keratoses - Stuck-on, waxy, tan-brown papules and plaques  - Discussed benign etiology and prognosis. - Observe - Call for any change  Return in about 2 months (around 08/16/2020) for Aks .  IMarye Round, CMA, am acting as scribe for Sarina Ser, MD .  Documentation: I have reviewed the above documentation for accuracy and completeness, and I agree with the above.  Sarina Ser, MD

## 2020-06-20 ENCOUNTER — Encounter: Payer: Self-pay | Admitting: Dermatology

## 2020-07-01 ENCOUNTER — Ambulatory Visit: Payer: Medicare Other | Attending: Internal Medicine

## 2020-07-01 DIAGNOSIS — Z23 Encounter for immunization: Secondary | ICD-10-CM

## 2020-07-01 NOTE — Progress Notes (Signed)
   Covid-19 Vaccination Clinic  Name:  Michael Leon    MRN: [id number] DOB: 11-04-1950  07/01/2020  Michael Leon was observed post Covid-19 immunization for 15 minutes without incident. He was provided with Vaccine Information Sheet and instruction to access the V-Safe system.   Michael Leon was instructed to call 911 with any severe reactions post vaccine: Marland Kitchen Difficulty breathing  . Swelling of face and throat  . A fast heartbeat  . A bad rash all over body  . Dizziness and weakness

## 2020-07-30 ENCOUNTER — Encounter: Payer: Self-pay | Admitting: Family Medicine

## 2020-07-30 ENCOUNTER — Other Ambulatory Visit: Payer: Self-pay

## 2020-07-30 ENCOUNTER — Ambulatory Visit (INDEPENDENT_AMBULATORY_CARE_PROVIDER_SITE_OTHER): Payer: Medicare Other | Admitting: Family Medicine

## 2020-07-30 VITALS — BP 130/69 | HR 65 | Temp 98.3°F | Resp 16 | Ht 64.0 in | Wt 210.0 lb

## 2020-07-30 DIAGNOSIS — Z23 Encounter for immunization: Secondary | ICD-10-CM

## 2020-07-30 DIAGNOSIS — Z6836 Body mass index (BMI) 36.0-36.9, adult: Secondary | ICD-10-CM

## 2020-07-30 DIAGNOSIS — I10 Essential (primary) hypertension: Secondary | ICD-10-CM | POA: Diagnosis not present

## 2020-07-30 DIAGNOSIS — R7303 Prediabetes: Secondary | ICD-10-CM

## 2020-07-30 DIAGNOSIS — I2510 Atherosclerotic heart disease of native coronary artery without angina pectoris: Secondary | ICD-10-CM

## 2020-07-30 DIAGNOSIS — E782 Mixed hyperlipidemia: Secondary | ICD-10-CM

## 2020-07-30 MED ORDER — PHENTERMINE HCL 30 MG PO CAPS: 30.0000 mg | ORAL_CAPSULE | ORAL | 2 refills | Status: DC

## 2020-07-30 NOTE — Patient Instructions (Signed)
Try Topical Zinc on Lesion with Band-Aid.

## 2020-07-30 NOTE — Progress Notes (Signed)
I,Tiffani Kadow,acting as a scribe for Wilhemena Durie, MD.,have documented all relevant documentation on the behalf of Wilhemena Durie, MD,as directed by  Wilhemena Durie, MD while in the presence of Wilhemena Durie, MD.  Established patient visit   Patient: Michael Leon   DOB: 01/01/1951   69 y.o. Male  MRN: [id number] Visit Date: 07/30/2020  Today's healthcare provider: Wilhemena Durie, MD   Chief Complaint  Patient presents with  . Follow-up  . Hypertension  . Prediabetes   Subjective    HPI  Overall patient is doing well.  He would like some help in trying to lose weight.  He has a lesion on his forearm which she keeps on picking it and therefore would not heal. Hypertension, follow-up  BP Readings from Last 3 Encounters:  07/30/20 130/69  01/29/20 127/79  12/31/19 116/65   Wt Readings from Last 3 Encounters:  07/30/20 210 lb (95.3 kg)  01/29/20 199 lb 12.8 oz (90.6 kg)  12/31/19 195 lb (88.5 kg)     He was last seen for hypertension 6 months ago.  BP at that visit was 127/79. Management since that visit includes; Controlled on losartan and metoprolol. He reports good compliance with treatment. He is not having side effects. none He is not exercising. He is adherent to low salt diet.   Outside blood pressures are not checking.  He does not smoke.  Use of agents associated with hypertension: none.   --------------------------------------------------------------------  Borderline diabetes From 01/29/2020-Follow-up A1c.  Continue to work on weight. HgB A1c 6.2.      Medications: Outpatient Medications Prior to Visit  Medication Sig  . aspirin 81 MG tablet Take 81 mg by mouth daily.  Marland Kitchen atorvastatin (LIPITOR) 80 MG tablet Take by mouth.  . celecoxib (CELEBREX) 200 MG capsule Take 1 capsule (200 mg total) by mouth daily.  Marland Kitchen losartan (COZAAR) 50 MG tablet Take 1 tablet (50 mg total) by mouth daily.  . pantoprazole (PROTONIX) 40 MG  tablet TAKE 1 TABLET(40 MG) BY MOUTH TWICE DAILY  . PARoxetine (PAXIL) 40 MG tablet Take 1 tablet (40 mg total) by mouth daily.  . metoprolol tartrate (LOPRESSOR) 50 MG tablet Take 1 tablet (50 mg total) by mouth daily. (Patient not taking: Reported on 09/06/2019)  . NITROSTAT 0.4 MG SL tablet Reported on 02/04/2016 (Patient not taking: Reported on 07/30/2020)   No facility-administered medications prior to visit.    Review of Systems  Constitutional: Negative for appetite change, chills and fever.  Respiratory: Negative for chest tightness, shortness of breath and wheezing.   Cardiovascular: Negative for chest pain and palpitations.  Gastrointestinal: Negative for abdominal pain, nausea and vomiting.       Objective    BP 130/69 (BP Location: Right Arm, Patient Position: Sitting, Cuff Size: Large)   Pulse 65   Temp 98.3 F (36.8 C) (Oral)   Resp 16   Ht 5\' 4"  (1.626 m)   Wt 210 lb (95.3 kg)   SpO2 97%   BMI 36.05 kg/m  BP Readings from Last 3 Encounters:  07/30/20 130/69  01/29/20 127/79  12/31/19 116/65   Wt Readings from Last 3 Encounters:  07/30/20 210 lb (95.3 kg)  01/29/20 199 lb 12.8 oz (90.6 kg)  12/31/19 195 lb (88.5 kg)      Physical Exam Vitals reviewed.  Constitutional:      Appearance: Normal appearance. He is well-developed.  HENT:     Head: Normocephalic and  atraumatic.     Right Ear: Tympanic membrane and external ear normal.     Left Ear: Tympanic membrane and external ear normal.     Nose: Nose normal.     Mouth/Throat:     Mouth: Mucous membranes are moist.     Pharynx: Oropharynx is clear.  Eyes:     General: No scleral icterus.    Conjunctiva/sclera: Conjunctivae normal.  Cardiovascular:     Rate and Rhythm: Normal rate and regular rhythm.     Heart sounds: Normal heart sounds.  Pulmonary:     Effort: Pulmonary effort is normal.     Breath sounds: Normal breath sounds.  Abdominal:     Palpations: Abdomen is soft.  Lymphadenopathy:      Cervical: No cervical adenopathy.  Skin:    General: Skin is warm and dry.     Comments: He has a small scab on his right forearm which keep continues to pick at.  Neurological:     Mental Status: He is alert and oriented to person, place, and time.  Psychiatric:        Mood and Affect: Mood normal.        Behavior: Behavior normal.        Thought Content: Thought content normal.        Judgment: Judgment normal.       No results found for any visits on 07/30/20.  Assessment & Plan     1. Essential hypertension Good control with losartan  2. Borderline diabetes A1c is 6.2. - POCT glycosylated hemoglobin (Hb A1C)  3. Class 2 severe obesity due to excess calories with serious comorbidity and body mass index (BMI) of 36.0 to 36.9 in adult (HCC) Weight loss will try phentermine 30 mg daily through the end of the year.  4. Need for influenza vaccination  - Flu Vaccine QUAD High Dose(Fluad)  5. Mixed hyperlipidemia On atorvastatin  6. Arteriosclerosis of coronary artery Risk factors treated.   No follow-ups on file.         Richard Cranford Mon, MD  [facility] [phone] (phone) [phone] (fax)  [place]

## 2020-08-07 LAB — POCT GLYCOSYLATED HEMOGLOBIN (HGB A1C): Hemoglobin A1C: 6.2 % — AB (ref 4.0–5.6)

## 2020-08-11 ENCOUNTER — Encounter: Payer: Self-pay | Admitting: Dermatology

## 2020-08-11 ENCOUNTER — Other Ambulatory Visit: Payer: Self-pay

## 2020-08-11 ENCOUNTER — Ambulatory Visit (INDEPENDENT_AMBULATORY_CARE_PROVIDER_SITE_OTHER): Payer: Medicare Other | Admitting: Dermatology

## 2020-08-11 DIAGNOSIS — L814 Other melanin hyperpigmentation: Secondary | ICD-10-CM

## 2020-08-11 DIAGNOSIS — L578 Other skin changes due to chronic exposure to nonionizing radiation: Secondary | ICD-10-CM

## 2020-08-11 DIAGNOSIS — Z1283 Encounter for screening for malignant neoplasm of skin: Secondary | ICD-10-CM | POA: Diagnosis not present

## 2020-08-11 DIAGNOSIS — D1800 Hemangioma unspecified site: Secondary | ICD-10-CM | POA: Diagnosis not present

## 2020-08-11 DIAGNOSIS — L281 Prurigo nodularis: Secondary | ICD-10-CM | POA: Diagnosis not present

## 2020-08-11 DIAGNOSIS — L570 Actinic keratosis: Secondary | ICD-10-CM | POA: Diagnosis not present

## 2020-08-11 DIAGNOSIS — L918 Other hypertrophic disorders of the skin: Secondary | ICD-10-CM

## 2020-08-11 DIAGNOSIS — L821 Other seborrheic keratosis: Secondary | ICD-10-CM

## 2020-08-11 DIAGNOSIS — D229 Melanocytic nevi, unspecified: Secondary | ICD-10-CM | POA: Diagnosis not present

## 2020-08-11 DIAGNOSIS — I2510 Atherosclerotic heart disease of native coronary artery without angina pectoris: Secondary | ICD-10-CM

## 2020-08-11 MED ORDER — TRIAMCINOLONE ACETONIDE 0.1 % EX CREA: 1.0000 "application " | TOPICAL_CREAM | Freq: Every day | CUTANEOUS | 0 refills | Status: DC | PRN

## 2020-08-11 NOTE — Progress Notes (Signed)
Follow-Up Visit   Subjective  Michael Leon is a 69 y.o. male who presents for the following: Actinic Keratosis (follow up - face treated with LN2 x 10). 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 He has several areas to be evaluated. The patient presents for Upper Body Skin Exam (UBSE) for skin cancer screening and mole check.  The following portions of the chart were reviewed this encounter and updated as appropriate:  Tobacco  Allergies  Meds  Problems  Med Hx  Surg Hx  Fam Hx     Review of Systems:  No other skin or systemic complaints except as noted in HPI or Assessment and Plan.  Objective  Well appearing patient in no apparent distress; mood and affect are within normal limits.  All skin waist up examined.  Objective  Right Forearm, right hand: Hyperkeratotic papules.  Objective  Left Axilla x 3, right ant waistline x 1 (4): Fleshy, skin-colored pedunculated papules.    Objective  Left cheek: Erythematous thin papules/macules with gritty scale.    Assessment & Plan    Lentigines - Scattered tan macules - Discussed due to sun exposure - Benign, observe - Call for any changes  Seborrheic Keratoses - Stuck-on, waxy, tan-brown papules and plaques  - Discussed benign etiology and prognosis. - Observe - Call for any changes  Melanocytic Nevi - Tan-brown and/or pink-flesh-colored symmetric macules and papules - Benign appearing on exam today - Observation - Call clinic for new or changing moles - Recommend daily use of broad spectrum spf 30+ sunscreen to sun-exposed areas.   Hemangiomas - Red papules - Discussed benign nature - Observe - Call for any changes  Actinic Damage - diffuse scaly erythematous macules with underlying dyspigmentation - Recommend daily broad spectrum sunscreen SPF 30+ to sun-exposed areas, reapply every 2 hours as needed.  - Call for new or changing lesions.  Skin cancer screening performed  today.  Prurigo nodularis Right Forearm, right hand  Destruction of lesion - Right Forearm, right hand Complexity: simple   Destruction method: cryotherapy   Informed consent: discussed and consent obtained   Timeout:  patient name, date of birth, surgical site, and procedure verified Lesion destroyed using liquid nitrogen: Yes   Region frozen until ice ball extended beyond lesion: Yes   Outcome: patient tolerated procedure well with no complications   Post-procedure details: wound care instructions given    Intralesional injection - Right Forearm, right hand Location: right forearm, right hand  Informed Consent: Discussed risks (infection, pain, bleeding, bruising, thinning of the skin, loss of skin pigment, lack of resolution, and recurrence of lesion) and benefits of the procedure, as well as the alternatives. Informed consent was obtained. Preparation: The area was prepared a standard fashion.  Anesthesia: none  Procedure Details: An intralesional injection was performed with Kenalog 5 mg/cc.  0.3 cc in total were injected.  Total number of injections: 4  Plan: The patient was instructed on post-op care. Recommend OTC analgesia as needed for pain.   triamcinolone cream (KENALOG) 0.1 % - Right Forearm, right hand  Skin tag (4) Left Axilla x 3, right ant waistline x 1  Epidermal / dermal shaving - Left Axilla x 3, right ant waistline x 1  Informed consent: discussed and consent obtained   Patient was prepped and draped in usual sterile fashion: Area prepped with alcohol. Anesthesia: the lesion was anesthetized in a standard fashion   Anesthetic:  1% lidocaine w/ epinephrine 1-100,000 buffered w/  8.4% NaHCO3 Instrument used: scissors   Hemostasis achieved with: pressure, aluminum chloride and electrodesiccation   Outcome: patient tolerated procedure well   Post-procedure details: wound care instructions given    AK (actinic keratosis) Left cheek  Destruction of  lesion - Left cheek Complexity: simple   Destruction method: cryotherapy   Informed consent: discussed and consent obtained   Timeout:  patient name, date of birth, surgical site, and procedure verified Lesion destroyed using liquid nitrogen: Yes   Region frozen until ice ball extended beyond lesion: Yes   Outcome: patient tolerated procedure well with no complications   Post-procedure details: wound care instructions given    Return in about 3 months (around 11/11/2020) for Prurigo follow up.  I, Ashok Cordia, CMA, am acting as scribe for Sarina Ser, MD .  Documentation: I have reviewed the above documentation for accuracy and completeness, and I agree with the above.  Sarina Ser, MD

## 2020-08-11 NOTE — Patient Instructions (Signed)

## 2020-08-12 ENCOUNTER — Encounter: Payer: Self-pay | Admitting: Dermatology

## 2020-08-12 DIAGNOSIS — M25511 Pain in right shoulder: Secondary | ICD-10-CM | POA: Diagnosis not present

## 2020-08-13 ENCOUNTER — Other Ambulatory Visit: Payer: Self-pay | Admitting: Family Medicine

## 2020-08-13 DIAGNOSIS — I10 Essential (primary) hypertension: Secondary | ICD-10-CM | POA: Diagnosis not present

## 2020-08-13 DIAGNOSIS — E782 Mixed hyperlipidemia: Secondary | ICD-10-CM | POA: Diagnosis not present

## 2020-08-13 DIAGNOSIS — Z86010 Personal history of colonic polyps: Secondary | ICD-10-CM | POA: Diagnosis not present

## 2020-08-13 DIAGNOSIS — I2510 Atherosclerotic heart disease of native coronary artery without angina pectoris: Secondary | ICD-10-CM | POA: Diagnosis not present

## 2020-08-13 DIAGNOSIS — F3342 Major depressive disorder, recurrent, in full remission: Secondary | ICD-10-CM

## 2020-09-05 DIAGNOSIS — M25511 Pain in right shoulder: Secondary | ICD-10-CM | POA: Diagnosis not present

## 2020-09-18 DIAGNOSIS — S46011A Strain of muscle(s) and tendon(s) of the rotator cuff of right shoulder, initial encounter: Secondary | ICD-10-CM | POA: Diagnosis not present

## 2020-09-18 DIAGNOSIS — M7551 Bursitis of right shoulder: Secondary | ICD-10-CM | POA: Diagnosis not present

## 2020-09-18 DIAGNOSIS — S46211A Strain of muscle, fascia and tendon of other parts of biceps, right arm, initial encounter: Secondary | ICD-10-CM | POA: Diagnosis not present

## 2020-09-18 DIAGNOSIS — M19011 Primary osteoarthritis, right shoulder: Secondary | ICD-10-CM | POA: Diagnosis not present

## 2020-09-18 DIAGNOSIS — M25511 Pain in right shoulder: Secondary | ICD-10-CM | POA: Diagnosis not present

## 2020-10-06 ENCOUNTER — Telehealth: Payer: Self-pay

## 2020-10-06 DIAGNOSIS — M75121 Complete rotator cuff tear or rupture of right shoulder, not specified as traumatic: Secondary | ICD-10-CM | POA: Diagnosis not present

## 2020-10-06 NOTE — Telephone Encounter (Signed)
Copied from [place] [id number]. Topic: General - Other >> Oct 06, 2020  3:15 PM Celene Kras wrote: Reason for CRM: Pt called stating that he is supposed to have rotator cuff surgery on 10/16/20. He states that [facility] is needing to have a medical clearance form faxed over to them for pt. Please advise.   Callback # Tron[place] Megan

## 2020-10-07 NOTE — Telephone Encounter (Signed)
Have you seen medical clearance form? It looks like patient saw cardiology yesterday for clearance also. Please advise. Thanks!

## 2020-10-08 NOTE — Telephone Encounter (Signed)
Form is in provider basket.

## 2020-10-08 NOTE — Telephone Encounter (Signed)
I do not call seeing this surgical clearance

## 2020-10-09 NOTE — Telephone Encounter (Signed)
I will sign

## 2020-10-13 ENCOUNTER — Other Ambulatory Visit: Payer: Self-pay | Admitting: Orthopedic Surgery

## 2020-10-14 ENCOUNTER — Other Ambulatory Visit: Payer: Self-pay

## 2020-10-14 ENCOUNTER — Other Ambulatory Visit
Admission: RE | Admit: 2020-10-14 | Discharge: 2020-10-14 | Disposition: A | Discharge status: Home / Self Care | Payer: Medicare Other | Source: Ambulatory Visit | Attending: Orthopedic Surgery | Admitting: Orthopedic Surgery

## 2020-10-14 ENCOUNTER — Encounter: Payer: Self-pay | Admitting: Urgent Care

## 2020-10-14 ENCOUNTER — Encounter: Payer: Self-pay | Admitting: Orthopedic Surgery

## 2020-10-14 ENCOUNTER — Encounter
Admission: RE | Admit: 2020-10-14 | Discharge: 2020-10-14 | Disposition: A | Discharge status: Home / Self Care | Payer: Medicare Other | Source: Ambulatory Visit | Attending: Orthopedic Surgery | Admitting: Orthopedic Surgery

## 2020-10-14 DIAGNOSIS — Z20822 Contact with and (suspected) exposure to covid-19: Secondary | ICD-10-CM | POA: Insufficient documentation

## 2020-10-14 DIAGNOSIS — Z01812 Encounter for preprocedural laboratory examination: Secondary | ICD-10-CM | POA: Insufficient documentation

## 2020-10-14 HISTORY — DX: Unspecified osteoarthritis, unspecified site: M19.90

## 2020-10-14 HISTORY — DX: Sleep apnea, unspecified: G47.30

## 2020-10-14 HISTORY — DX: Atherosclerotic heart disease of native coronary artery without angina pectoris: I25.10

## 2020-10-14 LAB — APTT: aPTT: 27 seconds (ref 24–36)

## 2020-10-14 LAB — CBC WITH DIFFERENTIAL/PLATELET
Abs Immature Granulocytes: 0.02 10*3/uL (ref 0.00–0.07)
Basophils Absolute: 0 10*3/uL (ref 0.0–0.1)
Basophils Relative: 0 %
Eosinophils Absolute: 0.3 10*3/uL (ref 0.0–0.5)
Eosinophils Relative: 5 %
HCT: 47 % (ref 39.0–52.0)
Hemoglobin: 16.6 g/dL (ref 13.0–17.0)
Immature Granulocytes: 0 %
Lymphocytes Relative: 31 %
Lymphs Abs: 2.1 10*3/uL (ref 0.7–4.0)
MCH: 32.7 pg (ref 26.0–34.0)
MCHC: 35.3 g/dL (ref 30.0–36.0)
MCV: 92.5 fL (ref 80.0–100.0)
Monocytes Absolute: 0.7 10*3/uL (ref 0.1–1.0)
Monocytes Relative: 10 %
Neutro Abs: 3.7 10*3/uL (ref 1.7–7.7)
Neutrophils Relative %: 54 %
Platelets: 227 10*3/uL (ref 150–400)
RBC: 5.08 MIL/uL (ref 4.22–5.81)
RDW: 13.1 % (ref 11.5–15.5)
WBC: 6.9 10*3/uL (ref 4.0–10.5)
nRBC: 0 % (ref 0.0–0.2)

## 2020-10-14 LAB — BASIC METABOLIC PANEL
Anion gap: 10 (ref 5–15)
BUN: 13 mg/dL (ref 8–23)
CO2: 24 mmol/L (ref 22–32)
Calcium: 9.1 mg/dL (ref 8.9–10.3)
Chloride: 103 mmol/L (ref 98–111)
Creatinine, Ser: 0.98 mg/dL (ref 0.61–1.24)
GFR, Estimated: >60 mL/min (ref >60)
Glucose, Bld: 133 mg/dL — ABNORMAL HIGH (ref 70–99)
Potassium: 4.1 mmol/L (ref 3.5–5.1)
Sodium: 137 mmol/L (ref 135–145)

## 2020-10-14 LAB — PROTIME-INR
INR: 0.9 (ref 0.8–1.2)
Prothrombin Time: 12 seconds (ref 11.4–15.2)

## 2020-10-14 NOTE — Patient Instructions (Signed)
INSTRUCTIONS FOR SURGERY     Your surgery is scheduled for:   Thursday, December 30TH     To find out your arrival time for the day of surgery,          please call [phone] between 1 pm and 3 pm on : Wednesday, December 29TH     When you arrive for surgery, report to the [facility]. Registration will then send you to the      2nd floor of the [facility] to sign in at surgery desk.    REMEMBER: Instructions that are not followed completely may result in serious medical risk,  up to and including death, or upon the discretion of your surgeon and anesthesiologist,            your surgery may need to be rescheduled.  __X__ 1. Do not eat food after midnight the night before your procedure.                    No gum, candy, lozenger, tic tacs, tums or hard candies.                  ABSOLUTELY NOTHING SOLID IN YOUR MOUTH AFTER MIDNIGHT                    You may drink unlimited clear liquids up to 2 hours before you are scheduled to arrive for surgery.                   Do not drink anything within those 2 hours unless you need to take medicine, then take the                   smallest amount you need.  Clear liquids include:  water, apple juice without pulp,                   any flavor Gatorade, Black coffee, black tea.  Sugar may be added but no dairy/ honey /lemon.                        Broth and jello is not considered a clear liquid.  __x__  2. On the morning of surgery, please brush your teeth with toothpaste and water. You may rinse with                  mouthwash if you wish but DO NOT SWALLOW TOOTHPASTE OR MOUTHWASH  __X___3. NO alcohol for 24 hours before or after surgery.  __x___ 4.  Do NOT smoke or use e-cigarettes for 24 HOURS PRIOR TO SURGERY.                      DO NOT Use any chewable tobacco products for at least 6 hours prior to surgery.  __x___ 5. If you start any new medication after this appointment and prior to  surgery, please                   Bring it with you on the day of surgery.  ___x__ 6. Notify your doctor if there is  any change in your medical condition, such as fever,                  infection, vomitting, diarrhea or any open sores.  __x___ 7.  USE the CHG SOAP as instructed, the night before surgery and the day of surgery.                   Once you have washed with this soap, do NOT use any of the following: Powders, perfumes                    or lotions. Please do not wear make up, hairpins, clips or nail polish. You MAY NOT wear deodorant.                   Men may shave their face and neck.  Women need to shave 48 hours prior to surgery.                   DO NOT wear ANY jewelry on the day of surgery. If there are rings that are too tight to                    remove easily, please address this prior to the surgery day. Piercings need to be removed.                                                                     NO METAL ON YOUR BODY.                    Do NOT bring any valuables.  If you came to Pre-Admit testing then you will not need license,                     insurance card or credit card.  If you will be staying overnight, please either leave your things in                     the car or have your family be responsible for these items.                     [facility] IS NOT RESPONSIBLE FOR BELONGINGS OR VALUABLES.  ___X__ 8. DO NOT wear contact lenses on surgery day.  You may not have dentures,                     Hearing aides, contacts or glasses in the operating room. These items can be                    Placed in the Recovery Room to receive immediately after surgery.  __x___ 9. IF YOU ARE SCHEDULED TO GO HOME ON THE SAME DAY, YOU MUST                   Have someone to drive you home and to stay with you  for the first 24 hours.                    Have an arrangement prior to arriving on surgery day.  ___x__ 10. Take the  following medications on the morning of  surgery with a sip of water:                              1.  PROTONIX                     2.  PAXIL                      3.                  _____ 11.  Follow any instructions provided to you by your surgeon.                        Such as enema, clear liquid bowel prep  __X__  12. STOP ALL ASPIRIN PRODUCTS IMMEDIATELY                         THIS INCLUDES BC POWDERS / GOODIES POWDER  __x___ 13. STOP Anti-inflammatories as IMMEDIATELY                      This includes IBUPROFEN / MOTRIN / ADVIL / ALEVE/ NAPROXYN                    YOU MAY TAKE TYLENOL ANY TIME PRIOR TO SURGERY.  __X___ 14.  Stop supplements until after surgery.                    NO OVER THE COUNTER MEDICATIONS UNTIL AFTER SURGERY.                   __X____17.  Continue to take the following medications but do not take on the morning of surgery:                        LOSARTAN AND ASPIRIN  ___X___18.  Wear clean and comfortable clothing to the hospital.                      You will want either a loose t shirt or a button down/up shirt.                      Wear what will be easy to get into after surgery.  Have stool softeners to use once home.

## 2020-10-14 NOTE — Progress Notes (Signed)
[facility] Perioperative Services  Pre-Admission/Anesthesia Testing Clinical Review  Date: 10/14/20  Patient Demographics:  Name: Michael Leon DOB:   [birth year] MRN:   [id number]  Planned Surgical Procedure(s):    Case: [id number] Date/Time: 10/16/20 0730   Procedure: RIGHT SHOULDER ARTHROSCOPY WITH MINI-OPEN ROTATOR CUFF REPAIR, DISTAL CLAVICLE EXCISION AND SUBACROMIAL DECOMPRESSION (Right )   Anesthesia type: General   Pre-op diagnosis: Right Rotator Cuff Tear   Location: [facility] OR ROOM 02 / [facility]S FOR ANEST[facility]ESIA [facility]   Surgeons: Juanell Fairly, MD    NOTE: Available PAT nursing documentation and vital signs have been reviewed. Clinical nursing staff has updated patient's PMH/PSHx, current medication list, and drug allergies/intolerances to ensure comprehensive history available to assist in medical decision making as it pertains to the aforementioned surgical procedure and anticipated anesthetic course.   Clinical Discussion:  Michael Leon is a 69 y.o. male who is submitted for pre-surgical anesthesia review and clearance prior to him undergoing the above procedure. Patient is a Former Smoker (22.5 pack years; quit 10/2002). Pertinent PMH includes: angina, CAD, STEMI (2016), ASCVD, DM, HLD, prediabetes, OSAH (does not use nocturnal PAP therapy), GERD (on daily PPI), OA, depression, anxiety  Patient is followed by cardiology Gwen Pounds, MD). He was last seen in the cardiology clinic on 08/13/2020; notes reviewed.  At the time of his clinic visit, patient denied any episodes of chest pain.  He complained of acute on chronic shortness of breath.  No PND, orthopnea, palpitations, significant peripheral edema, vertiginous symptoms, or presyncope/syncope.  Patient with significant cardiac history.  Patient presented to the ED on 04/29/2015 with ST elevation and the inferolateral leads.  He was taken for diagnostic catheterization and found to have a 99% right PDA  stenosis, which was subsequently stented with a DES x 1.  Prior stent in the RCA noted. Patient is on a statin for his HLD.  Last TTE performed on 11/16/2019 revealed normal left-ventricular systolic function with mild valvular insufficiency; LVEF 55%.  Myocardial perfusion imaging study revealed no evidence of stress-induced ischemia or arrhythmia (see full interpretation of cardiovascular testing and interventions below). Functional capacity, as defined by DASI, is documented as being >/= 4 METS.  No changes were made to patient's medication regimen.  Patient follow-up with outpatient cardiology in 6 months for ongoing evaluation and management of his cardiovascular disease.  Patient scheduled to undergo an elective orthopedic procedure on 10/16/2020 with Dr. Juanell Fairly.  Given patient's past medical history, presurgical internal medicine and cardiac clearance was involved by the performing surgeon and PAT team.  Clearances from the aforementioned providers obtained is follows:   Per internal/family medicine, "patient is optimized for surgery and may proceed with planned orthopedic and overall LOW risk for complications".   Per cardiology, "this patient is optimized for surgery and may proceed with the planned procedural course with a LOW risk stratification".  This patient is currently on daily antiplatelet therapy.  He has been instructed on recommendations cardiology to hold his daily low-dose ASA for 5 days prior to his procedure.  Last dose of ASA will be on 10/10/2020.  He denies previous perioperative complications with anesthesia. He underwent a general anesthetic course at [facility] ([facility]) in 12/2019 with no documented complications.   Vitals with BMI 10/14/2020 07/30/2020 01/29/2020  Height 5\' 5"  5\' 4"  5\' 4"   Weight 210 lbs 210 lbs 199 lbs 13 oz  BMI 34.95 36.03 34.28  Systolic - 130 127  Diastolic - 69  79  Pulse - 65 70    Providers/Specialists:   NOTE: Primary  physician provider listed below. Patient may have been seen by APP or partner within same practice.   PROVIDER ROLE / SPECIALTY LAST Larey Seat, MD Orthopedics (Surgeon)  10/06/2020  Jerrol Banana., MD Primary Care Provider  01/29/2020  Serafina Royals, MD Cardiology  08/13/2020   Allergies:  Codeine  Current Home Medications:   No current facility-administered medications for this encounter.   Marland Kitchen aspirin EC 81 MG tablet  . atorvastatin (LIPITOR) 80 MG tablet  . losartan (COZAAR) 50 MG tablet  . pantoprazole (PROTONIX) 40 MG tablet  . PARoxetine (PAXIL) 40 MG tablet  . phentermine 30 MG capsule  . triamcinolone cream (KENALOG) 0.1 %   History:   Past Medical History:  Diagnosis Date  . Arthritis   . ASCVD (arteriosclerotic cardiovascular disease)   . Cervical radiculopathy   . Chest pain, unspecified   . Coronary artery disease   . Depression   . Dysplastic nevus    left ear - treated by Dr. Sharlett Iles  . GAD (generalized anxiety disorder)   . GERD (gastroesophageal reflux disease)   . Gout   . Heart attack ([place]) 2016   2 STENTS  . HLD (hyperlipidemia)   . Hypertension   . Kidney stone   . Obesity   . Pre-diabetes    borderline. patient unaware of this,  . Reflux   . Sleep apnea    CAN NOT USE HIS CPAP  . Vertigo    none recently   Past Surgical History:  Procedure Laterality Date  . ANTERIOR CRUCIATE LIGAMENT REPAIR Right 1993, 2005   1993  . APPENDECTOMY    . CARDIAC CATHETERIZATION  2016   stent  . CHOLECYSTECTOMY    . COLONOSCOPY WITH PROPOFOL N/A 12/31/2019   Procedure: COLONOSCOPY WITH PROPOFOL;  Surgeon: [name], MD;  Location: [place];  Service: Endoscopy;  Laterality: N/A;  . CORONARY STENT PLACEMENT    . EYE SURGERY Left   . TONSILLECTOMY    . UPPER GI ENDOSCOPY     Family History  Problem Relation Age of Onset  . Cancer Mother        lung  . Stroke Mother   . Multiple sclerosis Mother   . Heart attack  Father   . Bladder Cancer Maternal Grandfather   . Heart disease Maternal Grandfather   . Prostate cancer Neg Hx   . Kidney disease Neg Hx    Social History   Tobacco Use  . Smoking status: Former Smoker    Packs/day: 1.50    Years: 15.00    Pack years: 22.50    Types: Cigarettes    Quit date: 10/18/2002    Years since quitting: 18.0  . Smokeless tobacco: Current User    Types: Chew  . Tobacco comment: Dips  Vaping Use  . Vaping Use: Never used  Substance Use Topics  . Alcohol use: Yes    Alcohol/week: 7.0 - 14.0 standard drinks    Types: 7 - 14 Cans of beer per week    Comment: 1-2 "lite" beers per day  . Drug use: No    Pertinent Clinical Results:  LABS: Labs reviewed: Acceptable for surgery.  Hospital Outpatient Visit on 10/14/2020  Component Date Value Ref Range Status  . Sodium 10/14/2020 137  135 - 145 mmol/L Final  . Potassium 10/14/2020 4.1  3.5 - 5.1 mmol/L Final  . Chloride  10/14/2020 103  98 - 111 mmol/L Final  . CO2 10/14/2020 24  22 - 32 mmol/L Final  . Glucose, Bld 10/14/2020 133* 70 - 99 mg/dL Final   Glucose reference range applies only to samples taken after fasting for at least 8 hours.  . BUN 10/14/2020 13  8 - 23 mg/dL Final  . Creatinine, Ser 10/14/2020 0.98  0.61 - 1.24 mg/dL Final  . Calcium 10/14/2020 9.1  8.9 - 10.3 mg/dL Final  . GFR, Estimated 10/14/2020 >60  >60 mL/min Final   Comment: (NOTE) Calculated using the CKD-EPI Creatinine Equation (2021)   . Anion gap 10/14/2020 10  5 - 15 Final   Performed at [facility], [address] [place], [place] [address]  . WBC 10/14/2020 6.9  4.0 - 10.5 K/uL Final  . RBC 10/14/2020 5.08  4.22 - 5.81 MIL/uL Final  . Hemoglobin 10/14/2020 16.6  13.0 - 17.0 g/dL Final  . HCT 10/14/2020 47.0  39.0 - 52.0 % Final  . MCV 10/14/2020 92.5  80.0 - 100.0 fL Final  . MCH 10/14/2020 32.7  26.0 - 34.0 pg Final  . MCHC 10/14/2020 35.3  30.0 - 36.0 g/dL Final  . RDW 10/14/2020 13.1  11.5 - 15.5 %  Final  . Platelets 10/14/2020 227  150 - 400 K/uL Final  . nRBC 10/14/2020 0.0  0.0 - 0.2 % Final  . Neutrophils Relative % 10/14/2020 54  % Final  . Neutro Abs 10/14/2020 3.7  1.7 - 7.7 K/uL Final  . Lymphocytes Relative 10/14/2020 31  % Final  . Lymphs Abs 10/14/2020 2.1  0.7 - 4.0 K/uL Final  . Monocytes Relative 10/14/2020 10  % Final  . Monocytes Absolute 10/14/2020 0.7  0.1 - 1.0 K/uL Final  . Eosinophils Relative 10/14/2020 5  % Final  . Eosinophils Absolute 10/14/2020 0.3  0.0 - 0.5 K/uL Final  . Basophils Relative 10/14/2020 0  % Final  . Basophils Absolute 10/14/2020 0.0  0.0 - 0.1 K/uL Final  . Immature Granulocytes 10/14/2020 0  % Final  . Abs Immature Granulocytes 10/14/2020 0.02  0.00 - 0.07 K/uL Final   Performed at [facility], [address] [place], [place] [address]  . Prothrombin Time 10/14/2020 12.0  11.4 - 15.2 seconds Final  . INR 10/14/2020 0.9  0.8 - 1.2 Final   Comment: (NOTE) INR goal varies based on device and disease states. Performed at [facility], [address] [place], [place] [address]   . aPTT 10/14/2020 27  24 - 36 seconds Final   Performed at [facility], [address] [place], [place] [address]    ECG: Date: 08/13/2020 Rate: 66 bpm Rhythm: normal sinus Axis (leads I and aVF): Normal Intervals: PR 180 ms. QRS 68 ms. QTc 394 ms. ST segment and T wave changes: No evidence of acute ST segment elevation or depression Comparison: Similar to previous tracing obtained on 08/27/2019 NOTE: Tracing obtained at [facility]; unable for review. Above based on cardiologist's interpretation.    IMAGING / PROCEDURES: LEXISCAN performed on 11/16/2019 1. LVEF 60% 2. Left ventricular cavity size normal 3. Regional wall motion reveals normal myocardial thickening and wall motion 4. No evidence of stress-induced myocardial ischemia or arrhythmia 5. No artifacts noted 6. The overall quality of the food is  good  ECHOCARDIOGRAM performed on 11/16/2019 1. LVEF 55% 2. Normal left ventricular systolic function 3. Normal right ventricular systolic function 4. Mild tricuspid and mitral valve insufficiency 5.  Trace aortic valve 6. No valvular stenosis 7. No evidence of pericardial effusion  LEFT HEART CATHETERIZATION AND CORONARY ANGIOGRAPHY performed on 04/29/2015 1. Dominance: Right 2. Left main: Normal 3. LAD: Mid significant 4. LCx: Insignificant 5. RCA: Large tubular 99% lesion in the right PDA  Diffuse plaque but nonobstructive and LCA, patient an inflow branch of large D1  99% mid PDA with TIMI flow 3  DES x 1 placed   Impression and Plan:  KI AKERMAN has been referred for pre-anesthesia review and clearance prior to him undergoing the planned anesthetic and procedural courses. Available labs, pertinent testing, and imaging results were personally reviewed by me. This patient has been appropriately cleared by internal/family medicine Rosanna Randy, MD) and cardiology Nehemiah Massed, MD).   Based on clinical review performed today (10/14/20), barring any significant acute changes in the patient's overall condition, it is anticipated that he will be able to proceed with the planned surgical intervention. Any acute changes in clinical condition may necessitate his procedure being postponed and/or cancelled. Pre-surgical instructions were reviewed with the patient during his PAT appointment and questions were fielded by PAT clinical staff.  Honor Loh, MSN, APRN, FNP-C, CEN [facility]  Peri-operative Services Nurse Practitioner Phone: [phone] 10/14/20 2:49 PM  NOTE: This note has been prepared using Dragon dictation software. Despite my best ability to proofread, there is always the potential that unintentional transcriptional errors may still occur from this process.

## 2020-10-15 ENCOUNTER — Other Ambulatory Visit
Admission: RE | Admit: 2020-10-15 | Discharge: 2020-10-15 | Disposition: A | Discharge status: Home / Self Care | Payer: PRIVATE HEALTH INSURANCE | Source: Ambulatory Visit | Attending: Orthopedic Surgery | Admitting: Orthopedic Surgery

## 2020-10-15 LAB — SARS CORONAVIRUS 2 (TAT 6-24 HRS): SARS Coronavirus 2: NEGATIVE

## 2020-10-16 ENCOUNTER — Ambulatory Visit: Admission: RE | Admit: 2020-10-16 | Payer: Medicare Other | Source: Ambulatory Visit | Admitting: Orthopedic Surgery

## 2020-10-16 ENCOUNTER — Encounter: Admission: RE | Payer: Self-pay | Source: Ambulatory Visit

## 2020-10-16 SURGERY — SHOULDER ARTHROSCOPY WITH ROTATOR CUFF REPAIR AND SUBACROMIAL DECOMPRESSION
Anesthesia: General | Laterality: Right

## 2020-10-21 ENCOUNTER — Other Ambulatory Visit: Payer: Self-pay

## 2020-10-21 ENCOUNTER — Other Ambulatory Visit
Admission: RE | Admit: 2020-10-21 | Discharge: 2020-10-21 | Disposition: A | Discharge status: Home / Self Care | Payer: Medicare Other | Source: Ambulatory Visit | Attending: Orthopedic Surgery | Admitting: Orthopedic Surgery

## 2020-10-21 DIAGNOSIS — Z20822 Contact with and (suspected) exposure to covid-19: Secondary | ICD-10-CM | POA: Diagnosis not present

## 2020-10-21 DIAGNOSIS — Z01812 Encounter for preprocedural laboratory examination: Secondary | ICD-10-CM | POA: Insufficient documentation

## 2020-10-22 LAB — SARS CORONAVIRUS 2 (TAT 6-24 HRS): SARS Coronavirus 2: NEGATIVE

## 2020-10-23 ENCOUNTER — Encounter: Payer: Self-pay | Admitting: Orthopedic Surgery

## 2020-10-23 ENCOUNTER — Ambulatory Visit
Admission: RE | Admit: 2020-10-23 | Discharge: 2020-10-23 | Disposition: A | Discharge status: Home / Self Care | Payer: Medicare Other | Attending: Orthopedic Surgery | Admitting: Orthopedic Surgery

## 2020-10-23 ENCOUNTER — Ambulatory Visit: Payer: Medicare Other | Admitting: Anesthesiology

## 2020-10-23 ENCOUNTER — Other Ambulatory Visit: Payer: Self-pay

## 2020-10-23 ENCOUNTER — Encounter
Admission: RE | Disposition: A | Discharge status: Home / Self Care | Payer: Self-pay | Source: Home / Self Care | Attending: Orthopedic Surgery

## 2020-10-23 ENCOUNTER — Ambulatory Visit: Payer: Medicare Other

## 2020-10-23 DIAGNOSIS — F418 Other specified anxiety disorders: Secondary | ICD-10-CM | POA: Diagnosis not present

## 2020-10-23 DIAGNOSIS — Z419 Encounter for procedure for purposes other than remedying health state, unspecified: Secondary | ICD-10-CM

## 2020-10-23 DIAGNOSIS — I10 Essential (primary) hypertension: Secondary | ICD-10-CM | POA: Diagnosis not present

## 2020-10-23 DIAGNOSIS — Z8249 Family history of ischemic heart disease and other diseases of the circulatory system: Secondary | ICD-10-CM | POA: Insufficient documentation

## 2020-10-23 DIAGNOSIS — E785 Hyperlipidemia, unspecified: Secondary | ICD-10-CM | POA: Diagnosis not present

## 2020-10-23 DIAGNOSIS — M75101 Unspecified rotator cuff tear or rupture of right shoulder, not specified as traumatic: Secondary | ICD-10-CM | POA: Diagnosis not present

## 2020-10-23 DIAGNOSIS — M75121 Complete rotator cuff tear or rupture of right shoulder, not specified as traumatic: Secondary | ICD-10-CM | POA: Diagnosis not present

## 2020-10-23 DIAGNOSIS — Z823 Family history of stroke: Secondary | ICD-10-CM | POA: Insufficient documentation

## 2020-10-23 DIAGNOSIS — I252 Old myocardial infarction: Secondary | ICD-10-CM | POA: Diagnosis not present

## 2020-10-23 DIAGNOSIS — R7303 Prediabetes: Secondary | ICD-10-CM | POA: Diagnosis not present

## 2020-10-23 DIAGNOSIS — Z885 Allergy status to narcotic agent status: Secondary | ICD-10-CM | POA: Diagnosis not present

## 2020-10-23 DIAGNOSIS — Z801 Family history of malignant neoplasm of trachea, bronchus and lung: Secondary | ICD-10-CM | POA: Diagnosis not present

## 2020-10-23 DIAGNOSIS — Z955 Presence of coronary angioplasty implant and graft: Secondary | ICD-10-CM | POA: Insufficient documentation

## 2020-10-23 DIAGNOSIS — I2510 Atherosclerotic heart disease of native coronary artery without angina pectoris: Secondary | ICD-10-CM | POA: Insufficient documentation

## 2020-10-23 DIAGNOSIS — Z820 Family history of epilepsy and other diseases of the nervous system: Secondary | ICD-10-CM | POA: Insufficient documentation

## 2020-10-23 DIAGNOSIS — Z8052 Family history of malignant neoplasm of bladder: Secondary | ICD-10-CM | POA: Insufficient documentation

## 2020-10-23 DIAGNOSIS — Z7982 Long term (current) use of aspirin: Secondary | ICD-10-CM | POA: Diagnosis not present

## 2020-10-23 DIAGNOSIS — Z79899 Other long term (current) drug therapy: Secondary | ICD-10-CM | POA: Insufficient documentation

## 2020-10-23 DIAGNOSIS — S46011A Strain of muscle(s) and tendon(s) of the rotator cuff of right shoulder, initial encounter: Secondary | ICD-10-CM | POA: Diagnosis not present

## 2020-10-23 DIAGNOSIS — K219 Gastro-esophageal reflux disease without esophagitis: Secondary | ICD-10-CM | POA: Diagnosis not present

## 2020-10-23 HISTORY — PX: SHOULDER ARTHROSCOPY WITH ROTATOR CUFF REPAIR AND SUBACROMIAL DECOMPRESSION: SHX5686

## 2020-10-23 SURGERY — SHOULDER ARTHROSCOPY WITH ROTATOR CUFF REPAIR AND SUBACROMIAL DECOMPRESSION
Anesthesia: General | Site: Shoulder | Laterality: Right

## 2020-10-23 MED ORDER — PHENYLEPHRINE HCL (PRESSORS) 10 MG/ML IV SOLN
INTRAVENOUS | Status: DC | PRN
  Administered 2020-10-23: 200 ug via INTRAVENOUS
  Administered 2020-10-23: 100 ug via INTRAVENOUS

## 2020-10-23 MED ORDER — ONDANSETRON HCL 4 MG/2ML IJ SOLN
INTRAMUSCULAR | Status: DC | PRN
  Administered 2020-10-23: 4 mg via INTRAVENOUS

## 2020-10-23 MED ORDER — ACETAMINOPHEN 500 MG PO TABS
ORAL_TABLET | ORAL | Status: AC
  Administered 2020-10-23: 1000 mg via ORAL
  Filled 2020-10-23: qty 2

## 2020-10-23 MED ORDER — BUPIVACAINE LIPOSOME 1.3 % IJ SUSP
INTRAMUSCULAR | Status: AC
  Filled 2020-10-23: qty 20

## 2020-10-23 MED ORDER — OXYCODONE HCL 5 MG PO TABS: 5.0000 mg | ORAL_TABLET | ORAL | 0 refills | Status: DC | PRN

## 2020-10-23 MED ORDER — DEXAMETHASONE SODIUM PHOSPHATE 10 MG/ML IJ SOLN
INTRAMUSCULAR | Status: DC | PRN
  Administered 2020-10-23: 10 mg via INTRAVENOUS

## 2020-10-23 MED ORDER — LIDOCAINE HCL (CARDIAC) PF 100 MG/5ML IV SOSY
PREFILLED_SYRINGE | INTRAVENOUS | Status: DC | PRN
  Administered 2020-10-23: 80 mg via INTRAVENOUS

## 2020-10-23 MED ORDER — CHLORHEXIDINE GLUCONATE 0.12 % MT SOLN: 15.0000 mL | Freq: Once | OROMUCOSAL | Status: AC

## 2020-10-23 MED ORDER — VASOPRESSIN 20 UNIT/ML IV SOLN
INTRAVENOUS | Status: DC | PRN
  Administered 2020-10-23: 4 [IU] via INTRAVENOUS
  Administered 2020-10-23 (×2): 2 [IU] via INTRAVENOUS

## 2020-10-23 MED ORDER — FENTANYL CITRATE (PF) 100 MCG/2ML IJ SOLN: 50.0000 ug | Freq: Once | INTRAMUSCULAR | Status: AC

## 2020-10-23 MED ORDER — NEOMYCIN-POLYMYXIN B GU 40-200000 IR SOLN
Status: AC
  Filled 2020-10-23: qty 2

## 2020-10-23 MED ORDER — BUPIVACAINE HCL (PF) 0.5 % IJ SOLN
INTRAMUSCULAR | Status: AC
  Filled 2020-10-23: qty 10

## 2020-10-23 MED ORDER — FENTANYL CITRATE (PF) 100 MCG/2ML IJ SOLN
INTRAMUSCULAR | Status: AC
  Administered 2020-10-23: 50 ug via INTRAVENOUS
  Filled 2020-10-23: qty 2

## 2020-10-23 MED ORDER — CEFAZOLIN SODIUM-DEXTROSE 2-4 GM/100ML-% IV SOLN
INTRAVENOUS | Status: AC
  Filled 2020-10-23: qty 100

## 2020-10-23 MED ORDER — EPINEPHRINE PF 1 MG/ML IJ SOLN
INTRAMUSCULAR | Status: AC
  Filled 2020-10-23: qty 1

## 2020-10-23 MED ORDER — ONDANSETRON HCL 4 MG/2ML IJ SOLN: 4.0000 mg | Freq: Once | INTRAMUSCULAR | Status: DC | PRN

## 2020-10-23 MED ORDER — GLYCOPYRROLATE 0.2 MG/ML IJ SOLN
INTRAMUSCULAR | Status: DC | PRN
  Administered 2020-10-23: .2 mg via INTRAVENOUS

## 2020-10-23 MED ORDER — CHLORHEXIDINE GLUCONATE CLOTH 2 % EX PADS
6.0000 | MEDICATED_PAD | Freq: Once | CUTANEOUS | Status: AC
  Administered 2020-10-23: 6 via TOPICAL

## 2020-10-23 MED ORDER — EPINEPHRINE PF 1 MG/ML IJ SOLN
INTRAMUSCULAR | Status: AC
  Filled 2020-10-23: qty 4

## 2020-10-23 MED ORDER — LACTATED RINGERS IV SOLN: INTRAVENOUS | Status: DC

## 2020-10-23 MED ORDER — CEFAZOLIN SODIUM-DEXTROSE 2-4 GM/100ML-% IV SOLN
2.0000 g | INTRAVENOUS | Status: AC
  Administered 2020-10-23: 2 g via INTRAVENOUS

## 2020-10-23 MED ORDER — NEOMYCIN-POLYMYXIN B GU 40-200000 IR SOLN
Status: DC | PRN
  Administered 2020-10-23: 2 mL

## 2020-10-23 MED ORDER — BUPIVACAINE HCL (PF) 0.25 % IJ SOLN
INTRAMUSCULAR | Status: AC
  Filled 2020-10-23: qty 30

## 2020-10-23 MED ORDER — CHLORHEXIDINE GLUCONATE 0.12 % MT SOLN
OROMUCOSAL | Status: AC
  Administered 2020-10-23: 15 mL via OROMUCOSAL
  Filled 2020-10-23: qty 15

## 2020-10-23 MED ORDER — OXYCODONE HCL 5 MG PO TABS
5.0000 mg | ORAL_TABLET | Freq: Once | ORAL | Status: AC | PRN
  Administered 2020-10-23: 5 mg via ORAL

## 2020-10-23 MED ORDER — MIDAZOLAM HCL 2 MG/2ML IJ SOLN: 1.0000 mg | Freq: Once | INTRAMUSCULAR | Status: AC

## 2020-10-23 MED ORDER — FENTANYL CITRATE (PF) 100 MCG/2ML IJ SOLN
INTRAMUSCULAR | Status: DC | PRN
  Administered 2020-10-23: 100 ug via INTRAVENOUS

## 2020-10-23 MED ORDER — LIDOCAINE HCL (PF) 1 % IJ SOLN
INTRAMUSCULAR | Status: AC
  Filled 2020-10-23: qty 5

## 2020-10-23 MED ORDER — OXYCODONE HCL 5 MG/5ML PO SOLN: 5.0000 mg | Freq: Once | ORAL | Status: AC | PRN

## 2020-10-23 MED ORDER — SODIUM CHLORIDE 0.9 % IV SOLN
INTRAVENOUS | Status: DC | PRN
  Administered 2020-10-23: 40 ug/min via INTRAVENOUS

## 2020-10-23 MED ORDER — MIDAZOLAM HCL 2 MG/2ML IJ SOLN
INTRAMUSCULAR | Status: DC | PRN
  Administered 2020-10-23: 2 mg via INTRAVENOUS

## 2020-10-23 MED ORDER — LIDOCAINE HCL (PF) 1 % IJ SOLN
INTRAMUSCULAR | Status: AC
  Filled 2020-10-23: qty 30

## 2020-10-23 MED ORDER — SUCCINYLCHOLINE CHLORIDE 20 MG/ML IJ SOLN
INTRAMUSCULAR | Status: DC | PRN
  Administered 2020-10-23: 100 mg via INTRAVENOUS

## 2020-10-23 MED ORDER — MIDAZOLAM HCL 2 MG/2ML IJ SOLN
INTRAMUSCULAR | Status: AC
  Administered 2020-10-23: 1 mg via INTRAVENOUS
  Filled 2020-10-23: qty 2

## 2020-10-23 MED ORDER — FENTANYL CITRATE (PF) 100 MCG/2ML IJ SOLN
INTRAMUSCULAR | Status: AC
  Filled 2020-10-23: qty 2

## 2020-10-23 MED ORDER — PROPOFOL 10 MG/ML IV BOLUS
INTRAVENOUS | Status: DC | PRN
  Administered 2020-10-23: 120 mg via INTRAVENOUS

## 2020-10-23 MED ORDER — OXYCODONE HCL 5 MG PO TABS
ORAL_TABLET | ORAL | Status: AC
  Filled 2020-10-23: qty 1

## 2020-10-23 MED ORDER — ROCURONIUM BROMIDE 100 MG/10ML IV SOLN
INTRAVENOUS | Status: DC | PRN
  Administered 2020-10-23: 30 mg via INTRAVENOUS
  Administered 2020-10-23: 40 mg via INTRAVENOUS
  Administered 2020-10-23: 30 mg via INTRAVENOUS

## 2020-10-23 MED ORDER — ACETAMINOPHEN 500 MG PO TABS: 1000.0000 mg | ORAL_TABLET | ORAL | Status: AC

## 2020-10-23 MED ORDER — ONDANSETRON HCL 4 MG PO TABS: 4.0000 mg | ORAL_TABLET | Freq: Three times a day (TID) | ORAL | 0 refills | Status: DC | PRN

## 2020-10-23 MED ORDER — FENTANYL CITRATE (PF) 100 MCG/2ML IJ SOLN
25.0000 ug | INTRAMUSCULAR | Status: DC | PRN
  Administered 2020-10-23: 25 ug via INTRAVENOUS

## 2020-10-23 MED ORDER — EPHEDRINE SULFATE 50 MG/ML IJ SOLN
INTRAMUSCULAR | Status: DC | PRN
  Administered 2020-10-23: 15 mg via INTRAVENOUS

## 2020-10-23 MED ORDER — ORAL CARE MOUTH RINSE: 15.0000 mL | Freq: Once | OROMUCOSAL | Status: AC

## 2020-10-23 MED ORDER — EPINEPHRINE PF 1 MG/ML IJ SOLN
INTRAMUSCULAR | Status: DC | PRN
  Administered 2020-10-23: 4 mL

## 2020-10-23 SURGICAL SUPPLY — 74 items
ADAPTER IRRIG TUBE 2 SPIKE SOL (ADAPTER) ×4 IMPLANT
ADPR TBG 2 SPK PMP STRL ASCP (ADAPTER) ×2
ANCHOR ALL-SUT Q-FIX 2.8 (Anchor) ×4 IMPLANT
ANCHOR SUT BIOC ST 3X145 (Anchor) IMPLANT
ANCHOR SUTURE 5.5MM MULTIFIX (Orthopedic Implant) IMPLANT
BUR RADIUS 4.0X18.5 (BURR) ×2 IMPLANT
BUR RADIUS 5.5 (BURR) ×2 IMPLANT
CANISTER SUCT LVC 12 LTR MEDI- (MISCELLANEOUS) ×2 IMPLANT
CANNULA 5.75X7 CRYSTAL CLEAR (CANNULA) ×4 IMPLANT
CANNULA PARTIAL THREAD 2X7 (CANNULA) ×2 IMPLANT
CANNULA TWIST IN 8.25X9CM (CANNULA) ×4 IMPLANT
CONNECTOR PERFECT PASSER (CONNECTOR) ×4 IMPLANT
COOLER POLAR GLACIER W/PUMP (MISCELLANEOUS) ×2 IMPLANT
COVER WAND RF STERILE (DRAPES) ×2 IMPLANT
DEVICE SUCT BLK HOLE OR FLOOR (MISCELLANEOUS) ×4 IMPLANT
DRAPE 3/4 80X56 (DRAPES) ×2 IMPLANT
DRAPE IMP U-DRAPE 54X76 (DRAPES) ×4 IMPLANT
DRAPE INCISE IOBAN 66X45 STRL (DRAPES) ×3 IMPLANT
DRAPE U-SHAPE 47X51 STRL (DRAPES) ×2 IMPLANT
DRSG OPSITE POSTOP 3X4 (GAUZE/BANDAGES/DRESSINGS) ×1 IMPLANT
DRSG OPSITE POSTOP 4X6 (GAUZE/BANDAGES/DRESSINGS) ×1 IMPLANT
DURAPREP 26ML APPLICATOR (WOUND CARE) ×7 IMPLANT
ELECT REM PT RETURN 9FT ADLT (ELECTROSURGICAL) ×2
ELECTRODE REM PT RTRN 9FT ADLT (ELECTROSURGICAL) ×1 IMPLANT
GAUZE SPONGE 4X4 12PLY STRL (GAUZE/BANDAGES/DRESSINGS) ×2 IMPLANT
GAUZE XEROFORM 1X8 LF (GAUZE/BANDAGES/DRESSINGS) ×2 IMPLANT
GLOVE BIOGEL PI IND STRL 9 (GLOVE) ×1 IMPLANT
GLOVE BIOGEL PI INDICATOR 9 (GLOVE) ×1
GLOVE SURG 9.0 ORTHO LTXF (GLOVE) ×6 IMPLANT
GOWN STRL REUS TWL 2XL XL LVL4 (GOWN DISPOSABLE) ×2 IMPLANT
GOWN STRL REUS W/ TWL LRG LVL3 (GOWN DISPOSABLE) ×1 IMPLANT
GOWN STRL REUS W/TWL LRG LVL3 (GOWN DISPOSABLE) ×2
IV LACTATED RINGER IRRG 3000ML (IV SOLUTION) ×20
IV LR IRRIG 3000ML ARTHROMATIC (IV SOLUTION) ×8 IMPLANT
KIT STABILIZATION SHOULDER (MISCELLANEOUS) ×2 IMPLANT
KIT SUTURE 2.8 Q-FIX DISP (MISCELLANEOUS) ×2 IMPLANT
KIT SUTURETAK 3.0 INSERT PERC (KITS) IMPLANT
KIT TURNOVER KIT A (KITS) ×2 IMPLANT
MANIFOLD NEPTUNE II (INSTRUMENTS) ×4 IMPLANT
MASK FACE SPIDER DISP (MASK) ×2 IMPLANT
MAT ABSORB  FLUID 56X50 GRAY (MISCELLANEOUS) ×4
MAT ABSORB FLUID 56X50 GRAY (MISCELLANEOUS) ×3 IMPLANT
NDL SAFETY ECLIPSE 18X1.5 (NEEDLE) ×1 IMPLANT
NEEDLE HYPO 18GX1.5 SHARP (NEEDLE) ×2
NEEDLE HYPO 22GX1.5 SAFETY (NEEDLE) ×2 IMPLANT
NS IRRIG 500ML POUR BTL (IV SOLUTION) ×2 IMPLANT
PACK ARTHROSCOPY SHOULDER (MISCELLANEOUS) ×2 IMPLANT
PAD ARMBOARD 7.5X6 YLW CONV (MISCELLANEOUS) ×4 IMPLANT
PAD WRAPON POLAR SHDR XLG (MISCELLANEOUS) ×1 IMPLANT
PASSER SUT CAPTURE FIRST (INSTRUMENTS) ×2 IMPLANT
SET TUBE SUCT SHAVER OUTFL 24K (TUBING) ×2 IMPLANT
SET TUBE TIP INTRA-ARTICULAR (MISCELLANEOUS) ×2 IMPLANT
SLING ARM LRG DEEP (SOFTGOODS) ×1 IMPLANT
STRIP CLOSURE SKIN 1/2X4 (GAUZE/BANDAGES/DRESSINGS) ×2 IMPLANT
SUT ETHILON 4-0 (SUTURE) ×2
SUT ETHILON 4-0 FS2 18XMFL BLK (SUTURE) ×1
SUT LASSO 90 DEG SD STR (SUTURE) IMPLANT
SUT MNCRL 4-0 (SUTURE) ×2
SUT MNCRL 4-0 27XMFL (SUTURE) ×1
SUT PDS AB 0 CT1 27 (SUTURE) ×6 IMPLANT
SUT PERFECTPASSER WHITE CART (SUTURE) ×4 IMPLANT
SUT SMART STITCH CARTRIDGE (SUTURE) ×4 IMPLANT
SUT ULTRABRAID 2 COBRAID 38 (SUTURE) IMPLANT
SUT VIC AB 0 CT1 36 (SUTURE) ×3 IMPLANT
SUT VIC AB 2-0 CT2 27 (SUTURE) ×2 IMPLANT
SUTURE ETHLN 4-0 FS2 18XMF BLK (SUTURE) ×1 IMPLANT
SUTURE MAGNUM WIRE 2X48 BLK (SUTURE) IMPLANT
SUTURE MNCRL 4-0 27XMF (SUTURE) ×1 IMPLANT
SYR 10ML LL (SYRINGE) ×2 IMPLANT
TAPE MICROFOAM 4IN (TAPE) ×2 IMPLANT
TUBING ARTHRO INFLOW-ONLY STRL (TUBING) ×2 IMPLANT
TUBING CONNECTING 10 (TUBING) ×2 IMPLANT
WAND HAND CNTRL MULTIVAC 90 (MISCELLANEOUS) ×2 IMPLANT
WRAPON POLAR PAD SHDR XLG (MISCELLANEOUS) ×2

## 2020-10-23 NOTE — H&P (Signed)
PREOPERATIVE H&P  Chief Complaint: Right Rotator Cuff Tear  HPI: Michael Leon is a 70 y.o. male who presents for preoperative history and physical with a diagnosis of large retracted right Rotator Cuff Tear confirmed by MRI. Symptoms of pain, weakness and limited ROM are significantly impairing activities of daily living.  He was to proceed with surgical rotator cuff repair.   Past Medical History:  Diagnosis Date  . Arthritis   . ASCVD (arteriosclerotic cardiovascular disease)   . Cervical radiculopathy   . Chest pain, unspecified   . Coronary artery disease   . Depression   . Dysplastic nevus    left ear - treated by Dr. Sharlett Iles  . GAD (generalized anxiety disorder)   . GAD (generalized anxiety disorder)   . GERD (gastroesophageal reflux disease)   . Gout   . HLD (hyperlipidemia)   . Hypertension   . Kidney stone   . Obesity   . Pre-diabetes    borderline. patient unaware of this,  . Reflux   . Sleep apnea    CAN NOT USE HIS CPAP  . STEMI (ST elevation myocardial infarction) ([place]) 2016   2 STENTS  . Vertigo    none recently   Past Surgical History:  Procedure Laterality Date  . ANTERIOR CRUCIATE LIGAMENT REPAIR Right 1993, 2005   1993  . APPENDECTOMY    . CARDIAC CATHETERIZATION  2016   stent  . CHOLECYSTECTOMY    . COLONOSCOPY WITH PROPOFOL N/A 12/31/2019   Procedure: COLONOSCOPY WITH PROPOFOL;  Surgeon: [name], MD;  Location: [place];  Service: Endoscopy;  Laterality: N/A;  . CORONARY STENT PLACEMENT    . EYE SURGERY Left   . TONSILLECTOMY    . UPPER GI ENDOSCOPY     Social History   Socioeconomic History  . Marital status: Single    Spouse name: Not on file  . Number of children: 1  . Years of education: Not on file  . Highest education level: Some college, no degree  Occupational History  . Occupation: Retired Agricultural consultant  Tobacco Use  . Smoking status: Former Smoker    Packs/day: 1.50    Years: 15.00    Pack years: 22.50     Types: Cigarettes    Quit date: 10/18/2002    Years since quitting: 18.0  . Smokeless tobacco: Current User    Types: Chew  . Tobacco comment: Dips  Vaping Use  . Vaping Use: Never used  Substance and Sexual Activity  . Alcohol use: Yes    Alcohol/week: 7.0 - 14.0 standard drinks    Types: 7 - 14 Cans of beer per week    Comment: 1-2 "lite" beers per day  . Drug use: No  . Sexual activity: Not on file  Other Topics Concern  . Not on file  Social History Narrative   Gets regular exercise - walking.    Patient lives alone. His girlfriend, Clarene Critchley lives nearby.   His son comes around to help and may stay with him after surgery.      Social Determinants of Health   Financial Resource Strain: Low Risk   . Difficulty of Paying Living Expenses: Not hard at all  Food Insecurity: No Food Insecurity  . Worried About Charity fundraiser in the Last Year: Never true  . Ran Out of Food in the Last Year: Never true  Transportation Needs: No Transportation Needs  . Lack of Transportation (Medical): No  . Lack of Transportation (Non-Medical):  No  Physical Activity: Inactive  . Days of Exercise per Week: 0 days  . Minutes of Exercise per Session: 0 min  Stress: No Stress Concern Present  . Feeling of Stress : Not at all  Social Connections: Moderately Isolated  . Frequency of Communication with Friends and Family: More than three times a week  . Frequency of Social Gatherings with Friends and Family: More than three times a week  . Attends Religious Services: More than 4 times per year  . Active Member of Clubs or Organizations: No  . Attends Banker Meetings: Never  . Marital Status: Divorced   Family History  Problem Relation Age of Onset  . Cancer Mother        lung  . Stroke Mother   . Multiple sclerosis Mother   . Heart attack Father   . Bladder Cancer Maternal Grandfather   . Heart disease Maternal Grandfather   . Prostate cancer Neg Hx   . Kidney disease Neg  Hx    Allergies  Allergen Reactions  . Codeine Other (See Comments)    Cold sweats (30 years ago) but has taken since with no reaction   Prior to Admission medications   Medication Sig Start Date End Date Taking? Authorizing Provider  atorvastatin (LIPITOR) 80 MG tablet Take by mouth. 08/27/19  Yes [provider]  losartan (COZAAR) 50 MG tablet Take 1 tablet (50 mg total) by mouth daily. 06/26/18  Yes Maple Hudson., MD  pantoprazole (PROTONIX) 40 MG tablet TAKE 1 TABLET(40 MG) BY MOUTH TWICE DAILY Patient taking differently: Take 40 mg by mouth daily. 02/19/20  Yes Maple Hudson., MD  PARoxetine (PAXIL) 40 MG tablet TAKE 1 TABLET(40 MG) BY MOUTH DAILY Patient taking differently: Take 40 mg by mouth daily. 08/13/20  Yes Maple Hudson., MD  triamcinolone cream (KENALOG) 0.1 % Apply 1 application topically daily as needed. 08/11/20  Yes Deirdre Evener, MD  aspirin EC 81 MG tablet Take 81 mg by mouth daily. Swallow whole.    [provider]  phentermine 30 MG capsule Take 1 capsule (30 mg total) by mouth every morning. Patient not taking: No sig reported 07/30/20   Maple Hudson., MD     Positive ROS: All other systems have been reviewed and were otherwise negative with the exception of those mentioned in the HPI and as above.  Physical Exam: General: Alert, no acute distress Cardiovascular: Regular rate and rhythm, no murmurs rubs or gallops.  No pedal edema Respiratory: Clear to auscultation bilaterally, no wheezes rales or rhonchi. No cyanosis, no use of accessory musculature GI: No organomegaly, abdomen is soft and non-tender nondistended with positive bowel sounds. Skin: Skin intact, no lesions within the operative field. Neurologic: Sensation intact distally Psychiatric: Patient is competent for consent with normal mood and affect Lymphatic: No cervical lymphadenopathy  MUSCULOSKELETAL: Right shoulder: Patient can forward elevate  and abduct to only approximately 70 to 80 degrees.  He has weakness of shoulder abduction and external rotation.  He has full digital wrist and elbow range of motion, intact sensation light touch and a palpable radial pulse.  Patient has positive impingement signs but no apprehension or instability.  Assessment: Right Rotator Cuff Tear  Plan: Plan for Procedure(s): RIGHT SHOULDER ARTHROSCOPY WITH MINI-OPEN ROTATOR CUFF REPAIR AND SUBACROMIAL DECOMPRESSION, DISTAL CLAVICLE EXCISION  I reviewed the details of the operation as well as the postoperative course with the patient.  He understands that  he has a large rotator cuff tear which may not be repairable.  He understands that a reverse total shoulder arthroplasty would be a surgical alternative.  He wished to proceed with rotator cuff repair.  I discussed the risks and benefits of surgery. The risks include but are not limited to infection, bleeding, nerve or blood vessel injury, joint stiffness or loss of motion, persistent pain, weakness or instability, retear of the rotator cuff, failure of the repair and the need for further surgery. Medical risks include but are not limited to DVT and pulmonary embolism, myocardial infarction, stroke, pneumonia, respiratory failure and death. Patient understood these risks and wished to proceed.     Juanell Fairly, MD   10/23/2020 8:02 AM

## 2020-10-23 NOTE — Transfer of Care (Addendum)
Immediate Anesthesia Transfer of Care Note  Patient: Michael Leon  Procedure(s) Performed: RIGHT SHOULDER ARTHROSCOPY WITH DEB RIDEMENT (Right Shoulder)  Patient Location: PACU  Anesthesia Type:General  Level of Consciousness: awake, drowsy and patient cooperative  Airway & Oxygen Therapy: Patient Spontanous Breathing and Patient connected to face mask oxygen  Post-op Assessment: Report given to RN and Post -op Vital signs reviewed and stable  Post vital signs: Reviewed and stable  Last Vitals:  Vitals Value Taken Time  BP 141/81 10/23/20 1010  Temp 36.1 C 10/23/20 1010  Pulse 81 10/23/20 1023  Resp 14 10/23/20 1023  SpO2 92 % 10/23/20 1023  Vitals shown include unvalidated device data.  Last Pain:  Vitals:   10/23/20 1010  TempSrc:   PainSc: 0-No pain         Complications: No complications documented.

## 2020-10-23 NOTE — Discharge Instructions (Signed)

## 2020-10-23 NOTE — Op Note (Signed)
10/23/2020  10:30 AM  PATIENT:  Michael Leon    PRE-OPERATIVE DIAGNOSIS:  Right Rotator Cuff Tear  POST-OPERATIVE DIAGNOSIS:  Same  PROCEDURE:  RIGHT SHOULDER DIAGNOSTIC ARTHROSCOPY WITH DEBRIDEMENT  SURGEON:  Thornton Park, MD  ANESTHESIA:   General  PREOPERATIVE INDICATIONS:  Michael Leon is a  70 y.o. male with a diagnosis of large, full thickness Right Rotator Cuff Tear who failed conservative measures and elected for surgical management.    I discussed the risks and benefits of surgery. The risks include but are not limited to infection, bleeding requiring blood transfusion, nerve or blood vessel injury, joint stiffness or loss of motion, persistent pain, weakness or instability, malunion, nonunion and hardware failure and the need for further surgery. Medical risks include but are not limited to DVT and pulmonary embolism, myocardial infarction, stroke, pneumonia, respiratory failure and death. Patient understood these risks and wished to proceed.   OPERATIVE IMPLANTS: None  OPERATIVE PROCEDURE: Patient was met in the preoperative area.  A preop history and physical was performed at the bedside.  Right shoulder was marked with my initials and the word yes according to the hospitalist cracks at the surgery protocol.  He had an interscalene block with Exparel by the anesthesia service in the preoperative area.  He was brought to the operating room and was placed in a beachchair position after undergoing general endotracheal intubation.  A spider arm positioner was used for this case.  Lamination under anesthesia revealed no evidence of passive restriction of motion or instability.  Timeout was performed to verify the the patient's name, date of birth, medical record number, correct site of surgery and correct procedure to be performed.  The timeout was also used to verify the patient had received antibiotics and had all appropriate instruments, implants and radiographic studies were  available in the room.  Once all in attendance were in agreement the case began.  Patient had a posterior portal established with an 11 blade.  The arthroscope was placed through the posterior portal.  An anterior portal was then placed using an 18-gauge spinal needle for localization.  5.75 mm arthroscopic cannula was placed to the anterior portal.  A lateral portal was also established using an 18-gauge spinal needle for localization.  No cannulas placed through the lateral portal.  A full diagnostic examination of the right shoulder was performed.  The patient was found to have a massive rotator cuff tear involving the supraspinatus, infraspinatus and teres minor.  The supraspinatus had a full-thickness and retracted tear.  The avulsed tendon was extensively frayed.  It did not appear to be in condition to hold suture and was deemed repairable.  The greater tuberosity was covered with remnant rotator cuff insertion indicating the tear was intrasubstance.  The infraspinatus could not be identified despite extensive debridement of adhesions and scar tissue, indicating that it had either retracted medially or severely frayed.  The teres minor had a few remaining attached fibers inferiorly.  It was felt that the patient rotator cuff tear was a repairable based on these findings.    The patient's 3 arthroscopic portals were closed with 4-0 nylon.  He was awoken and brought to the PACU in stable condition with a sling on his right arm.  I spoke with the patient's girlfriend, at his request, to explain to her that the patient was stable in the recovery room but unfortunately the rotator cuff could not be repaired.  I will refer the patient to one of  my partners to discuss the possibility for a reverse total shoulder arthroplasty in the near future.

## 2020-10-23 NOTE — Anesthesia Procedure Notes (Signed)
Procedure Name: Intubation Performed by: Mohammed Kindle, CRNA Pre-anesthesia Checklist: Patient identified, Emergency Drugs available, Suction available and Patient being monitored Patient Re-evaluated:Patient Re-evaluated prior to induction Oxygen Delivery Method: Circle system utilized Preoxygenation: Pre-oxygenation with 100% oxygen Induction Type: IV induction Ventilation: Mask ventilation without difficulty Laryngoscope Size: McGraph Grade View: Grade I Tube type: Oral Tube size: 7.0 mm Number of attempts: 1 Airway Equipment and Method: Stylet and Oral airway Placement Confirmation: ETT inserted through vocal cords under direct vision,  positive ETCO2,  breath sounds checked- equal and bilateral and CO2 detector Secured at: 21 cm Tube secured with: Tape Dental Injury: Teeth and Oropharynx as per pre-operative assessment

## 2020-10-23 NOTE — Anesthesia Preprocedure Evaluation (Addendum)
Anesthesia Evaluation  Patient identified by MRN, date of birth, ID band Patient awake    Reviewed: Allergy & Precautions, H&P , NPO status , Patient's Chart, lab work & pertinent test results  History of Anesthesia Complications Negative for: history of anesthetic complications  Airway Mallampati: III  TM Distance: <3 FB    Comment: Large neck Dental  (+) Chipped,    Pulmonary sleep apnea , neg COPD, former smoker,    breath sounds clear to auscultation       Cardiovascular hypertension, (-) angina+ CAD, + Past MI and + Cardiac Stents  (-) dysrhythmias  Rhythm:regular Rate:Normal     Neuro/Psych PSYCHIATRIC DISORDERS Anxiety Depression Cervical radiculopathy negative neurological ROS     GI/Hepatic Neg liver ROS, GERD  ,  Endo/Other  negative endocrine ROS  Renal/GU      Musculoskeletal   Abdominal   Peds  Hematology negative hematology ROS (+)   Anesthesia Other Findings Past Medical History: No date: Arthritis No date: ASCVD (arteriosclerotic cardiovascular disease) No date: Cervical radiculopathy No date: Chest pain, unspecified No date: Coronary artery disease No date: Depression No date: Dysplastic nevus     Comment:  left ear - treated by Dr. Jarold Motto No date: GAD (generalized anxiety disorder) No date: GAD (generalized anxiety disorder) No date: GERD (gastroesophageal reflux disease) No date: Gout No date: HLD (hyperlipidemia) No date: Hypertension No date: Kidney stone No date: Obesity No date: Pre-diabetes     Comment:  borderline. patient unaware of this, No date: Reflux No date: Sleep apnea     Comment:  CAN NOT USE HIS CPAP 2016: STEMI (ST elevation myocardial infarction) (HCC)     Comment:  2 STENTS No date: Vertigo     Comment:  none recently  Past Surgical History: 1993, 2005: ANTERIOR CRUCIATE LIGAMENT REPAIR; Right     Comment:  1993 No date: APPENDECTOMY 2016: CARDIAC  CATHETERIZATION     Comment:  stent No date: CHOLECYSTECTOMY 12/31/2019: COLONOSCOPY WITH PROPOFOL; N/A     Comment:  Procedure: COLONOSCOPY WITH PROPOFOL;  Surgeon: Midge Minium, MD;  Location: [facility];  Service:               Endoscopy;  Laterality: N/A; No date: CORONARY STENT PLACEMENT No date: EYE SURGERY; Left No date: TONSILLECTOMY No date: UPPER GI ENDOSCOPY  BMI    Body Mass Index: 34.95 kg/m      Reproductive/Obstetrics negative OB ROS                           Anesthesia Physical Anesthesia Plan  ASA: III  Anesthesia Plan: General ETT   Post-op Pain Management: GA combined w/ Regional for post-op pain   Induction:   PONV Risk Score and Plan: Ondansetron, Dexamethasone, Midazolam and Treatment may vary due to age or medical condition  Airway Management Planned:   Additional Equipment:   Intra-op Plan:   Post-operative Plan:   Informed Consent: I have reviewed the patients History and Physical, chart, labs and discussed the procedure including the risks, benefits and alternatives for the proposed anesthesia with the patient or authorized representative who has indicated his/her understanding and acceptance.     Dental Advisory Given  Plan Discussed with: Anesthesiologist, CRNA and Surgeon  Anesthesia Plan Comments:         Anesthesia Quick Evaluation

## 2020-10-24 ENCOUNTER — Telehealth: Payer: Self-pay

## 2020-10-24 ENCOUNTER — Ambulatory Visit: Payer: Self-pay

## 2020-10-24 ENCOUNTER — Telehealth (INDEPENDENT_AMBULATORY_CARE_PROVIDER_SITE_OTHER): Payer: Medicare Other | Admitting: Family Medicine

## 2020-10-24 DIAGNOSIS — I2510 Atherosclerotic heart disease of native coronary artery without angina pectoris: Secondary | ICD-10-CM

## 2020-10-24 DIAGNOSIS — R059 Cough, unspecified: Secondary | ICD-10-CM

## 2020-10-24 NOTE — Telephone Encounter (Signed)
Copied from [place] [id number]. Topic: General - Other >> Oct 24, 2020 12:17 PM Celene Kras wrote: Reason for CRM: Pt called and is requesting to have a nurse give a call back regarding his appt. He states that he was waiting on a call, but missed when someone called and was not able to reach anyone else. Please advise.

## 2020-10-24 NOTE — Progress Notes (Signed)
MyChart Video Visit    Virtual Visit via Video Note   This visit type was conducted due to national recommendations for restrictions regarding the COVID-19 Pandemic (e.g. social distancing) in an effort to limit this patient's exposure and mitigate transmission in our community. This patient is at least at moderate risk for complications without adequate follow up. This format is felt to be most appropriate for this patient at this time. Physical exam was limited by quality of the video and audio technology used for the visit.   Patient location: Home Provider location: Office  I discussed the limitations of evaluation and management by telemedicine and the availability of in person appointments. The patient expressed understanding and agreed to proceed.  Patient: Michael Leon   DOB: Feb 16, 1951   70 y.o. Male  MRN: [id number] Visit Date: 10/24/2020  Today's healthcare provider: Vernie Murders, PA-C   Chief Complaint  Patient presents with   Shortness of Breath   Subjective    HPI  Patient reports he is feeling much better today. Patient reports he feels like his "lungs were a sleep" due to the anesthesia. Patient denies any dizziness, chills, fever, or congestions. Patient reports eating and drinking well, and reports being active without having shortness of breath.   Patient Active Problem List   Diagnosis Date Noted   Personal history of colonic polyps    Chest pain 12/30/2018   Breathlessness on exertion 06/05/2015   Dizziness 05/08/2015   Arteriosclerosis of coronary artery 05/07/2015   HLD (hyperlipidemia) 05/07/2015   BP (high blood pressure) 05/07/2015   Obesity 05/07/2015   Depression 05/07/2015   GAD (generalized anxiety disorder) 05/07/2015   Insomnia 05/07/2015   GERD (gastroesophageal reflux disease) 05/07/2015   Gout 05/07/2015   Cervical radiculopathy 05/07/2015   Prediabetes 05/07/2015   History of kidney stones 05/07/2015   Benign essential HTN  05/05/2015   Anxiety 04/30/2015   Borderline diabetes 04/30/2015   ST elevation (STEMI) myocardial infarction ([place]) 04/30/2015   Blood glucose elevated 04/30/2015   CHEST PAIN UNSPECIFIED 06/19/2009   Social History   Tobacco Use   Smoking status: Former Smoker    Packs/day: 1.50    Years: 15.00    Pack years: 22.50    Types: Cigarettes    Quit date: 10/18/2002    Years since quitting: 18.0   Smokeless tobacco: Current User    Types: Chew   Tobacco comment: Dips  Vaping Use   Vaping Use: Never used  Substance Use Topics   Alcohol use: Yes    Alcohol/week: 7.0 - 14.0 standard drinks    Types: 7 - 14 Cans of beer per week    Comment: 1-2 "lite" beers per day   Drug use: No   Allergies  Allergen Reactions   Codeine Other (See Comments)    Cold sweats (30 years ago) but has taken since with no reaction      Medications: Outpatient Medications Prior to Visit  Medication Sig   aspirin EC 81 MG tablet Take 81 mg by mouth daily. Swallow whole.   atorvastatin (LIPITOR) 80 MG tablet Take by mouth.   losartan (COZAAR) 50 MG tablet Take 1 tablet (50 mg total) by mouth daily.   pantoprazole (PROTONIX) 40 MG tablet TAKE 1 TABLET(40 MG) BY MOUTH TWICE DAILY (Patient taking differently: Take 40 mg by mouth daily.)   PARoxetine (PAXIL) 40 MG tablet TAKE 1 TABLET(40 MG) BY MOUTH DAILY (Patient taking differently: Take 40 mg by mouth daily.)  triamcinolone cream (KENALOG) 0.1 % Apply 1 application topically daily as needed.   phentermine 30 MG capsule Take 1 capsule (30 mg total) by mouth every morning. (Patient not taking: No sig reported)   [DISCONTINUED] ondansetron (ZOFRAN) 4 MG tablet Take 1 tablet (4 mg total) by mouth every 8 (eight) hours as needed for nausea or vomiting. (Patient not taking: Reported on 10/24/2020)   [DISCONTINUED] oxyCODONE (OXY IR/ROXICODONE) 5 MG immediate release tablet Take 1 tablet (5 mg total) by mouth every 4 (four) hours as needed. (Patient not taking:  Reported on 10/24/2020)   No facility-administered medications prior to visit.    Review of Systems  Constitutional: Negative for chills and fever.  Respiratory: Positive for cough and shortness of breath. Negative for wheezing.   Cardiovascular: Negative for chest pain and palpitations.  Neurological: Negative for dizziness and light-headedness.    Last CBC Lab Results  Component Value Date   WBC 6.9 10/14/2020   HGB 16.6 10/14/2020   HCT 47.0 10/14/2020   MCV 92.5 10/14/2020   MCH 32.7 10/14/2020   RDW 13.1 10/14/2020   PLT 227 42/59/5638   Last metabolic panel Lab Results  Component Value Date   GLUCOSE 133 (H) 10/14/2020   NA 137 10/14/2020   K 4.1 10/14/2020   CL 103 10/14/2020   CO2 24 10/14/2020   BUN 13 10/14/2020   CREATININE 0.98 10/14/2020   GFRNONAA >60 10/14/2020   GFRAA 91 01/29/2020   CALCIUM 9.1 10/14/2020   PROT 6.7 01/29/2020   ALBUMIN 4.4 01/29/2020   LABGLOB 2.3 01/29/2020   AGRATIO 1.9 01/29/2020   BILITOT 1.0 01/29/2020   ALKPHOS 67 01/29/2020   AST 17 01/29/2020   ALT 20 01/29/2020   ANIONGAP 10 10/14/2020   Last lipids Lab Results  Component Value Date   CHOL 145 01/29/2020   HDL 42 01/29/2020   LDLCALC 72 01/29/2020   LDLDIRECT 127.0 05/03/2007   TRIG 183 (H) 01/29/2020   CHOLHDL 3.5 01/29/2020      Objective    There were no vitals taken for this visit. BP Readings from Last 3 Encounters:  10/23/20 125/72  07/30/20 130/69  01/29/20 127/79   Wt Readings from Last 3 Encounters:  10/23/20 210 lb (95.3 kg)  10/14/20 210 lb (95.3 kg)  07/30/20 210 lb (95.3 kg)    Physical Exam: WDWN male in no apparent distress.  Head: Normocephalic, atraumatic. Neck: Supple, NROM Respiratory: No apparent distress Psych: Normal mood and affect   Assessment & Plan     1. Cough Noticed some slight cough and a little shortness of breath since right shoulder rotator cuff tear repair yesterday by Dr. Mack Guise. No chest pains, wheeze,  hemoptysis or sputum production. No dyspnea during video evaluation. Denies fever, congestion, loss of taste or smell. Monitor at home for signs of PE or COVID infection. Drink extra fluids and recheck prn.   No follow-ups on file.     I discussed the assessment and treatment plan with the patient. The patient was provided an opportunity to ask questions and all were answered. The patient agreed with the plan and demonstrated an understanding of the instructions.   The patient was advised to call back or seek an in-person evaluation if the symptoms worsen or if the condition fails to improve as anticipated.  I provided 10 minutes of non-face-to-face time during this encounter.  I, Deiondre Harrower, PA-C, have reviewed all documentation for this visit. The documentation on 04/22/21 for the exam, diagnosis,  procedures, and orders are all accurate and complete.   Vernie Murders, PA-C [employer] [phone] (phone) [phone] (fax)  [place]

## 2020-10-24 NOTE — Telephone Encounter (Signed)
Patient called stating that he is SOB after surgery. He states he had shoulder surgery yesterday and they place a block and put him to sleep.  He states when he woke from surgery he remembers that nurse telling him once his O2 saturation came up her would be discharged. He states that he came home and has been SOB with activity. He is fine sitting He had some difficulty sleeping last night. Patient states he has uses his breathing device they gave him after surgery. He has a productive cough today. Patient was told deep breathing was required after surgery to expand lungs  and reduce post op pneumonia.  He verbalized understanding. Per protocol virtual appointment with office help was scheduled.    Reason for Disposition . [1] MILD difficulty breathing (e.g., minimal/no SOB at rest, SOB with walking, pulse <100) AND [2] NEW-onset or WORSE than normal  Answer Assessment - Initial Assessment Questions 1. RESPIRATORY STATUS: "Describe your breathing?" (e.g., wheezing, shortness of breath, unable to speak, severe coughing)      SOB 2. ONSET: "When did this breathing problem begin?"      After surgery 3. PATTERN "Does the difficult breathing come and go, or has it been constant since it started?"     Movement causes SOB 4. SEVERITY: "How bad is your breathing?" (e.g., mild, moderate, severe)    - MILD: No SOB at rest, mild SOB with walking, speaks normally in sentences, can lay down, no retractions, pulse < 100.    - MODERATE: SOB at rest, SOB with minimal exertion and prefers to sit, cannot lie down flat, speaks in phrases, mild retractions, audible wheezing, pulse 100-120.    - SEVERE: Very SOB at rest, speaks in single words, struggling to breathe, sitting hunched forward, retractions, pulse > 120     Mild 5. RECURRENT SYMPTOM: "Have you had difficulty breathing before?" If Yes, ask: "When was the last time?" and "What happened that time?"      N/A  Healthy before surgery 6. CARDIAC HISTORY: "Do you  have any history of heart disease?" (e.g., heart attack, angina, bypass surgery, angioplasty)     MI 7. LUNG HISTORY: "Do you have any history of lung disease?"  (e.g., pulmonary embolus, asthma, emphysema)     no 8. CAUSE: "What do you think is causing the breathing problem?"     surgery 9. OTHER SYMPTOMS: "Do you have any other symptoms? (e.g., dizziness, runny nose, cough, chest pain, fever)    cough 10. PREGNANCY: "Is there any chance you are pregnant?" "When was your last menstrual period?"     N/A 11. TRAVEL: "Have you traveled out of the country in the last month?" (e.g., travel history, exposures)       N/A  Protocols used: BREATHING DIFFICULTY-A-AH

## 2020-11-03 NOTE — Anesthesia Postprocedure Evaluation (Signed)
Anesthesia Post Note  Patient: Michael Leon  Procedure(s) Performed: RIGHT SHOULDER ARTHROSCOPY WITH DEB RIDEMENT (Right Shoulder)  Patient location during evaluation: PACU Anesthesia Type: General Level of consciousness: awake and alert Pain management: pain level controlled Vital Signs Assessment: post-procedure vital signs reviewed and stable Respiratory status: spontaneous breathing, nonlabored ventilation and respiratory function stable Cardiovascular status: blood pressure returned to baseline and stable Postop Assessment: no apparent nausea or vomiting Anesthetic complications: no   No complications documented.   Last Vitals:  Vitals:   10/23/20 1108 10/23/20 1118  BP:  125/72  Pulse: 88   Resp: 17 20  Temp: (!) 36.1 C (!) 36.2 C  SpO2: 94% 98%    Last Pain:  Vitals:   10/24/20 0827  TempSrc:   PainSc: 0-No pain                 Brett Canales [facility]

## 2020-11-05 ENCOUNTER — Other Ambulatory Visit: Payer: Self-pay

## 2020-11-05 ENCOUNTER — Encounter: Payer: Self-pay | Admitting: Family Medicine

## 2020-11-05 ENCOUNTER — Ambulatory Visit (INDEPENDENT_AMBULATORY_CARE_PROVIDER_SITE_OTHER): Payer: Medicare Other | Admitting: Family Medicine

## 2020-11-05 VITALS — BP 133/88 | HR 77 | Temp 99.3°F | Resp 16 | Ht 64.0 in | Wt 207.0 lb

## 2020-11-05 DIAGNOSIS — R7303 Prediabetes: Secondary | ICD-10-CM | POA: Diagnosis not present

## 2020-11-05 DIAGNOSIS — E782 Mixed hyperlipidemia: Secondary | ICD-10-CM

## 2020-11-05 DIAGNOSIS — Z6836 Body mass index (BMI) 36.0-36.9, adult: Secondary | ICD-10-CM

## 2020-11-05 DIAGNOSIS — I10 Essential (primary) hypertension: Secondary | ICD-10-CM | POA: Diagnosis not present

## 2020-11-05 MED ORDER — PHENTERMINE HCL 30 MG PO CAPS: 30.0000 mg | ORAL_CAPSULE | ORAL | 2 refills | Status: DC

## 2020-11-05 NOTE — Patient Instructions (Incomplete)
Diabetes Mellitus and Nutrition, Adult When you have diabetes, or diabetes mellitus, it is very important to have healthy eating habits because your blood sugar (glucose) levels are greatly affected by what you eat and drink. Eating healthy foods in the right amounts, at about the same times every day, can help you:  Control your blood glucose.  Lower your risk of heart disease.  Improve your blood pressure.  Reach or maintain a healthy weight. What can affect my meal plan? Every person with diabetes is different, and each person has different needs for a meal plan. Your health care provider may recommend that you work with a dietitian to make a meal plan that is best for you. Your meal plan may vary depending on factors such as:  The calories you need.  The medicines you take.  Your weight.  Your blood glucose, blood pressure, and cholesterol levels.  Your activity level.  Other health conditions you have, such as heart or kidney disease. How do carbohydrates affect me? Carbohydrates, also called carbs, affect your blood glucose level more than any other type of food. Eating carbs naturally raises the amount of glucose in your blood. Carb counting is a method for keeping track of how many carbs you eat. Counting carbs is important to keep your blood glucose at a healthy level, especially if you use insulin or take certain oral diabetes medicines. It is important to know how many carbs you can safely have in each meal. This is different for every person. Your dietitian can help you calculate how many carbs you should have at each meal and for each snack. How does alcohol affect me? Alcohol can cause a sudden decrease in blood glucose (hypoglycemia), especially if you use insulin or take certain oral diabetes medicines. Hypoglycemia can be a life-threatening condition. Symptoms of hypoglycemia, such as sleepiness, dizziness, and confusion, are similar to symptoms of having too much  alcohol.  Do not drink alcohol if: ? Your health care provider tells you not to drink. ? You are pregnant, may be pregnant, or are planning to become pregnant.  If you drink alcohol: ? Do not drink on an empty stomach. ? Limit how much you use to:  0-1 drink a day for women.  0-2 drinks a day for men. ? Be aware of how much alcohol is in your drink. In the [place] one drink equals one 12 oz bottle of beer (355 mL), one 5 oz glass of wine (148 mL), or one 1 oz glass of hard liquor (44 mL). ? Keep yourself hydrated with water, diet soda, or unsweetened iced tea.  Keep in mind that regular soda, juice, and other mixers may contain a lot of sugar and must be counted as carbs. What are tips for following this plan? Reading food labels  Start by checking the serving size on the "Nutrition Facts" label of packaged foods and drinks. The amount of calories, carbs, fats, and other nutrients listed on the label is based on one serving of the item. Many items contain more than one serving per package.  Check the total grams (g) of carbs in one serving. You can calculate the number of servings of carbs in one serving by dividing the total carbs by 15. For example, if a food has 30 g of total carbs per serving, it would be equal to 2 servings of carbs.  Check the number of grams (g) of saturated fats and trans fats in one serving. Choose foods that have   a low amount or none of these fats.  Check the number of milligrams (mg) of salt (sodium) in one serving. Most people should limit total sodium intake to less than 2,300 mg per day.  Always check the nutrition information of foods labeled as "low-fat" or "nonfat." These foods may be higher in added sugar or refined carbs and should be avoided.  Talk to your dietitian to identify your daily goals for nutrients listed on the label. Shopping  Avoid buying canned, pre-made, or processed foods. These foods tend to be high in fat, sodium, and added  sugar.  Shop around the outside edge of the grocery store. This is where you will most often find fresh fruits and vegetables, bulk grains, fresh meats, and fresh dairy. Cooking  Use low-heat cooking methods, such as baking, instead of high-heat cooking methods like deep frying.  Cook using healthy oils, such as olive, canola, or sunflower oil.  Avoid cooking with butter, cream, or high-fat meats. Meal planning  Eat meals and snacks regularly, preferably at the same times every day. Avoid going long periods of time without eating.  Eat foods that are high in fiber, such as fresh fruits, vegetables, beans, and whole grains. Talk with your dietitian about how many servings of carbs you can eat at each meal.  Eat 4-6 oz (112-168 g) of lean protein each day, such as lean meat, chicken, fish, eggs, or tofu. One ounce (oz) of lean protein is equal to: ? 1 oz (28 g) of meat, chicken, or fish. ? 1 egg. ?  cup (62 g) of tofu.  Eat some foods each day that contain healthy fats, such as avocado, nuts, seeds, and fish.   What foods should I eat? Fruits Berries. Apples. Oranges. Peaches. Apricots. Plums. Grapes. Mango. Papaya. Pomegranate. Kiwi. Cherries. Vegetables Lettuce. Spinach. Leafy greens, including kale, chard, collard greens, and mustard greens. Beets. Cauliflower. Cabbage. Broccoli. Carrots. Green beans. Tomatoes. Peppers. Onions. Cucumbers. Brussels sprouts. Grains Whole grains, such as whole-wheat or whole-grain bread, crackers, tortillas, cereal, and pasta. Unsweetened oatmeal. Quinoa. Brown or wild rice. Meats and other proteins Seafood. Poultry without skin. Lean cuts of poultry and beef. Tofu. Nuts. Seeds. Dairy Low-fat or fat-free dairy products such as milk, yogurt, and cheese. The items listed above may not be a complete list of foods and beverages you can eat. Contact a dietitian for more information. What foods should I avoid? Fruits Fruits canned with  syrup. Vegetables Canned vegetables. Frozen vegetables with butter or cream sauce. Grains Refined white flour and flour products such as bread, pasta, snack foods, and cereals. Avoid all processed foods. Meats and other proteins Fatty cuts of meat. Poultry with skin. Breaded or fried meats. Processed meat. Avoid saturated fats. Dairy Full-fat yogurt, cheese, or milk. Beverages Sweetened drinks, such as soda or iced tea. The items listed above may not be a complete list of foods and beverages you should avoid. Contact a dietitian for more information. Questions to ask a health care provider  Do I need to meet with a diabetes educator?  Do I need to meet with a dietitian?  What number can I call if I have questions?  When are the best times to check my blood glucose? Where to find more information:  American Diabetes Association: diabetes.org  Academy of Nutrition and Dietetics: www.eatright.org  National Institute of Diabetes and Digestive and Kidney Diseases: www.niddk.nih.gov  Association of Diabetes Care and Education Specialists: www.diabeteseducator.org Summary  It is important to have healthy eating   habits because your blood sugar (glucose) levels are greatly affected by what you eat and drink.  A healthy meal plan will help you control your blood glucose and maintain a healthy lifestyle.  Your health care provider may recommend that you work with a dietitian to make a meal plan that is best for you.  Keep in mind that carbohydrates (carbs) and alcohol have immediate effects on your blood glucose levels. It is important to count carbs and to use alcohol carefully. This information is not intended to replace advice given to you by your health care provider. Make sure you discuss any questions you have with your health care provider. Document Revised: 09/11/2019 Document Reviewed: 09/11/2019 Elsevier Patient Education  2021 Elsevier Inc. Hypertension, Adult High blood  pressure (hypertension) is when the force of blood pumping through the arteries is too strong. The arteries are the blood vessels that carry blood from the heart throughout the body. Hypertension forces the heart to work harder to pump blood and may cause arteries to become narrow or stiff. Untreated or uncontrolled hypertension can cause a heart attack, heart failure, a stroke, kidney disease, and other problems. A blood pressure reading consists of a higher number over a lower number. Ideally, your blood pressure should be below 120/80. The first ("top") number is called the systolic pressure. It is a measure of the pressure in your arteries as your heart beats. The second ("bottom") number is called the diastolic pressure. It is a measure of the pressure in your arteries as the heart relaxes. What are the causes? The exact cause of this condition is not known. There are some conditions that result in or are related to high blood pressure. What increases the risk? Some risk factors for high blood pressure are under your control. The following factors may make you more likely to develop this condition:  Smoking.  Having type 2 diabetes mellitus, high cholesterol, or both.  Not getting enough exercise or physical activity.  Being overweight.  Having too much fat, sugar, calories, or salt (sodium) in your diet.  Drinking too much alcohol. Some risk factors for high blood pressure may be difficult or impossible to change. Some of these factors include:  Having chronic kidney disease.  Having a family history of high blood pressure.  Age. Risk increases with age.  Race. You may be at higher risk if you are African American.  Gender. Men are at higher risk than women before age 45. After age 65, women are at higher risk than men.  Having obstructive sleep apnea.  Stress. What are the signs or symptoms? High blood pressure may not cause symptoms. Very high blood pressure (hypertensive  crisis) may cause:  Headache.  Anxiety.  Shortness of breath.  Nosebleed.  Nausea and vomiting.  Vision changes.  Severe chest pain.  Seizures. How is this diagnosed? This condition is diagnosed by measuring your blood pressure while you are seated, with your arm resting on a flat surface, your legs uncrossed, and your feet flat on the floor. The cuff of the blood pressure monitor will be placed directly against the skin of your upper arm at the level of your heart. It should be measured at least twice using the same arm. Certain conditions can cause a difference in blood pressure between your right and left arms. Certain factors can cause blood pressure readings to be lower or higher than normal for a short period of time:  When your blood pressure is higher when you   are in a health care provider's office than when you are at home, this is called white coat hypertension. Most people with this condition do not need medicines.  When your blood pressure is higher at home than when you are in a health care provider's office, this is called masked hypertension. Most people with this condition may need medicines to control blood pressure. If you have a high blood pressure reading during one visit or you have normal blood pressure with other risk factors, you may be asked to:  Return on a different day to have your blood pressure checked again.  Monitor your blood pressure at home for 1 week or longer. If you are diagnosed with hypertension, you may have other blood or imaging tests to help your health care provider understand your overall risk for other conditions. How is this treated? This condition is treated by making healthy lifestyle changes, such as eating healthy foods, exercising more, and reducing your alcohol intake. Your health care provider may prescribe medicine if lifestyle changes are not enough to get your blood pressure under control, and if:  Your systolic blood pressure  is above 130.  Your diastolic blood pressure is above 80. Your personal target blood pressure may vary depending on your medical conditions, your age, and other factors. Follow these instructions at home: Eating and drinking  Eat a diet that is high in fiber and potassium, and low in sodium, added sugar, and fat. An example eating plan is called the DASH (Dietary Approaches to Stop Hypertension) diet. To eat this way: ? Eat plenty of fresh fruits and vegetables. Try to fill one half of your plate at each meal with fruits and vegetables. ? Eat whole grains, such as whole-wheat pasta, brown rice, or whole-grain bread. Fill about one fourth of your plate with whole grains. ? Eat or drink low-fat dairy products, such as skim milk or low-fat yogurt. ? Avoid fatty cuts of meat, processed or cured meats, and poultry with skin. Fill about one fourth of your plate with lean proteins, such as fish, chicken without skin, beans, eggs, or tofu. ? Avoid pre-made and processed foods. These tend to be higher in sodium, added sugar, and fat.  Reduce your daily sodium intake. Most people with hypertension should eat less than 1,500 mg of sodium a day.  Do not drink alcohol if: ? Your health care provider tells you not to drink. ? You are pregnant, may be pregnant, or are planning to become pregnant.  If you drink alcohol: ? Limit how much you use to:  0-1 drink a day for women.  0-2 drinks a day for men. ? Be aware of how much alcohol is in your drink. In the [place] one drink equals one 12 oz bottle of beer (355 mL), one 5 oz glass of wine (148 mL), or one 1 oz glass of hard liquor (44 mL).   Lifestyle  Work with your health care provider to maintain a healthy body weight or to lose weight. Ask what an ideal weight is for you.  Get at least 30 minutes of exercise most days of the week. Activities may include walking, swimming, or biking.  Include exercise to strengthen your muscles (resistance  exercise), such as Pilates or lifting weights, as part of your weekly exercise routine. Try to do these types of exercises for 30 minutes at least 3 days a week.  Do not use any products that contain nicotine or tobacco, such as cigarettes, e-cigarettes, and   chewing tobacco. If you need help quitting, ask your health care provider.  Monitor your blood pressure at home as told by your health care provider.  Keep all follow-up visits as told by your health care provider. This is important.   Medicines  Take over-the-counter and prescription medicines only as told by your health care provider. Follow directions carefully. Blood pressure medicines must be taken as prescribed.  Do not skip doses of blood pressure medicine. Doing this puts you at risk for problems and can make the medicine less effective.  Ask your health care provider about side effects or reactions to medicines that you should watch for. Contact a health care provider if you:  Think you are having a reaction to a medicine you are taking.  Have headaches that keep coming back (recurring).  Feel dizzy.  Have swelling in your ankles.  Have trouble with your vision. Get help right away if you:  Develop a severe headache or confusion.  Have unusual weakness or numbness.  Feel faint.  Have severe pain in your chest or abdomen.  Vomit repeatedly.  Have trouble breathing. Summary  Hypertension is when the force of blood pumping through your arteries is too strong. If this condition is not controlled, it may put you at risk for serious complications.  Your personal target blood pressure may vary depending on your medical conditions, your age, and other factors. For most people, a normal blood pressure is less than 120/80.  Hypertension is treated with lifestyle changes, medicines, or a combination of both. Lifestyle changes include losing weight, eating a healthy, low-sodium diet, exercising more, and limiting  alcohol. This information is not intended to replace advice given to you by your health care provider. Make sure you discuss any questions you have with your health care provider. Document Revised: 06/14/2018 Document Reviewed: 06/14/2018 Elsevier Patient Education  2021 Elsevier Inc.  

## 2020-11-05 NOTE — Progress Notes (Signed)
Established patient visit   Patient: Michael Leon   DOB: 1951/07/09   70 y.o. Male  MRN: [id number] Visit Date: 11/05/2020  Today's healthcare provider: Wilhemena Durie, MD   Chief Complaint  Patient presents with  . Hypertension  . Prediabetes  . Obesity   Subjective    HPI  Patient feels well.  The patient feels well.  He would like some phentermine to try to lose some weight.  This has worked in the past.  Overall patient feels well. Hypertension, follow-up  BP Readings from Last 3 Encounters:  11/05/20 133/88  10/23/20 125/72  07/30/20 130/69   Wt Readings from Last 3 Encounters:  11/05/20 207 lb (93.9 kg)  10/23/20 210 lb (95.3 kg)  10/14/20 210 lb (95.3 kg)     He was last seen for hypertension 3 months ago.  BP at that visit was 130/69. Management since that visit includes; Good control with losartan. He reports excellent compliance with treatment. He is not having side effects.  He is exercising. He is not adherent to low salt diet.   Outside blood pressures are not being checked.  He does not smoke.  Use of agents associated with hypertension: none.   ---------------------------------------------------------------------------------------------------  Prediabetes, Follow-up  Lab Results  Component Value Date   HGBA1C 6.2 (A) 08/07/2020   HGBA1C 6.2 (H) 01/29/2020   HGBA1C 6.5 (A) 11/16/2018   GLUCOSE 133 (H) 10/14/2020   GLUCOSE 106 (H) 01/29/2020   GLUCOSE 125 (H) 12/29/2018    Last seen for for this3 months ago.  Management since that visit includes; no changes were made at this visit. Current symptoms include none and have been stable.  Prior visit with dietician: no Current diet: not asked Current exercise: cardiovascular workout on exercise equipment and walking  Pertinent Labs:    Component Value Date/Time   CHOL 145 01/29/2020 0947   TRIG 183 (H) 01/29/2020 0947   CHOLHDL 3.5 01/29/2020 0947   CHOLHDL 5.0 CALC  05/03/2007 1022   CREATININE 0.98 10/14/2020 1128   CREATININE 1.17 03/29/2012 0146    Wt Readings from Last 3 Encounters:  11/05/20 207 lb (93.9 kg)  10/23/20 210 lb (95.3 kg)  10/14/20 210 lb (95.3 kg)    ----------------------------------------------------------------------------------------- Follow up for weight  The patient was last seen for this 3 months ago. Changes made at last visit include start phentermine 30 mg daily.  He reports not starting medication, reports pharmacy would not fill. He feels that condition is Unchanged.  Wt Readings from Last 3 Encounters:  11/05/20 207 lb (93.9 kg)  10/23/20 210 lb (95.3 kg)  10/14/20 210 lb (95.3 kg)    -----------------------------------------------------------------------------------------   Patient Active Problem List   Diagnosis Date Noted  . Personal history of colonic polyps   . Chest pain 12/30/2018  . Breathlessness on exertion 06/05/2015  . Dizziness 05/08/2015  . Arteriosclerosis of coronary artery 05/07/2015  . HLD (hyperlipidemia) 05/07/2015  . BP (high blood pressure) 05/07/2015  . Obesity 05/07/2015  . Depression 05/07/2015  . GAD (generalized anxiety disorder) 05/07/2015  . Insomnia 05/07/2015  . GERD (gastroesophageal reflux disease) 05/07/2015  . Gout 05/07/2015  . Cervical radiculopathy 05/07/2015  . Prediabetes 05/07/2015  . History of kidney stones 05/07/2015  . Benign essential HTN 05/05/2015  . Anxiety 04/30/2015  . Borderline diabetes 04/30/2015  . ST elevation (STEMI) myocardial infarction ([place]) 04/30/2015  . Blood glucose elevated 04/30/2015  . CHEST PAIN UNSPECIFIED 06/19/2009   Social  History   Tobacco Use  . Smoking status: Former Smoker    Packs/day: 1.50    Years: 15.00    Pack years: 22.50    Types: Cigarettes    Quit date: 10/18/2002    Years since quitting: 18.0  . Smokeless tobacco: Current User    Types: Chew  . Tobacco comment: Dips  Vaping Use  . Vaping Use: Never  used  Substance Use Topics  . Alcohol use: Yes    Alcohol/week: 7.0 - 14.0 standard drinks    Types: 7 - 14 Cans of beer per week    Comment: 1-2 "lite" beers per day  . Drug use: No   Allergies  Allergen Reactions  . Codeine Other (See Comments)    Cold sweats (30 years ago) but has taken since with no reaction       Medications: Outpatient Medications Prior to Visit  Medication Sig  . aspirin EC 81 MG tablet Take 81 mg by mouth daily. Swallow whole.  Marland Kitchen atorvastatin (LIPITOR) 80 MG tablet Take by mouth.  . losartan (COZAAR) 50 MG tablet Take 1 tablet (50 mg total) by mouth daily.  . pantoprazole (PROTONIX) 40 MG tablet TAKE 1 TABLET(40 MG) BY MOUTH TWICE DAILY (Patient taking differently: Take 40 mg by mouth daily.)  . PARoxetine (PAXIL) 40 MG tablet TAKE 1 TABLET(40 MG) BY MOUTH DAILY (Patient taking differently: Take 40 mg by mouth daily.)  . triamcinolone cream (KENALOG) 0.1 % Apply 1 application topically daily as needed.  . phentermine 30 MG capsule Take 1 capsule (30 mg total) by mouth every morning. (Patient not taking: No sig reported)   No facility-administered medications prior to visit.    Review of Systems  Constitutional: Negative for appetite change, chills and fever.  Respiratory: Negative for chest tightness, shortness of breath and wheezing.   Cardiovascular: Negative for chest pain and palpitations.  Gastrointestinal: Negative for abdominal pain, nausea and vomiting.    Last CBC Lab Results  Component Value Date   WBC 6.9 10/14/2020   HGB 16.6 10/14/2020   HCT 47.0 10/14/2020   MCV 92.5 10/14/2020   MCH 32.7 10/14/2020   RDW 13.1 10/14/2020   PLT 227 10/14/2020   Last thyroid functions Lab Results  Component Value Date   TSH 1.050 01/29/2020       Objective    BP 133/88 (BP Location: Right Arm, Patient Position: Sitting, Cuff Size: Normal)   Pulse 77   Temp 99.3 F (37.4 C) (Oral)   Resp 16   Ht 5\' 4"  (1.626 m)   Wt 207 lb (93.9 kg)    SpO2 100%   BMI 35.53 kg/m  BP Readings from Last 3 Encounters:  11/05/20 133/88  10/23/20 125/72  07/30/20 130/69   Wt Readings from Last 3 Encounters:  11/05/20 207 lb (93.9 kg)  10/23/20 210 lb (95.3 kg)  10/14/20 210 lb (95.3 kg)       Physical Exam Vitals reviewed.  Constitutional:      Appearance: Normal appearance.  HENT:     Head: Normocephalic and atraumatic.     Right Ear: Tympanic membrane and external ear normal.     Left Ear: Tympanic membrane and external ear normal.     Mouth/Throat:     Mouth: Mucous membranes are moist.     Pharynx: Oropharynx is clear.  Eyes:     General: No scleral icterus.    Conjunctiva/sclera: Conjunctivae normal.  Neck:     Vascular: No carotid  bruit.  Cardiovascular:     Rate and Rhythm: Normal rate and regular rhythm.     Pulses: Normal pulses.     Heart sounds: Normal heart sounds.  Pulmonary:     Effort: Pulmonary effort is normal.     Breath sounds: Normal breath sounds.  Abdominal:     Palpations: Abdomen is soft.  Musculoskeletal:     Right lower leg: No edema.     Left lower leg: No edema.  Lymphadenopathy:     Cervical: No cervical adenopathy.  Skin:    General: Skin is warm and dry.  Neurological:     General: No focal deficit present.     Mental Status: He is alert and oriented to person, place, and time.  Psychiatric:        Mood and Affect: Mood normal.        Behavior: Behavior normal.        Thought Content: Thought content normal.        Judgment: Judgment normal.       No results found for any visits on 11/05/20.  Assessment & Plan     1. Essential hypertension Good control.  2. Borderline diabetes Pneumovax on next visit.  We are out today.  3. Class 2 severe obesity due to excess calories with serious comorbidity and body mass index (BMI) of 36.0 to 36.9 in adult [facility]) Phentermine refilled with 2 months prescription  4. Mixed hyperlipidemia On atorvastatin.   No follow-ups on file.       I, Wilhemena Durie, MD, have reviewed all documentation for this visit. The documentation on 11/11/20 for the exam, diagnosis, procedures, and orders are all accurate and complete.    Kayleb Warshaw Cranford Mon, MD  [facility] [phone] (phone) [phone] (fax)  [place]

## 2020-11-24 ENCOUNTER — Ambulatory Visit: Payer: PRIVATE HEALTH INSURANCE | Admitting: Dermatology

## 2021-01-09 ENCOUNTER — Ambulatory Visit (INDEPENDENT_AMBULATORY_CARE_PROVIDER_SITE_OTHER)
Admission: EM | Admit: 2021-01-09 | Discharge: 2021-01-09 | Disposition: A | Discharge status: Home / Self Care | Payer: Medicare Other | Source: Home / Self Care | Attending: Family Medicine | Admitting: Family Medicine

## 2021-01-09 ENCOUNTER — Ambulatory Visit
Admission: RE | Admit: 2021-01-09 | Discharge: 2021-01-09 | Disposition: A | Discharge status: Home / Self Care | Payer: Medicare Other | Source: Ambulatory Visit | Attending: Family Medicine | Admitting: Family Medicine

## 2021-01-09 ENCOUNTER — Other Ambulatory Visit: Payer: Self-pay

## 2021-01-09 ENCOUNTER — Ambulatory Visit: Payer: Self-pay | Admitting: *Deleted

## 2021-01-09 DIAGNOSIS — I700 Atherosclerosis of aorta: Secondary | ICD-10-CM | POA: Diagnosis not present

## 2021-01-09 DIAGNOSIS — N200 Calculus of kidney: Secondary | ICD-10-CM | POA: Diagnosis not present

## 2021-01-09 DIAGNOSIS — M48061 Spinal stenosis, lumbar region without neurogenic claudication: Secondary | ICD-10-CM | POA: Diagnosis not present

## 2021-01-09 DIAGNOSIS — M4316 Spondylolisthesis, lumbar region: Secondary | ICD-10-CM | POA: Insufficient documentation

## 2021-01-09 DIAGNOSIS — I2510 Atherosclerotic heart disease of native coronary artery without angina pectoris: Secondary | ICD-10-CM | POA: Diagnosis not present

## 2021-01-09 DIAGNOSIS — M47816 Spondylosis without myelopathy or radiculopathy, lumbar region: Secondary | ICD-10-CM | POA: Diagnosis not present

## 2021-01-09 DIAGNOSIS — R310 Gross hematuria: Secondary | ICD-10-CM | POA: Insufficient documentation

## 2021-01-09 LAB — URINALYSIS, COMPLETE (UACMP) WITH MICROSCOPIC
Bacteria, UA: NONE SEEN
Bilirubin Urine: NEGATIVE
Glucose, UA: NEGATIVE mg/dL
Ketones, ur: NEGATIVE mg/dL
Leukocytes,Ua: NEGATIVE
Nitrite: NEGATIVE
Protein, ur: 30 mg/dL — AB
RBC / HPF: >50 RBC/hpf (ref 0–5)
Specific Gravity, Urine: >1.03 — ABNORMAL HIGH (ref 1.005–1.030)
Squamous Epithelial / HPF: NONE SEEN (ref 0–5)
WBC, UA: NONE SEEN WBC/hpf (ref 0–5)
pH: 5.5 (ref 5.0–8.0)

## 2021-01-09 MED ORDER — HYDROCODONE-ACETAMINOPHEN 5-325 MG PO TABS: 1.0000 | ORAL_TABLET | Freq: Three times a day (TID) | ORAL | 0 refills | Status: DC | PRN

## 2021-01-09 MED ORDER — TAMSULOSIN HCL 0.4 MG PO CAPS: 0.4000 mg | ORAL_CAPSULE | Freq: Every day | ORAL | 0 refills | Status: DC

## 2021-01-09 NOTE — Discharge Instructions (Signed)
I will call with the results of the CT.  Go to [facility] as directed.  Take care  Dr. Lacinda Axon

## 2021-01-09 NOTE — Telephone Encounter (Signed)
It was recommended to the patient that he go to the ER, but he keeps declining. No appts available today. What do you recommend? Please advise. Thanks!

## 2021-01-09 NOTE — ED Provider Notes (Signed)
[facility]    CSN: [id number] Arrival date & time: 01/09/21  1603      History   Chief Complaint Hematuria  HPI   70 year old male presents with the above complaint.  Patient reports that he has had some back pain/flank pain on the right side.  He woke up this morning and noted dark urine consistent with hematuria.  He reports a remote history of kidney stone.  He denies any urinary symptoms.  No nausea, vomiting.  No fever.  His pain is 2/10 in severity.  No relieving factors.  No other reported symptoms.  No other complaints.  Past Medical History:  Diagnosis Date  . Arthritis   . ASCVD (arteriosclerotic cardiovascular disease)   . Cervical radiculopathy   . Chest pain, unspecified   . Coronary artery disease   . Depression   . Dysplastic nevus    left ear - treated by Dr. Sharlett Iles  . GAD (generalized anxiety disorder)   . GAD (generalized anxiety disorder)   . GERD (gastroesophageal reflux disease)   . Gout   . HLD (hyperlipidemia)   . Hypertension   . Kidney stone   . Obesity   . Pre-diabetes    borderline. patient unaware of this,  . Reflux   . Sleep apnea    CAN NOT USE HIS CPAP  . STEMI (ST elevation myocardial infarction) ([place]) 2016   2 STENTS  . Vertigo    none recently    Patient Active Problem List   Diagnosis Date Noted  . Personal history of colonic polyps   . Chest pain 12/30/2018  . Breathlessness on exertion 06/05/2015  . Dizziness 05/08/2015  . Arteriosclerosis of coronary artery 05/07/2015  . HLD (hyperlipidemia) 05/07/2015  . BP (high blood pressure) 05/07/2015  . Obesity 05/07/2015  . Depression 05/07/2015  . GAD (generalized anxiety disorder) 05/07/2015  . Insomnia 05/07/2015  . GERD (gastroesophageal reflux disease) 05/07/2015  . Gout 05/07/2015  . Cervical radiculopathy 05/07/2015  . Prediabetes 05/07/2015  . History of kidney stones 05/07/2015  . Benign essential HTN 05/05/2015  . Anxiety 04/30/2015  .  Borderline diabetes 04/30/2015  . ST elevation (STEMI) myocardial infarction ([place]) 04/30/2015  . Blood glucose elevated 04/30/2015  . CHEST PAIN UNSPECIFIED 06/19/2009    Past Surgical History:  Procedure Laterality Date  . ANTERIOR CRUCIATE LIGAMENT REPAIR Right 1993, 2005   1993  . APPENDECTOMY    . CARDIAC CATHETERIZATION  2016   stent  . CHOLECYSTECTOMY    . COLONOSCOPY WITH PROPOFOL N/A 12/31/2019   Procedure: COLONOSCOPY WITH PROPOFOL;  Surgeon: [name], MD;  Location: [facility] [facility];  Service: Endoscopy;  Laterality: N/A;  . CORONARY STENT PLACEMENT    . EYE SURGERY Left   . SHOULDER ARTHROSCOPY WITH ROTATOR CUFF REPAIR AND SUBACROMIAL DECOMPRESSION Right 10/23/2020   Procedure: RIGHT SHOULDER ARTHROSCOPY WITH DEB RIDEMENT;  Surgeon: Thornton Park, MD;  Location: [facility];  Service: Orthopedics;  Laterality: Right;  . TONSILLECTOMY    . UPPER GI ENDOSCOPY         Home Medications    Prior to Admission medications   Medication Sig Start Date End Date Taking? Authorizing Provider  aspirin EC 81 MG tablet Take 81 mg by mouth daily. Swallow whole.   Yes [provider]  atorvastatin (LIPITOR) 80 MG tablet Take by mouth. 08/27/19  Yes [provider]  HYDROcodone-acetaminophen (NORCO/VICODIN) 5-325 MG tablet Take 1 tablet by mouth every 8 (eight) hours as needed for moderate  pain or severe pain. 01/09/21  Yes Zyanne Schumm G, DO  losartan (COZAAR) 50 MG tablet Take 1 tablet (50 mg total) by mouth daily. 06/26/18  Yes Jerrol Banana., MD  pantoprazole (PROTONIX) 40 MG tablet TAKE 1 TABLET(40 MG) BY MOUTH TWICE DAILY Patient taking differently: Take 40 mg by mouth daily. 02/19/20  Yes Jerrol Banana., MD  PARoxetine (PAXIL) 40 MG tablet TAKE 1 TABLET(40 MG) BY MOUTH DAILY Patient taking differently: Take 40 mg by mouth daily. 08/13/20  Yes Jerrol Banana., MD  phentermine 30 MG capsule Take 1 capsule (30 mg total) by mouth every morning.  11/05/20  Yes Jerrol Banana., MD  tamsulosin (FLOMAX) 0.4 MG CAPS capsule Take 1 capsule (0.4 mg total) by mouth daily. 01/09/21  Yes Terren Haberle G, DO  triamcinolone cream (KENALOG) 0.1 % Apply 1 application topically daily as needed. 08/11/20  Yes Ralene Bathe, MD    Family History Family History  Problem Relation Age of Onset  . Cancer Mother        lung  . Stroke Mother   . Multiple sclerosis Mother   . Heart attack Father   . Bladder Cancer Maternal Grandfather   . Heart disease Maternal Grandfather   . Prostate cancer Neg Hx   . Kidney disease Neg Hx     Social History Social History   Tobacco Use  . Smoking status: Former Smoker    Packs/day: 1.50    Years: 15.00    Pack years: 22.50    Types: Cigarettes    Quit date: 10/18/2002    Years since quitting: 18.2  . Smokeless tobacco: Current User    Types: Chew  . Tobacco comment: Dips  Vaping Use  . Vaping Use: Never used  Substance Use Topics  . Alcohol use: Yes    Alcohol/week: 7.0 - 14.0 standard drinks    Types: 7 - 14 Cans of beer per week    Comment: 1-2 "lite" beers per day  . Drug use: No     Allergies   Codeine   Review of Systems Review of Systems  Genitourinary: Positive for flank pain and hematuria.  Musculoskeletal: Positive for back pain.   Physical Exam Triage Vital Signs ED Triage Vitals  Enc Vitals Group     BP 01/09/21 1629 (!) 174/92     Pulse Rate 01/09/21 1629 83     Resp 01/09/21 1629 18     Temp 01/09/21 1629 99.2 F (37.3 C)     Temp Source 01/09/21 1629 Oral     SpO2 01/09/21 1629 100 %     Weight 01/09/21 1627 210 lb (95.3 kg)     Height 01/09/21 1627 5\' 5"  (1.651 m)     Head Circumference --      Peak Flow --      Pain Score 01/09/21 1626 2     Pain Loc --      Pain Edu? --      Excl. in [place]? --    Updated Vital Signs BP (!) 174/92 (BP Location: Left Arm)   Pulse 83   Temp 99.2 F (37.3 C) (Oral)   Resp 18   Ht 5\' 5"  (1.651 m)   Wt 95.3 kg   SpO2  100%   BMI 34.95 kg/m   Visual Acuity Right Eye Distance:   Left Eye Distance:   Bilateral Distance:    Right Eye Near:   Left Eye Near:  Bilateral Near:     Physical Exam Constitutional:      General: He is not in acute distress.    Appearance: Normal appearance. He is not ill-appearing.  HENT:     Head: Normocephalic and atraumatic.  Eyes:     General:        Right eye: No discharge.        Left eye: No discharge.     Conjunctiva/sclera: Conjunctivae normal.  Cardiovascular:     Rate and Rhythm: Normal rate and regular rhythm.  Pulmonary:     Effort: Pulmonary effort is normal.     Breath sounds: Normal breath sounds. No wheezing, rhonchi or rales.  Abdominal:     Tenderness: There is no right CVA tenderness or left CVA tenderness.  Genitourinary:    Comments: Mild erythema of the head of the penis. Neurological:     Mental Status: He is alert.  Psychiatric:        Mood and Affect: Mood normal.        Behavior: Behavior normal.    UC Treatments / Results  Labs (all labs ordered are listed, but only abnormal results are displayed) Labs Reviewed  URINALYSIS, COMPLETE (UACMP) WITH MICROSCOPIC - Abnormal; Notable for the following components:      Result Value   APPearance CLOUDY (*)    Specific Gravity, Urine >1.030 (*)    Hgb urine dipstick LARGE (*)    Protein, ur 30 (*)    All other components within normal limits    EKG   Radiology CT ABDOMEN PELVIS WO CONTRAST  Result Date: 01/09/2021 CLINICAL DATA:  Flank pain and hematuria. Symptoms on the right. History of kidney stones. EXAM: CT ABDOMEN AND PELVIS WITHOUT CONTRAST TECHNIQUE: Multidetector CT imaging of the abdomen and pelvis was performed following the standard protocol without IV contrast. COMPARISON:  None. FINDINGS: Lower chest: Coronary artery calcification is present. Lung bases are clear. Hepatobiliary: Previous cholecystectomy. Liver parenchyma appears normal. Pancreas: Normal. 2 benign  appearing calcifications in the pancreatic head. Spleen: Normal Adrenals/Urinary Tract: Adrenal glands are normal. Left kidney is normal. Right kidney shows no parenchymal lesion. There is a 5 mm stone in the right renal pelvis. This could cause intermittent ball valve obstruction. No abnormality seen in either ureter distal to that. The bladder is normal. Stomach/Bowel: Normal. No sign of mass, inflammatory disease or obstruction. Vascular/Lymphatic: Aortic atherosclerosis. No aneurysm. IVC is normal. No adenopathy. Reproductive: High-riding testicles within the spermatic cord regions. Other: No free fluid or air. Musculoskeletal: Chronic lower lumbar degenerative changes including degenerative anterolisthesis at L4-5 about 5 mm, with potential for spinal stenosis at that level. IMPRESSION: 1. 5 mm stone in the right renal pelvis. No measurable hydronephrosis presently. This could cause intermittent ball valve obstruction. 2. Aortic atherosclerosis. Coronary artery calcification. 3. Chronic lower lumbar degenerative changes including degenerative anterolisthesis at L4-5 about 5 mm, with potential for spinal stenosis at that level. 4. These results will be called to the ordering clinician or representative by the Radiologist Assistant, and communication documented in the PACS or [employer]. Aortic Atherosclerosis (ICD10-I70.0). Electronically Signed   By: Nelson Chimes M.D.   On: 01/09/2021 18:14    Procedures Procedures (including critical care time)  Medications Ordered in UC Medications - No data to display  Initial Impression / Assessment and Plan / UC Course  I have reviewed the triage vital signs and the nursing notes.  Pertinent labs & imaging results that were available during my care of  the patient were reviewed by me and considered in my medical decision making (see chart for details).    70 year old male presents with right\flank pain and gross hematuria.  Hematuria noted on urinalysis  and microscopy.  Patient was sent to the hospital for CT abdomen pelvis without contrast.  CT scan was independent reviewed by me.  Interpretation: 5 mm stone noted at the right renal pelvis.  No hydronephrosis noted.  Patient was given a urine strainer.  Advised to increase fluid intake with lots of water.  Starting on Flomax.  As needed Vicodin as needed for pain.  Referral placed to urology as he may need intervention.  **Of note, microscopy of his urine revealed yeast as well.  I believe that this is related to mild balanitis associated with hygiene.  No need for further intervention at this time.  Final Clinical Impressions(s) / UC Diagnoses   Final diagnoses:  Gross hematuria  Nephrolithiasis     Discharge Instructions     I will call with the results of the CT.  Go to [facility] as directed.  Take care  Dr. Lacinda Axon     ED Prescriptions    Medication Sig Dispense Auth. Provider   tamsulosin (FLOMAX) 0.4 MG CAPS capsule Take 1 capsule (0.4 mg total) by mouth daily. 30 capsule Kingdom Vanzanten G, DO   HYDROcodone-acetaminophen (NORCO/VICODIN) 5-325 MG tablet Take 1 tablet by mouth every 8 (eight) hours as needed for moderate pain or severe pain. 10 tablet Thersa Salt G, DO     I have reviewed the PDMP during this encounter.   Coral Spikes, [place] 01/09/21 1901

## 2021-01-09 NOTE — ED Triage Notes (Signed)
Pt c/o discolored urine and lower back pain since yesterday. Pt reports hx of kidney stones. Pt denies pain/burning with urination. Pt denies f/n/v/d.

## 2021-01-09 NOTE — Telephone Encounter (Signed)
Pt called in c/o right mid back pain beneath his ribs and urinating milky chocolate colored urine 15 minutes ago.   He is also having urgency and frequency.   Has a history of kidney stones and "this seems to be acting like a kidney stone".  The back pain started 2 days ago.  "I do not want to go to the ED and sit for hours".   "Please can I see a dr. There".  I let him know there were no appts available but I would see if he could be worked in with any of the providers and have someone call him back.  He was agreeable to this plan.  I called into [facility] after getting off the phone with the pt.   They are going to check with Dr. Caryn Section for his recommendation (Dr. Rosanna Randy is not in today) but he will probably need to go on to the ED.    They will call the pt back.  I sent my triage notes to the office high priority.     Reason for Disposition . Side (flank) or back pain present  Answer Assessment - Initial Assessment Questions 1. COLOR of URINE: "Describe the color of the urine."  (e.g., tea-colored, pink, red, blood clots, bloody)     I'm having right sided back pain.   I have to urinate quick.   My urine looks dark like milk.   I've had 8 kidney stones.     2. ONSET: "When did the bleeding start?"      A couple of days ago the back pain started.   The urine change was just 15 minutes ago.   My urine is looking like milk chocolate. 3. EPISODES: "How many times has there been blood in the urine?" or "How many times today?"     Just once 4. PAIN with URINATION: "Is there any pain with passing your urine?" If Yes, ask: "How bad is the pain?"  (Scale 1-10; or mild, moderate, severe)    - MILD - complains slightly about urination hurting    - MODERATE - interferes with normal activities      - SEVERE - excruciating, unwilling or unable to urinate because of the pain      No burning.     A week ago I had some burning without urination.   5. FEVER: "Do you have a fever?" If  Yes, ask: "What is your temperature, how was it measured, and when did it start?"     No 6. ASSOCIATED SYMPTOMS: "Are you passing urine more frequently than usual?"     I'm going more frequently and in a hurry. 7. OTHER SYMPTOMS: "Do you have any other symptoms?" (e.g., back/flank pain, abdominal pain, vomiting)     Right mid back pain 8. PREGNANCY: "Is there any chance you are pregnant?" "When was your last menstrual period?"     N/A  Protocols used: URINE - BLOOD IN-A-AH

## 2021-01-09 NOTE — Telephone Encounter (Signed)
Needs to go to ER. Could be kidney stone, could be ruptured renal cyst or even an aneurysm. Needs to go to ER

## 2021-01-12 ENCOUNTER — Other Ambulatory Visit: Payer: Self-pay | Admitting: *Deleted

## 2021-01-12 DIAGNOSIS — R310 Gross hematuria: Secondary | ICD-10-CM

## 2021-01-12 NOTE — Telephone Encounter (Signed)
Spoke to patient when I got this message.  Patient was upset that no one called him back after he made 2 calls to office and was told that his message was marked high priority.  They told him Dr Caryn Section was in a room and they would have  someone call him back when he got out.  At about 4:30 when no one had called him back he went to the urgent care and they had CT scan done.  Patient has been referred to Urology for a 5 mm renal stone.

## 2021-01-13 ENCOUNTER — Ambulatory Visit
Admission: RE | Admit: 2021-01-13 | Discharge: 2021-01-13 | Disposition: A | Discharge status: Home / Self Care | Payer: Medicare Other | Attending: Urology | Admitting: Urology

## 2021-01-13 ENCOUNTER — Other Ambulatory Visit: Payer: Self-pay

## 2021-01-13 ENCOUNTER — Ambulatory Visit
Admission: RE | Admit: 2021-01-13 | Discharge: 2021-01-13 | Disposition: A | Discharge status: Home / Self Care | Payer: Medicare Other | Source: Ambulatory Visit | Attending: Urology | Admitting: Urology

## 2021-01-13 ENCOUNTER — Encounter: Payer: Self-pay | Admitting: Urology

## 2021-01-13 ENCOUNTER — Ambulatory Visit (INDEPENDENT_AMBULATORY_CARE_PROVIDER_SITE_OTHER): Payer: Medicare Other | Admitting: Urology

## 2021-01-13 VITALS — BP 150/100 | HR 66 | Ht 65.5 in | Wt 203.0 lb

## 2021-01-13 DIAGNOSIS — R310 Gross hematuria: Secondary | ICD-10-CM

## 2021-01-13 DIAGNOSIS — N201 Calculus of ureter: Secondary | ICD-10-CM | POA: Diagnosis not present

## 2021-01-13 DIAGNOSIS — N200 Calculus of kidney: Secondary | ICD-10-CM | POA: Diagnosis not present

## 2021-01-13 DIAGNOSIS — R319 Hematuria, unspecified: Secondary | ICD-10-CM | POA: Diagnosis not present

## 2021-01-13 NOTE — Progress Notes (Signed)
01/13/21 11:31 AM   Michael Leon 21-Jul-1951 [id number]  CC: Gross hematuria, right renal stone  HPI: I saw Mr. Googe today for the above issues.  He is a 70 year old male with history of CAD who had acute onset of some right lower back pain and gross hematuria and presented to the ED on 01/09/2021.  Urinalysis at that time showed greater than 50 RBCs, 0 WBCs, no leukocytes, nitrite negative, and budding yeast were present.  CT showed a 5 mm stone just above the right UPJ that likely was causing intermittent obstruction.  He reports 7-8 prior stone episodes, but has never required surgery for stones.  He denies any dysuria or urinary symptoms.  Denies any fevers or chills.  KUB today shows unchanged location of right UPJ stone.   PMH: Past Medical History:  Diagnosis Date  . Arthritis   . ASCVD (arteriosclerotic cardiovascular disease)   . Cervical radiculopathy   . Chest pain, unspecified   . Coronary artery disease   . Depression   . Dysplastic nevus    left ear - treated by Dr. Sharlett Iles  . GAD (generalized anxiety disorder)   . GAD (generalized anxiety disorder)   . GERD (gastroesophageal reflux disease)   . Gout   . HLD (hyperlipidemia)   . Hypertension   . Kidney stone   . Obesity   . Pre-diabetes    borderline. patient unaware of this,  . Reflux   . Sleep apnea    CAN NOT USE HIS CPAP  . STEMI (ST elevation myocardial infarction) ([place]) 2016   2 STENTS  . Vertigo    none recently    Surgical History: Past Surgical History:  Procedure Laterality Date  . ANTERIOR CRUCIATE LIGAMENT REPAIR Right 1993, 2005   1993  . APPENDECTOMY    . CARDIAC CATHETERIZATION  2016   stent  . CHOLECYSTECTOMY    . COLONOSCOPY WITH PROPOFOL N/A 12/31/2019   Procedure: COLONOSCOPY WITH PROPOFOL;  Surgeon: [name], MD;  Location: [facility];  Service: Endoscopy;  Laterality: N/A;  . CORONARY STENT PLACEMENT    . EYE SURGERY Left   . SHOULDER ARTHROSCOPY WITH  ROTATOR CUFF REPAIR AND SUBACROMIAL DECOMPRESSION Right 10/23/2020   Procedure: RIGHT SHOULDER ARTHROSCOPY WITH DEB RIDEMENT;  Surgeon: Thornton Park, MD;  Location: [facility];  Service: Orthopedics;  Laterality: Right;  . TONSILLECTOMY    . UPPER GI ENDOSCOPY      Family History: Family History  Problem Relation Age of Onset  . Cancer Mother        lung  . Stroke Mother   . Multiple sclerosis Mother   . Heart attack Father   . Bladder Cancer Maternal Grandfather   . Heart disease Maternal Grandfather   . Prostate cancer Neg Hx   . Kidney disease Neg Hx     Social History:  reports that he quit smoking about 18 years ago. His smoking use included cigarettes. He has a 22.50 pack-year smoking history. His smokeless tobacco use includes chew. He reports current alcohol use of about 7.0 - 14.0 standard drinks of alcohol per week. He reports that he does not use drugs.  Physical Exam: BP (!) 150/100   Pulse 66   Ht 5' 5.5" (1.664 m)   Wt 203 lb (92.1 kg)   BMI 33.27 kg/m    Constitutional:  Alert and oriented, No acute distress. Cardiovascular: No clubbing, cyanosis, or edema. Respiratory: Normal respiratory effort, no increased work of breathing. GI:  Abdomen is soft, nontender, nondistended, no abdominal masses  Laboratory Data: Reviewed, see HPI  Pertinent Imaging: I have personally viewed and interpreted the CT dated 01/09/2021 that shows a 5 mm stone just above the right UPJ, 580HU, 13cm SSD.  Stone clearly seen on KUB today at right UPJ.  Assessment & Plan:   70 year old male with 5 mm right UPJ stone causing intermittent obstruction with pain and gross hematuria.  No clinical or laboratory evidence of infection.  We discussed various treatment options for urolithiasis including observation with or without medical expulsive therapy, shockwave lithotripsy (SWL), ureteroscopy and laser lithotripsy with stent placement, and percutaneous nephrolithotomy.  We discussed that  management is based on stone size, location, density, patient co-morbidities, and patient preference.   Stones <62mm in size have a >80% spontaneous passage rate. Data surrounding the use of tamsulosin for medical expulsive therapy is controversial, but meta analyses suggests it is most efficacious for distal stones between 5-56mm in size. Possible side effects include dizziness/lightheadedness, and retrograde ejaculation.  SWL has a lower stone free rate in a single procedure, but also a lower complication rate compared to ureteroscopy and avoids a stent and associated stent related symptoms. Possible complications include renal hematoma, steinstrasse, and need for additional treatment.  Ureteroscopy with laser lithotripsy and stent placement has a higher stone free rate than SWL in a single procedure, however increased complication rate including possible infection, ureteral injury, bleeding, and stent related morbidity. Common stent related symptoms include dysuria, urgency/frequency, and flank pain.  After an extensive discussion of the risks and benefits of the above treatment options, the patient would like to proceed with right shockwave lithotripsy.  1 dose of fluconazole for yeast in urine, but low suspicion for infection based on symptoms and urinalysis.  Nickolas Madrid, MD 01/13/2021  [facility] [address], [place], [place] [address] [phone]

## 2021-01-13 NOTE — Patient Instructions (Signed)
[facility] (25th ed., pp. [phone]). [place], [place]: [employer], [employer]. Retrieved from https://www.clinicalkey.com/#!/content/book/3-[email or web address]-[email or web address]?scrollTo=%[email or web address]">  Lithotripsy  Lithotripsy is a treatment that can help break up kidney stones that are too large to pass on their own. This is a nonsurgical procedure that crushes a kidney stone with shock waves. These shock waves pass through your body and focus on the kidney stone. They cause the kidney stone to break up into smaller pieces while it is still in the urinary tract. The smaller pieces of stone can pass more easily out of your body in the urine. Tell a health care provider about:  Any allergies you have.  All medicines you are taking, including vitamins, herbs, eye drops, creams, and over-the-counter medicines.  Any problems you or family members have had with anesthetic medicines.  Any blood disorders you have.  Any surgeries you have had.  Any medical conditions you have.  Whether you are pregnant or may be pregnant. What are the risks? Generally, this is a safe procedure. However, problems may occur, including:  Infection.  Bleeding from the kidney.  Bruising of the kidney or skin.  Scarring of the kidney, which can lead to: ? Increased blood pressure. ? Poor kidney function. ? Return (recurrence) of kidney stones.  Damage to other structures or organs, such as the liver, colon, spleen, or pancreas.  Blockage (obstruction) of the tube that carries urine from the kidney to the bladder (ureter).  Failure of the kidney stone to break into pieces (fragments). What happens before the procedure? Staying hydrated Follow instructions from your health care provider about hydration, which may include:  Up to 2 hours before the procedure - you may continue to drink clear liquids, such as water, clear fruit juice, black coffee, and plain tea. Eating and drinking restrictions Follow  instructions from your health care provider about eating and drinking, which may include:  8 hours before the procedure - stop eating heavy meals or foods, such as meat, fried foods, or fatty foods.  6 hours before the procedure - stop eating light meals or foods, such as toast or cereal.  6 hours before the procedure - stop drinking milk or drinks that contain milk.  2 hours before the procedure - stop drinking clear liquids. Medicines Ask your health care provider about:  Changing or stopping your regular medicines. This is especially important if you are taking diabetes medicines or blood thinners.  Taking medicines such as aspirin and ibuprofen. These medicines can thin your blood. Do not take these medicines unless your health care provider tells you to take them.  Taking over-the-counter medicines, vitamins, herbs, and supplements. Tests You may have tests, such as:  Blood tests.  Urine tests.  Imaging tests, such as a CT scan. General instructions  Plan to have someone take you home from the hospital or clinic.  If you will be going home right after the procedure, plan to have someone with you for 24 hours.  Ask your health care provider what steps will be taken to help prevent infection. These may include washing skin with a germ-killing soap. What happens during the procedure?  An IV will be inserted into one of your veins.  You will be given one or more of the following: ? A medicine to help you relax (sedative). ? A medicine to make you fall asleep (general anesthetic).  A water-filled cushion may be placed behind your kidney or on your abdomen. In some cases, you may be placed in a tub of  lukewarm water.  Your body will be positioned in a way that makes it easy to target the kidney stone.  An X-ray or ultrasound exam will be done to locate your stone.  Shock waves will be aimed at the stone. If you are awake, you may feel a tapping sensation as the shock  waves pass through your body.  A flexible tube with holes in it (stent) may be placed in the ureter. This will help keep urine flowing from the kidney if the fragments of the stone have been blocking the ureter. The procedure may vary among health care providers and hospitals.   What happens after the procedure?  You may have an X-ray to see whether the procedure was able to break up the kidney stone and how much of the stone has passed. If large stone fragments remain after treatment, you may need to have a second procedure at a later time.  Your blood pressure, heart rate, breathing rate, and blood oxygen level will be monitored until you leave the hospital or clinic.  You may be given antibiotics or pain medicine as needed.  If a stent was placed in your ureter during surgery, it may stay in place for a few weeks.  You may need to strain your urine to collect pieces of the kidney stone for testing.  You will need to drink plenty of water.  If you were given a sedative during the procedure, it can affect you for several hours. Do not drive or operate machinery until your health care provider says that it is safe. Summary  Lithotripsy is a treatment that can help break up kidney stones that are too large to pass on their own.  Lithotripsy is a nonsurgical procedure that crushes a kidney stone with shock waves.  Generally, this is a safe procedure. However, problems may occur, including damage to the kidney or other organs, infection, or obstruction of the tube that carries urine from the kidney to the bladder (ureter).  You may have a stent placed in your ureter to help drain your urine. This stent may stay in place for a few weeks.  After the procedure, you will need to drink plenty of water. You may be asked to strain your urine to collect pieces of the kidney stone for testing. This information is not intended to replace advice given to you by your health care provider. Make sure  you discuss any questions you have with your health care provider. Document Revised: 07/18/2019 Document Reviewed: 07/18/2019 Elsevier Patient Education  2021 [place] After This sheet gives you information about how to care for yourself after your procedure. Your health care provider may also give you more specific instructions. If you have problems or questions, contact your health care provider. What can I expect after the procedure? After the procedure, it is common to have:  Some blood in your urine. This should only last for a few days.  Soreness in your back, sides, or upper abdomen for a few days.  Blotches or bruises on the area where the shock wave entered the skin.  Pain, discomfort, or nausea when pieces (fragments) of the kidney stone move through the tube that carries urine from the kidney to the bladder (ureter). Stone fragments may pass soon after the procedure, but they may continue to pass for up to 4-8 weeks. ? If you have severe pain or nausea, contact your health care provider. This may be caused by a large  stone that was not broken up, and this may mean that you need more treatment.  Some pain or discomfort during urination.  Some pain or discomfort in the lower abdomen or (in men) at the base of the penis. Follow these instructions at home: Medicines  Take over-the-counter and prescription medicines only as told by your health care provider.  If you were prescribed an antibiotic medicine, take it as told by your health care provider. Do not stop taking the antibiotic even if you start to feel better.  Ask your health care provider if the medicine prescribed to you requires you to avoid driving or using machinery. Eating and drinking  Drink enough fluid to keep your urine pale yellow. This helps any remaining pieces of the stone to pass. It can also help prevent new stones from forming.  Eat plenty of fresh fruits and  vegetables.  Follow instructions from your health care provider about eating or drinking restrictions. You may be instructed to: ? Reduce how much salt (sodium) you eat or drink. Check ingredients and nutrition facts on packaged foods and beverages to see how much sodium they contain. ? Reduce how much meat you eat.  Eat the recommended amount of calcium for your age and gender. Ask your health care provider how much calcium you should have.      General instructions  Get plenty of rest.  Return to your normal activities as told by your health care provider. Ask your health care provider what activities are safe for you. Most people can resume normal activities 1-2 days after the procedure.  If you were given a sedative during the procedure, it can affect you for several hours. Do not drive or operate machinery until your health care provider says that it is safe.  Your health care provider may direct you to lie in a certain position (postural drainage) and tap firmly (percuss) over your kidney area to help stone fragments pass. Follow instructions as told by your health care provider.  If directed, strain all urine through the strainer that was provided by your health care provider. ? Keep all fragments for your health care provider to see. Any stones that are found may be sent to a medical lab for examination. The stone may be as small as a grain of salt.  Keep all follow-up visits as told by your health care provider. This is important. Contact a health care provider if:  You have a fever or chills.  You have nausea that is severe or does not go away.  You have any of these urinary symptoms: ? Blood in your urine for longer than your health care provider told you to expect. ? Urine that smells bad or unusual. ? Feeling a strong urge to urinate after emptying your bladder. ? Pain or burning with urination that does not go away. ? Urinating more often than usual and this does not  go away.  You have a stent and it comes out. Get help right away if:  You have severe pain in your back, sides, or upper abdomen.  You have any of these urinary symptoms: ? Severe pain while urinating. ? More blood in your urine or having blood in your urine when you did not before. ? Passing blood clots in your urine. ? Passing only a small amount of urine or being unable to pass any urine at all.  You have severe nausea that leads to persistent vomiting.  You faint. Summary  After this  procedure, it is common to have some pain, discomfort, or nausea when pieces (fragments) of the kidney stone move through the tube that carries urine from the kidney to the bladder (ureter). If this pain or nausea is severe, however, you should contact your health care provider.  Return to your normal activities as told by your health care provider. Ask your health care provider what activities are safe for you.  Drink enough fluid to keep your urine pale yellow. This helps any remaining pieces of the stone to pass, and it can help prevent new stones from forming.  If directed, strain your urine and keep all fragments for your health care provider to see. Fragments or stones may be as small as a grain of salt.  Get help right away if you have severe pain in your back, sides, or upper abdomen, or if you have severe pain while urinating. This information is not intended to replace advice given to you by your health care provider. Make sure you discuss any questions you have with your health care provider. Document Revised: 07/18/2019 Document Reviewed: 07/18/2019 Elsevier Patient Education  [place].

## 2021-01-13 NOTE — Addendum Note (Signed)
Addended by: Despina Hidden on: 01/13/2021 08:36 AM   Modules accepted: Orders

## 2021-01-13 NOTE — H&P (View-Only) (Signed)
01/13/21 11:31 AM   Michael Leon 1950/11/13 [id number]  CC: Gross hematuria, right renal stone  HPI: I saw Michael Leon today for the above issues.  He is a 70 year old male with history of CAD who had acute onset of some right lower back pain and gross hematuria and presented to the ED on 01/09/2021.  Urinalysis at that time showed greater than 50 RBCs, 0 WBCs, no leukocytes, nitrite negative, and budding yeast were present.  CT showed a 5 mm stone just above the right UPJ that likely was causing intermittent obstruction.  He reports 7-8 prior stone episodes, but has never required surgery for stones.  He denies any dysuria or urinary symptoms.  Denies any fevers or chills.  KUB today shows unchanged location of right UPJ stone.   PMH: Past Medical History:  Diagnosis Date  . Arthritis   . ASCVD (arteriosclerotic cardiovascular disease)   . Cervical radiculopathy   . Chest pain, unspecified   . Coronary artery disease   . Depression   . Dysplastic nevus    left ear - treated by Dr. Sharlett Iles  . GAD (generalized anxiety disorder)   . GAD (generalized anxiety disorder)   . GERD (gastroesophageal reflux disease)   . Gout   . HLD (hyperlipidemia)   . Hypertension   . Kidney stone   . Obesity   . Pre-diabetes    borderline. patient unaware of this,  . Reflux   . Sleep apnea    CAN NOT USE HIS CPAP  . STEMI (ST elevation myocardial infarction) ([place]) 2016   2 STENTS  . Vertigo    none recently    Surgical History: Past Surgical History:  Procedure Laterality Date  . ANTERIOR CRUCIATE LIGAMENT REPAIR Right 1993, 2005   1993  . APPENDECTOMY    . CARDIAC CATHETERIZATION  2016   stent  . CHOLECYSTECTOMY    . COLONOSCOPY WITH PROPOFOL N/A 12/31/2019   Procedure: COLONOSCOPY WITH PROPOFOL;  Surgeon: [name], MD;  Location: [facility];  Service: Endoscopy;  Laterality: N/A;  . CORONARY STENT PLACEMENT    . EYE SURGERY Left   . SHOULDER ARTHROSCOPY WITH  ROTATOR CUFF REPAIR AND SUBACROMIAL DECOMPRESSION Right 10/23/2020   Procedure: RIGHT SHOULDER ARTHROSCOPY WITH DEB RIDEMENT;  Surgeon: Thornton Park, MD;  Location: [facility];  Service: Orthopedics;  Laterality: Right;  . TONSILLECTOMY    . UPPER GI ENDOSCOPY      Family History: Family History  Problem Relation Age of Onset  . Cancer Mother        lung  . Stroke Mother   . Multiple sclerosis Mother   . Heart attack Father   . Bladder Cancer Maternal Grandfather   . Heart disease Maternal Grandfather   . Prostate cancer Neg Hx   . Kidney disease Neg Hx     Social History:  reports that he quit smoking about 18 years ago. His smoking use included cigarettes. He has a 22.50 pack-year smoking history. His smokeless tobacco use includes chew. He reports current alcohol use of about 7.0 - 14.0 standard drinks of alcohol per week. He reports that he does not use drugs.  Physical Exam: BP (!) 150/100   Pulse 66   Ht 5' 5.5" (1.664 m)   Wt 203 lb (92.1 kg)   BMI 33.27 kg/m    Constitutional:  Alert and oriented, No acute distress. Cardiovascular: No clubbing, cyanosis, or edema. Respiratory: Normal respiratory effort, no increased work of breathing. GI:  Abdomen is soft, nontender, nondistended, no abdominal masses  Laboratory Data: Reviewed, see HPI  Pertinent Imaging: I have personally viewed and interpreted the CT dated 01/09/2021 that shows a 5 mm stone just above the right UPJ, 580HU, 13cm SSD.  Stone clearly seen on KUB today at right UPJ.  Assessment & Plan:   70 year old male with 5 mm right UPJ stone causing intermittent obstruction with pain and gross hematuria.  No clinical or laboratory evidence of infection.  We discussed various treatment options for urolithiasis including observation with or without medical expulsive therapy, shockwave lithotripsy (SWL), ureteroscopy and laser lithotripsy with stent placement, and percutaneous nephrolithotomy.  We discussed that  management is based on stone size, location, density, patient co-morbidities, and patient preference.   Stones <51mm in size have a >80% spontaneous passage rate. Data surrounding the use of tamsulosin for medical expulsive therapy is controversial, but meta analyses suggests it is most efficacious for distal stones between 5-23mm in size. Possible side effects include dizziness/lightheadedness, and retrograde ejaculation.  SWL has a lower stone free rate in a single procedure, but also a lower complication rate compared to ureteroscopy and avoids a stent and associated stent related symptoms. Possible complications include renal hematoma, steinstrasse, and need for additional treatment.  Ureteroscopy with laser lithotripsy and stent placement has a higher stone free rate than SWL in a single procedure, however increased complication rate including possible infection, ureteral injury, bleeding, and stent related morbidity. Common stent related symptoms include dysuria, urgency/frequency, and flank pain.  After an extensive discussion of the risks and benefits of the above treatment options, the patient would like to proceed with right shockwave lithotripsy.  1 dose of fluconazole for yeast in urine, but low suspicion for infection based on symptoms and urinalysis.  Nickolas Madrid, MD 01/13/2021  [facility] [address], [place], [place] [address] [phone]

## 2021-01-20 ENCOUNTER — Other Ambulatory Visit: Payer: Self-pay

## 2021-01-20 ENCOUNTER — Other Ambulatory Visit
Admission: RE | Admit: 2021-01-20 | Discharge: 2021-01-20 | Disposition: A | Discharge status: Home / Self Care | Payer: Medicare Other | Source: Ambulatory Visit | Attending: Urology | Admitting: Urology

## 2021-01-20 DIAGNOSIS — Z20822 Contact with and (suspected) exposure to covid-19: Secondary | ICD-10-CM | POA: Diagnosis not present

## 2021-01-20 DIAGNOSIS — Z01812 Encounter for preprocedural laboratory examination: Secondary | ICD-10-CM | POA: Diagnosis not present

## 2021-01-20 LAB — SARS CORONAVIRUS 2 (TAT 6-24 HRS): SARS Coronavirus 2: NEGATIVE

## 2021-01-21 ENCOUNTER — Other Ambulatory Visit: Payer: Self-pay | Admitting: Urology

## 2021-01-21 DIAGNOSIS — N201 Calculus of ureter: Secondary | ICD-10-CM

## 2021-01-21 DIAGNOSIS — N135 Crossing vessel and stricture of ureter without hydronephrosis: Secondary | ICD-10-CM

## 2021-01-22 ENCOUNTER — Encounter: Payer: Self-pay | Admitting: Urology

## 2021-01-22 ENCOUNTER — Encounter
Admission: RE | Disposition: A | Discharge status: Home / Self Care | Payer: Self-pay | Source: Home / Self Care | Attending: Urology

## 2021-01-22 ENCOUNTER — Ambulatory Visit
Admission: RE | Admit: 2021-01-22 | Discharge: 2021-01-22 | Disposition: A | Discharge status: Home / Self Care | Payer: Medicare Other | Attending: Urology | Admitting: Urology

## 2021-01-22 ENCOUNTER — Ambulatory Visit: Payer: Medicare Other

## 2021-01-22 DIAGNOSIS — I10 Essential (primary) hypertension: Secondary | ICD-10-CM | POA: Diagnosis not present

## 2021-01-22 DIAGNOSIS — Z8052 Family history of malignant neoplasm of bladder: Secondary | ICD-10-CM | POA: Diagnosis not present

## 2021-01-22 DIAGNOSIS — Z823 Family history of stroke: Secondary | ICD-10-CM | POA: Diagnosis not present

## 2021-01-22 DIAGNOSIS — Z820 Family history of epilepsy and other diseases of the nervous system: Secondary | ICD-10-CM | POA: Diagnosis not present

## 2021-01-22 DIAGNOSIS — Z955 Presence of coronary angioplasty implant and graft: Secondary | ICD-10-CM | POA: Insufficient documentation

## 2021-01-22 DIAGNOSIS — R310 Gross hematuria: Secondary | ICD-10-CM | POA: Diagnosis not present

## 2021-01-22 DIAGNOSIS — N200 Calculus of kidney: Secondary | ICD-10-CM | POA: Insufficient documentation

## 2021-01-22 DIAGNOSIS — Z87891 Personal history of nicotine dependence: Secondary | ICD-10-CM | POA: Diagnosis not present

## 2021-01-22 DIAGNOSIS — I2510 Atherosclerotic heart disease of native coronary artery without angina pectoris: Secondary | ICD-10-CM | POA: Diagnosis not present

## 2021-01-22 DIAGNOSIS — K219 Gastro-esophageal reflux disease without esophagitis: Secondary | ICD-10-CM | POA: Diagnosis not present

## 2021-01-22 DIAGNOSIS — Z8249 Family history of ischemic heart disease and other diseases of the circulatory system: Secondary | ICD-10-CM | POA: Insufficient documentation

## 2021-01-22 DIAGNOSIS — Z801 Family history of malignant neoplasm of trachea, bronchus and lung: Secondary | ICD-10-CM | POA: Diagnosis not present

## 2021-01-22 DIAGNOSIS — R7303 Prediabetes: Secondary | ICD-10-CM | POA: Diagnosis not present

## 2021-01-22 DIAGNOSIS — I252 Old myocardial infarction: Secondary | ICD-10-CM | POA: Diagnosis not present

## 2021-01-22 DIAGNOSIS — Z87442 Personal history of urinary calculi: Secondary | ICD-10-CM | POA: Diagnosis not present

## 2021-01-22 DIAGNOSIS — Z419 Encounter for procedure for purposes other than remedying health state, unspecified: Secondary | ICD-10-CM

## 2021-01-22 HISTORY — PX: EXTRACORPOREAL SHOCK WAVE LITHOTRIPSY: SHX1557

## 2021-01-22 SURGERY — LITHOTRIPSY, ESWL
Anesthesia: Moderate Sedation | Laterality: Right

## 2021-01-22 MED ORDER — CEPHALEXIN 500 MG PO CAPS
ORAL_CAPSULE | ORAL | Status: AC
  Administered 2021-01-22: 500 mg via ORAL
  Filled 2021-01-22: qty 1

## 2021-01-22 MED ORDER — ONDANSETRON HCL 4 MG/2ML IJ SOLN
4.0000 mg | Freq: Once | INTRAMUSCULAR | Status: AC
  Administered 2021-01-22: 4 mg via INTRAVENOUS

## 2021-01-22 MED ORDER — ONDANSETRON HCL 4 MG/2ML IJ SOLN
INTRAMUSCULAR | Status: AC
  Filled 2021-01-22: qty 2

## 2021-01-22 MED ORDER — DIPHENHYDRAMINE HCL 25 MG PO CAPS: 25.0000 mg | ORAL_CAPSULE | Freq: Once | ORAL | Status: AC

## 2021-01-22 MED ORDER — CEPHALEXIN 500 MG PO CAPS: 500.0000 mg | ORAL_CAPSULE | Freq: Once | ORAL | Status: AC

## 2021-01-22 MED ORDER — SODIUM CHLORIDE FLUSH 0.9 % IV SOLN
INTRAVENOUS | Status: AC
  Filled 2021-01-22: qty 10

## 2021-01-22 MED ORDER — OXYCODONE-ACETAMINOPHEN 5-325 MG PO TABS: 1.0000 | ORAL_TABLET | ORAL | 0 refills | Status: DC | PRN

## 2021-01-22 MED ORDER — DIPHENHYDRAMINE HCL 25 MG PO CAPS
ORAL_CAPSULE | ORAL | Status: AC
  Administered 2021-01-22: 25 mg via ORAL
  Filled 2021-01-22: qty 1

## 2021-01-22 MED ORDER — DIAZEPAM 5 MG PO TABS
ORAL_TABLET | ORAL | Status: AC
  Administered 2021-01-22: 10 mg via ORAL
  Filled 2021-01-22: qty 2

## 2021-01-22 MED ORDER — CEPHALEXIN 250 MG PO CAPS
500.0000 mg | ORAL_CAPSULE | ORAL | Status: AC
Start: 2021-01-22 — End: 2021-01-23

## 2021-01-22 MED ORDER — DIAZEPAM 5 MG PO TABS: 10.0000 mg | ORAL_TABLET | Freq: Once | ORAL | Status: AC

## 2021-01-22 MED ORDER — SODIUM CHLORIDE 0.9 % IV SOLN: Freq: Once | INTRAVENOUS | Status: DC

## 2021-01-22 NOTE — Interval H&P Note (Signed)
History and Physical Interval Note:  01/22/2021 7:39 AM  Michael Leon  has presented today for surgery, with the diagnosis of kidney stone.  The various methods of treatment have been discussed with the patient and family. After consideration of risks, benefits and other options for treatment, the patient has consented to  Procedure(s): EXTRACORPOREAL SHOCK WAVE LITHOTRIPSY (ESWL) (Right) as a surgical intervention.  The patient's history has been reviewed, patient examined, no change in status, stable for surgery.  I have reviewed the patient's chart and labs.  Questions were answered to the patient's satisfaction.    [facility]AB  Hollice Espy

## 2021-01-22 NOTE — Discharge Instructions (Signed)
AMBULATORY SURGERY  DISCHARGE INSTRUCTIONS   1) The drugs that you were given will stay in your system until tomorrow so for the next 24 hours you should not:  A) Drive an automobile B) Make any legal decisions C) Drink any alcoholic beverage   2) You may resume regular meals tomorrow.  Today it is better to start with liquids and gradually work up to solid foods.  You may eat anything you prefer, but it is better to start with liquids, then soup and crackers, and gradually work up to solid foods.   3) Please notify your doctor immediately if you have any unusual bleeding, trouble breathing, redness and pain at the surgery site, drainage, fever, or pain not relieved by medication.    4) Additional Instructions:   Please contact your physician with any problems or Same Day Surgery at [phone], Monday through Friday 6 am to 4 pm, or [place] at [facility] number at [phone].See [facility] discharge instructions in chart.

## 2021-01-23 ENCOUNTER — Encounter: Payer: Self-pay | Admitting: Urology

## 2021-01-26 ENCOUNTER — Ambulatory Visit: Payer: Medicare Other

## 2021-01-27 DIAGNOSIS — E669 Obesity, unspecified: Secondary | ICD-10-CM | POA: Diagnosis not present

## 2021-01-27 DIAGNOSIS — E782 Mixed hyperlipidemia: Secondary | ICD-10-CM | POA: Diagnosis not present

## 2021-01-27 DIAGNOSIS — I10 Essential (primary) hypertension: Secondary | ICD-10-CM | POA: Diagnosis not present

## 2021-01-27 DIAGNOSIS — I2510 Atherosclerotic heart disease of native coronary artery without angina pectoris: Secondary | ICD-10-CM | POA: Diagnosis not present

## 2021-02-03 ENCOUNTER — Other Ambulatory Visit: Payer: Self-pay | Admitting: Urology

## 2021-02-03 ENCOUNTER — Encounter: Payer: Self-pay | Admitting: Urology

## 2021-02-03 ENCOUNTER — Other Ambulatory Visit: Payer: Self-pay

## 2021-02-03 ENCOUNTER — Ambulatory Visit
Admission: RE | Admit: 2021-02-03 | Discharge: 2021-02-03 | Disposition: A | Discharge status: Home / Self Care | Payer: Medicare Other | Source: Ambulatory Visit | Attending: Urology | Admitting: Urology

## 2021-02-03 ENCOUNTER — Ambulatory Visit (INDEPENDENT_AMBULATORY_CARE_PROVIDER_SITE_OTHER): Payer: Medicare Other | Admitting: Urology

## 2021-02-03 ENCOUNTER — Ambulatory Visit
Admission: RE | Admit: 2021-02-03 | Discharge: 2021-02-03 | Disposition: A | Discharge status: Home / Self Care | Payer: Medicare Other | Attending: Urology | Admitting: Urology

## 2021-02-03 ENCOUNTER — Ambulatory Visit: Payer: Self-pay | Admitting: Family Medicine

## 2021-02-03 ENCOUNTER — Other Ambulatory Visit: Payer: Self-pay | Admitting: *Deleted

## 2021-02-03 ENCOUNTER — Telehealth: Payer: Self-pay

## 2021-02-03 VITALS — BP 162/70 | HR 73 | Ht 65.0 in | Wt 204.0 lb

## 2021-02-03 DIAGNOSIS — N201 Calculus of ureter: Secondary | ICD-10-CM

## 2021-02-03 DIAGNOSIS — Z87442 Personal history of urinary calculi: Secondary | ICD-10-CM | POA: Diagnosis not present

## 2021-02-03 MED ORDER — TAMSULOSIN HCL 0.4 MG PO CAPS: 0.4000 mg | ORAL_CAPSULE | Freq: Every day | ORAL | 0 refills | Status: DC

## 2021-02-03 MED ORDER — OXYCODONE-ACETAMINOPHEN 5-325 MG PO TABS: 1.0000 | ORAL_TABLET | ORAL | 0 refills | Status: AC | PRN

## 2021-02-03 NOTE — Telephone Encounter (Signed)
Percocet sent to pharmacy, he should update [place] Thursday AM if not doing well, otherwise keep follow up as scheduled  Nickolas Madrid, MD 02/03/2021

## 2021-02-03 NOTE — Telephone Encounter (Signed)
Pt LVM on triage line stating that he was seen this morning in clinic since that time he is having a significant increase in RT sided flank pain. Please advise.

## 2021-02-03 NOTE — Telephone Encounter (Signed)
Continue flomax, NSAIDS, does he need a refill on pain meds, and if so what has worked best for him?  If still having pain tomorrow we can talk about adding to OR schedule with me this Friday for ureteroscopy and laser lithotripsy  Nickolas Madrid, MD 02/03/2021

## 2021-02-03 NOTE — Telephone Encounter (Signed)
Called pt informed him of the information below. Pt voiced understanding. He has just taken his last 2 tablets of Perocet 5-325mg  and has had relief. This has been working well for him.

## 2021-02-03 NOTE — Progress Notes (Signed)
   02/03/2021 9:43 AM   Michael Leon Day 28-May-1951 [id number]  Reason for visit: Follow up right shockwave lithotripsy  HPI: 70 year old male with a 5 mm right proximal ureteral stone who underwent shockwave lithotripsy with Dr. Erlene Quan on 01/23/2020.  He apparently was moving quite a bit during the procedure making stone targeting challenging.  He has done well since surgery and really denies any pain except for last night he had some right-sided flank pain.  He has not had any hematuria.  I personally reviewed his KUB today that shows some persistent calcification over the UPJ.  We discussed options at length.  With his minimal symptoms I think it is reasonable to continue to trial passage with Flomax.  Return precautions discussed extensively.  RTC 2 weeks with me KUB and UA prior   Billey Co, MD  [facility] [address] [place], [place] [address] [phone]

## 2021-02-10 ENCOUNTER — Telehealth: Payer: Self-pay | Admitting: *Deleted

## 2021-02-10 NOTE — Telephone Encounter (Signed)
Patient called in this morning and states he is out of pain medication . He states he has had pain in his lower back off and on. Advised patient to try Ibuprofen as need for pain . Patient states he will try that . He will call back if needed.

## 2021-02-11 ENCOUNTER — Encounter: Payer: Self-pay | Admitting: Family Medicine

## 2021-02-11 ENCOUNTER — Other Ambulatory Visit: Payer: Self-pay

## 2021-02-11 ENCOUNTER — Ambulatory Visit (INDEPENDENT_AMBULATORY_CARE_PROVIDER_SITE_OTHER): Payer: Medicare Other | Admitting: Family Medicine

## 2021-02-11 VITALS — BP 150/83 | HR 75 | Wt 210.0 lb

## 2021-02-11 DIAGNOSIS — N401 Enlarged prostate with lower urinary tract symptoms: Secondary | ICD-10-CM

## 2021-02-11 DIAGNOSIS — G4733 Obstructive sleep apnea (adult) (pediatric): Secondary | ICD-10-CM | POA: Diagnosis not present

## 2021-02-11 DIAGNOSIS — Z9989 Dependence on other enabling machines and devices: Secondary | ICD-10-CM | POA: Diagnosis not present

## 2021-02-11 DIAGNOSIS — R059 Cough, unspecified: Secondary | ICD-10-CM

## 2021-02-11 DIAGNOSIS — N200 Calculus of kidney: Secondary | ICD-10-CM

## 2021-02-11 DIAGNOSIS — I10 Essential (primary) hypertension: Secondary | ICD-10-CM | POA: Diagnosis not present

## 2021-02-11 DIAGNOSIS — R7303 Prediabetes: Secondary | ICD-10-CM | POA: Diagnosis not present

## 2021-02-11 DIAGNOSIS — K219 Gastro-esophageal reflux disease without esophagitis: Secondary | ICD-10-CM

## 2021-02-11 DIAGNOSIS — E119 Type 2 diabetes mellitus without complications: Secondary | ICD-10-CM

## 2021-02-11 DIAGNOSIS — E782 Mixed hyperlipidemia: Secondary | ICD-10-CM

## 2021-02-11 MED ORDER — KETOROLAC TROMETHAMINE 10 MG PO TABS: 10.0000 mg | ORAL_TABLET | Freq: Four times a day (QID) | ORAL | 0 refills | Status: DC | PRN

## 2021-02-11 NOTE — Progress Notes (Signed)
Established patient visit   Patient: Michael Leon   DOB: October 29, 1950   70 y.o. Male  MRN: [id number] Visit Date: 02/11/2021  Today's healthcare provider: Wilhemena Durie, MD   Chief Complaint  Patient presents with  . Hypertension  . Hyperlipidemia  . Prediabetes   I,Porsha C McClurkin,acting as a scribe for Wilhemena Durie, MD.,have documented all relevant documentation on the behalf of Wilhemena Durie, MD,as directed by  Wilhemena Durie, MD while in the presence of Wilhemena Durie, MD. Subjective    HPI  Patient had recent kidney stones about 2 weeks ago and last week had lithotripsy.  He still has some pain which is helped more with Toradol and then by narcotics.  He request a prescription. He also has some allergies and for the past 4 days has had a productive cough only first thing in the morning.  Otherwise he feels well with no fever no hemoptysis and no other cough. Hypertension, follow-up  BP Readings from Last 3 Encounters:  02/11/21 (!) 150/83  02/03/21 (!) 162/70  01/22/21 (!) 116/94   Wt Readings from Last 3 Encounters:  02/11/21 210 lb (95.3 kg)  02/03/21 204 lb (92.5 kg)  01/13/21 203 lb (92.1 kg)     He was last seen for hypertension 3 months ago.  BP at that visit was 133/88. Management since that visit includes on losartan. He reports good compliance with treatment. He is not having side effects.  He is not exercising. He is adherent to low salt diet.   Outside blood pressures are not being checked.  He does not smoke.  Use of agents associated with hypertension: none.   --------------------------------------------------------------------------------------------------- Lipid/Cholesterol, follow-up  Last Lipid Panel: Lab Results  Component Value Date   CHOL 145 01/29/2020   LDLCALC 72 01/29/2020   LDLDIRECT 127.0 05/03/2007   HDL 42 01/29/2020   TRIG 183 (H) 01/29/2020    He was last seen for this 1 years ago.   Management since that visit includes; on atorvastatin.  He reports good compliance with treatment. He is not having side effects.  He is following a Regular diet. Current exercise: none  Last metabolic panel Lab Results  Component Value Date   GLUCOSE 133 (H) 10/14/2020   NA 137 10/14/2020   K 4.1 10/14/2020   BUN 13 10/14/2020   CREATININE 0.98 10/14/2020   GFRNONAA >60 10/14/2020   GFRAA 91 01/29/2020   CALCIUM 9.1 10/14/2020   AST 17 01/29/2020   ALT 20 01/29/2020   The ASCVD Risk score Mikey Bussing DC Jr., et al., 2013) failed to calculate for the following reasons:   The patient has a prior MI or stroke diagnosis  --------------------------------------------------------------------------------------------------- Prediabetes, Follow-up  Lab Results  Component Value Date   HGBA1C 6.2 (A) 08/07/2020   HGBA1C 6.2 (H) 01/29/2020   HGBA1C 6.5 (A) 11/16/2018   GLUCOSE 133 (H) 10/14/2020   GLUCOSE 106 (H) 01/29/2020   GLUCOSE 125 (H) 12/29/2018    Last seen for for this6 months ago.  Management since that visit includes, no changes were made at this visit. Current symptoms include none and have been improving.  Prior visit with dietician: no Current diet: well balanced Current exercise: none  Pertinent Labs:    Component Value Date/Time   CHOL 145 01/29/2020 0947   TRIG 183 (H) 01/29/2020 0947   CHOLHDL 3.5 01/29/2020 0947   CHOLHDL 5.0 CALC 05/03/2007 1022   CREATININE 0.98 10/14/2020 1128  CREATININE 1.17 03/29/2012 0146    Wt Readings from Last 3 Encounters:  02/11/21 210 lb (95.3 kg)  02/03/21 204 lb (92.5 kg)  01/13/21 203 lb (92.1 kg)    -----------------------------------------------------------------------------------------       Medications: Outpatient Medications Prior to Visit  Medication Sig  . aspirin EC 81 MG tablet Take 81 mg by mouth daily. Swallow whole.  Marland Kitchen atorvastatin (LIPITOR) 80 MG tablet Take by mouth.  . losartan (COZAAR) 50 MG  tablet Take 1 tablet (50 mg total) by mouth daily.  Marland Kitchen oxyCODONE-acetaminophen (PERCOCET) 5-325 MG tablet Take 1-2 tablets by mouth every 4 (four) hours as needed for moderate pain or severe pain.  . pantoprazole (PROTONIX) 40 MG tablet TAKE 1 TABLET(40 MG) BY MOUTH TWICE DAILY  . PARoxetine (PAXIL) 40 MG tablet TAKE 1 TABLET(40 MG) BY MOUTH DAILY  . tamsulosin (FLOMAX) 0.4 MG CAPS capsule Take 1 capsule (0.4 mg total) by mouth daily.  . fluconazole (DIFLUCAN) 200 MG tablet Take 200 mg by mouth once. (Patient not taking: Reported on 02/11/2021)  . phentermine 30 MG capsule Take 1 capsule (30 mg total) by mouth every morning. (Patient not taking: Reported on 02/11/2021)  . triamcinolone cream (KENALOG) 0.1 % Apply 1 application topically daily as needed. (Patient not taking: Reported on 02/11/2021)   No facility-administered medications prior to visit.    Review of Systems  Constitutional: Negative for appetite change, chills and fever.  Respiratory: Negative for chest tightness, shortness of breath and wheezing.   Cardiovascular: Negative for chest pain and palpitations.  Gastrointestinal: Negative for abdominal pain, nausea and vomiting.  Allergic/Immunologic: Negative.   Psychiatric/Behavioral: Negative.         Objective    BP (!) 150/83 (BP Location: Left Arm, Patient Position: Sitting, Cuff Size: Normal)   Pulse 75   Wt 210 lb (95.3 kg)   SpO2 99%   BMI 34.95 kg/m  BP Readings from Last 3 Encounters:  02/11/21 (!) 150/83  02/03/21 (!) 162/70  01/22/21 (!) 116/94   Wt Readings from Last 3 Encounters:  02/11/21 210 lb (95.3 kg)  02/03/21 204 lb (92.5 kg)  01/13/21 203 lb (92.1 kg)       Physical Exam Vitals reviewed.  Constitutional:      Appearance: Normal appearance. He is well-developed.  HENT:     Head: Normocephalic and atraumatic.     Right Ear: Tympanic membrane and external ear normal.     Left Ear: Tympanic membrane and external ear normal.     Nose: Nose  normal.     Mouth/Throat:     Mouth: Mucous membranes are moist.     Pharynx: Oropharynx is clear.  Eyes:     General: No scleral icterus.    Conjunctiva/sclera: Conjunctivae normal.  Cardiovascular:     Rate and Rhythm: Normal rate and regular rhythm.     Heart sounds: Normal heart sounds.  Pulmonary:     Effort: Pulmonary effort is normal.     Breath sounds: Normal breath sounds.  Abdominal:     Palpations: Abdomen is soft.  Lymphadenopathy:     Cervical: No cervical adenopathy.  Skin:    General: Skin is warm and dry.  Neurological:     Mental Status: He is alert and oriented to person, place, and time.  Psychiatric:        Mood and Affect: Mood normal.        Behavior: Behavior normal.        Thought Content:  Thought content normal.        Judgment: Judgment normal.       No results found for any visits on 02/11/21.  Assessment & Plan     1. Kidney stones Patient is given a prescription for 20 Toradol tablets to take over the next few days.  No refills.  He is status post lithotripsy and improving.  2. Benign essential HTN Fairly good control.  Check blood pressures at home. - TSH - Comprehensive metabolic panel  3. Mixed hyperlipidemia On atorvastatin - TSH - Lipid panel - CBC with Differential/Platelet  4. Prediabetes Follow-up A1c and treat accordingly - CBC with Differential/Platelet - Hemoglobin A1c  5. Benign prostatic hyperplasia with lower urinary tract symptoms, symptom details unspecified  - PSA  6. OSA on CPAP   7. Gastroesophageal reflux disease, unspecified whether esophagitis present On pantoprazole.  8. Cough Off could be allergies or URI or reflux related.  No work-up needed as he only has it first thing in the morning and it completely resolved after that.   No follow-ups on file.      I, Wilhemena Durie, MD, have reviewed all documentation for this visit. The documentation on 02/16/21 for the exam, diagnosis, procedures,  and orders are all accurate and complete.    Rima Blizzard Cranford Mon, MD  [facility] [phone] (phone) [phone] (fax)  [place]

## 2021-02-11 NOTE — Patient Instructions (Signed)
Take Robitussin

## 2021-02-17 ENCOUNTER — Other Ambulatory Visit: Payer: Self-pay

## 2021-02-17 ENCOUNTER — Ambulatory Visit (INDEPENDENT_AMBULATORY_CARE_PROVIDER_SITE_OTHER): Payer: Medicare Other | Admitting: Urology

## 2021-02-17 ENCOUNTER — Ambulatory Visit
Admission: RE | Admit: 2021-02-17 | Discharge: 2021-02-17 | Disposition: A | Discharge status: Home / Self Care | Payer: Medicare Other | Attending: Urology | Admitting: Urology

## 2021-02-17 ENCOUNTER — Other Ambulatory Visit
Admission: RE | Admit: 2021-02-17 | Discharge: 2021-02-17 | Disposition: A | Discharge status: Home / Self Care | Payer: Medicare Other | Source: Home / Self Care | Attending: Family Medicine | Admitting: Family Medicine

## 2021-02-17 ENCOUNTER — Ambulatory Visit
Admission: RE | Admit: 2021-02-17 | Discharge: 2021-02-17 | Disposition: A | Discharge status: Home / Self Care | Payer: Medicare Other | Source: Ambulatory Visit | Attending: Urology | Admitting: Urology

## 2021-02-17 ENCOUNTER — Encounter: Payer: Self-pay | Admitting: Urology

## 2021-02-17 VITALS — BP 169/75 | HR 71 | Ht 65.0 in | Wt 206.0 lb

## 2021-02-17 DIAGNOSIS — N201 Calculus of ureter: Secondary | ICD-10-CM | POA: Diagnosis not present

## 2021-02-17 DIAGNOSIS — E782 Mixed hyperlipidemia: Secondary | ICD-10-CM | POA: Diagnosis not present

## 2021-02-17 DIAGNOSIS — R7303 Prediabetes: Secondary | ICD-10-CM | POA: Diagnosis not present

## 2021-02-17 DIAGNOSIS — Z87442 Personal history of urinary calculi: Secondary | ICD-10-CM | POA: Diagnosis not present

## 2021-02-17 DIAGNOSIS — I10 Essential (primary) hypertension: Secondary | ICD-10-CM | POA: Diagnosis not present

## 2021-02-17 DIAGNOSIS — N401 Enlarged prostate with lower urinary tract symptoms: Secondary | ICD-10-CM | POA: Diagnosis not present

## 2021-02-17 LAB — URINALYSIS, COMPLETE (UACMP) WITH MICROSCOPIC
Bilirubin Urine: NEGATIVE
Glucose, UA: NEGATIVE mg/dL
Ketones, ur: NEGATIVE mg/dL
Nitrite: NEGATIVE
Protein, ur: NEGATIVE mg/dL
RBC / HPF: >50 RBC/hpf (ref 0–5)
Specific Gravity, Urine: 1.015 (ref 1.005–1.030)
pH: 5.5 (ref 5.0–8.0)

## 2021-02-17 NOTE — Patient Instructions (Addendum)
Laser Therapy for Kidney Stones Laser therapy for kidney stones is a procedure to break up small, hard mineral deposits that form in the kidney (kidney stones). The procedure is done using a device that produces a focused beam of light (laser). The laser breaks up kidney stones into pieces that are small enough to be passed out of the body through urination or removed from the body during the procedure. You may need laser therapy if you have kidney stones that are painful or block your urinary tract. This procedure is done by inserting a tube (ureteroscope) into your kidney through the urethral opening. The urethra is the part of the body that drains urine from the bladder. In women, the urethra opens above the vaginal opening. In men, the urethra opens at the tip of the penis. The ureteroscope is inserted through the urethra, and surgical instruments are moved through the bladder and the muscular tube that connects the kidney to the bladder (ureter) until they reach the kidney. Tell a health care provider about:  Any allergies you have.  All medicines you are taking, including vitamins, herbs, eye drops, creams, and over-the-counter medicines.  Any problems you or family members have had with anesthetic medicines.  Any blood disorders you have.  Any surgeries you have had.  Any medical conditions you have.  Whether you are pregnant or may be pregnant. What are the risks? Generally, this is a safe procedure. However, problems may occur, including:  Infection.  Bleeding.  Allergic reactions to medicines.  Damage to the urethra, bladder, or ureter.  Urinary tract infection (UTI).  Narrowing of the urethra (urethral stricture).  Difficulty passing urine.  Blockage of the kidney caused by a fragment of kidney stone. What happens before the procedure? Medicines  Ask your health care provider about: ? Changing or stopping your regular medicines. This is especially important if you  are taking diabetes medicines or blood thinners. ? Taking medicines such as aspirin and ibuprofen. These medicines can thin your blood. Do not take these medicines unless your health care provider tells you to take them. ? Taking over-the-counter medicines, vitamins, herbs, and supplements. Eating and drinking Follow instructions from your health care provider about eating and drinking, which may include:  8 hours before the procedure - stop eating heavy meals or foods, such as meat, fried foods, or fatty foods.  6 hours before the procedure - stop eating light meals or foods, such as toast or cereal.  6 hours before the procedure - stop drinking milk or drinks that contain milk.  2 hours before the procedure - stop drinking clear liquids. Staying hydrated Follow instructions from your health care provider about hydration, which may include:  Up to 2 hours before the procedure - you may continue to drink clear liquids, such as water, clear fruit juice, black coffee, and plain tea.   General instructions  You may have a physical exam before the procedure. You may also have tests, such as imaging tests and blood or urine tests.  If your ureter is too narrow, your health care provider may place a soft, flexible tube (stent) inside of it. The stent may be placed days or weeks before your laser therapy procedure.  Plan to have someone take you home from the hospital or clinic.  If you will be going home right after the procedure, plan to have someone stay with you for 24 hours.  Do not use any products that contain nicotine or tobacco for at least  4 weeks before the procedure. These products include cigarettes, e-cigarettes, and chewing tobacco. If you need help quitting, ask your health care provider.  Ask your health care provider: ? How your surgical site will be marked or identified. ? What steps will be taken to help prevent infection. These may include:  Removing hair at the surgery  site.  Washing skin with a germ-killing soap.  Taking antibiotic medicine. What happens during the procedure?  An IV will be inserted into one of your veins.  You will be given one or more of the following: ? A medicine to help you relax (sedative). ? A medicine to numb the area (local anesthetic). ? A medicine to make you fall asleep (general anesthetic).  A ureteroscope will be inserted into your urethra. The ureteroscope will send images to a video screen in the operating room to guide your surgeon to the area of your kidney that will be treated.  A small, flexible tube will be threaded through the ureteroscope and into your bladder and ureter, up to your kidney.  The laser device will be inserted into your kidney through the tube. Your surgeon will pulse the laser on and off to break up kidney stones.  A surgical instrument that has a tiny wire basket may be inserted through the tube into your kidney to remove the pieces of broken kidney stone. The procedure may vary among health care providers and hospitals.   What happens after the procedure?  Your blood pressure, heart rate, breathing rate, and blood oxygen level will be monitored until you leave the hospital or clinic.  You will be given pain medicine as needed.  You may continue to receive antibiotics.  You may have a stent temporarily placed in your ureter.  Do not drive for 24 hours if you were given a sedative during your procedure.  You may be given a strainer to collect any stone fragments that you pass in your urine. Your health care provider may have these tested. Summary  Laser therapy for kidney stones is a procedure to break up kidney stones into pieces that are small enough to be passed out of the body through urination or removed during the procedure.  Follow instructions from your health care provider about eating and drinking before the procedure.  During the procedure, the ureteroscope will send images  to a video screen to guide your surgeon to the area of your kidney that will be treated.  Do not drive for 24 hours if you were given a sedative during your procedure. This information is not intended to replace advice given to you by your health care provider. Make sure you discuss any questions you have with your health care provider. Document Revised: 06/15/2018 Document Reviewed: 06/15/2018 Elsevier Patient Education  2021 [employer].   Ureteral Stent Implantation  Ureteral stent implantation is a procedure to insert (implant) a flexible, soft, plastic tube (stent) into a ureter. Ureters are the tube-like parts of the body that drain urine from the kidneys. The stent supports the ureter while it heals and helps to drain urine. You may have a ureteral stent implanted after having a procedure to remove a blockage from the ureter (ureterolysis or pyeloplasty). You may also have a stent implanted to open the flow of urine when you have a blockage caused by a kidney stone, tumor, blood clot, or infection. You have two ureters, one on each side of the body. The ureters connect the kidneys to the organ that  holds urine until it passes out of the body (bladder). The stent is placed so that one end is in the kidney, and one end is in the bladder. The stent is usually taken out after your ureter has healed. Depending on your condition, you may have a stent for just a few weeks, or you may have a long-term stent that will need to be replaced every few months. Tell a health care provider about:  Any allergies you have.  All medicines you are taking, including vitamins, herbs, eye drops, creams, and over-the-counter medicines.  Any problems you or family members have had with anesthetic medicines.  Any blood disorders you have.  Any surgeries you have had.  Any medical conditions you have.  Whether you are pregnant or may be pregnant. What are the risks? Generally, this is a safe procedure.  However, problems may occur, including:  Infection.  Bleeding.  Allergic reactions to medicines.  Damage to other structures or organs. Tearing (perforation) of the ureter is possible.  Movement of the stent away from where it is placed during surgery (migration). What happens before the procedure? Medicines Ask your health care provider about:  Changing or stopping your regular medicines. This is especially important if you are taking diabetes medicines or blood thinners.  Taking medicines such as aspirin and ibuprofen. These medicines can thin your blood. Do not take these medicines unless your health care provider tells you to take them.  Taking over-the-counter medicines, vitamins, herbs, and supplements. Eating and drinking Follow instructions from your health care provider about eating and drinking, which may include:  8 hours before the procedure - stop eating heavy meals or foods, such as meat, fried foods, or fatty foods.  6 hours before the procedure - stop eating light meals or foods, such as toast or cereal.  6 hours before the procedure - stop drinking milk or drinks that contain milk.  2 hours before the procedure - stop drinking clear liquids. Staying hydrated Follow instructions from your health care provider about hydration, which may include:  Up to 2 hours before the procedure - you may continue to drink clear liquids, such as water, clear fruit juice, black coffee, and plain tea. General instructions  Do not drink alcohol.  Do not use any products that contain nicotine or tobacco for at least 4 weeks before the procedure. These products include cigarettes, e-cigarettes, and chewing tobacco. If you need help quitting, ask your health care provider.  You may have an exam or testing, such as imaging or blood tests.  Ask your health care provider what steps will be taken to help prevent infection. These may include: ? Removing hair at the surgery  site. ? Washing skin with a germ-killing soap. ? Taking antibiotic medicine.  Plan to have someone take you home from the hospital or clinic.  If you will be going home right after the procedure, plan to have someone with you for 24 hours. What happens during the procedure?  An IV will be inserted into one of your veins.  You may be given a medicine to help you relax (sedative).  You may be given a medicine to make you fall asleep (general anesthetic).  A thin, tube-shaped instrument with a light and tiny camera at the end (cystoscope) will be inserted into your urethra. The urethra is the tube that drains urine from the bladder out of the body. In men, the urethra opens at the end of the penis. In women, the  urethra opens in front of the vaginal opening.  The cystoscope will be passed into your bladder.  A thin wire (guide wire) will be passed through your bladder and into your ureter. This is used to guide the stent into your ureter.  The stent will be inserted into your ureter.  The guide wire and the cystoscope will be removed.  A flexible tube (catheter) may be inserted through your urethra so that one end is in your bladder. This helps to drain urine from your bladder. The procedure may vary among hospitals and health care providers. What happens after the procedure?  Your blood pressure, heart rate, breathing rate, and blood oxygen level will be monitored until you leave the hospital or clinic.  You may continue to receive medicine and fluids through an IV.  You may have some soreness or pain in your abdomen and urethra. Medicines will be available to help you.  You will be encouraged to get up and walk around as soon as you can.  You may have a catheter draining your urine.  You will have some blood in your urine.  Do not drive for 24 hours if you were given a sedative during your procedure. Summary  Ureteral stent implantation is a procedure to insert a flexible,  soft, plastic tube (stent) into a ureter.  You may have a stent implanted to support the ureter while it heals after a procedure or to open the flow of urine if there is a blockage.  Follow instructions from your health care provider about taking medicines and about eating and drinking before the procedure.  Depending on your condition, you may have a stent for just a few days, or 1-2 weeks This information is not intended to replace advice given to you by your health care provider. Make sure you discuss any questions you have with your health care provider. Document Revised: 07/11/2018 Document Reviewed: 07/12/2018 Elsevier Patient Education  2021 [employer].

## 2021-02-17 NOTE — Progress Notes (Signed)
   02/17/2021 1:50 PM   Michael Leon July 11, 1951 [id number]  Reason for visit: Follow up right proximal ureteral stone  HPI: 70 year old male who originally presented with a 5 mm right UPJ stone and renal colic.  He originally underwent shockwave lithotripsy with Dr. Erlene Quan on 01/22/2021, but this procedure was challenging secondary to extensive patient movement limiting efficacy.  He did well for the first few weeks after this procedure, but then developed recurrent right-sided flank pain.  KUB suggested persistent calcifications at the right UPJ, and urinalysis today shows persistent microscopic hematuria.  He continues to have intermittent right renal colic requiring pain medications.  He denies any fevers, or UTI symptoms.  We discussed options including continuing medical expulsive therapy, repeat shockwave lithotripsy, or right ureteroscopy, laser lithotripsy, stent placement.  I recommended ureteroscopy with his persistent pain, likely residual stone fragments, and challenging shockwave without complete stone clearance previously.  We specifically discussed the risks ureteroscopy including bleeding, infection/sepsis, stent related symptoms including flank pain/urgency/frequency/incontinence/dysuria, ureteral injury, inability to access stone, or need for staged or additional procedures.  Schedule right ureteroscopy, laser lithotripsy, stent placement this week  [place] [facility], [place] [address], [place], [place] [address] [phone]

## 2021-02-18 ENCOUNTER — Other Ambulatory Visit: Payer: Self-pay | Admitting: Urology

## 2021-02-18 ENCOUNTER — Telehealth: Payer: Self-pay

## 2021-02-18 DIAGNOSIS — N201 Calculus of ureter: Secondary | ICD-10-CM

## 2021-02-18 LAB — LIPID PANEL
Chol/HDL Ratio: 3.3 ratio (ref 0.0–5.0)
Cholesterol, Total: 138 mg/dL (ref 100–199)
HDL: 42 mg/dL (ref >39)
LDL Chol Calc (NIH): 77 mg/dL (ref 0–99)
Triglycerides: 105 mg/dL (ref 0–149)
VLDL Cholesterol Cal: 19 mg/dL (ref 5–40)

## 2021-02-18 LAB — CBC WITH DIFFERENTIAL/PLATELET
Basophils Absolute: 0.1 10*3/uL (ref 0.0–0.2)
Basos: 1 %
EOS (ABSOLUTE): 0.4 10*3/uL (ref 0.0–0.4)
Eos: 4 %
Hematocrit: 43.8 % (ref 37.5–51.0)
Hemoglobin: 14.8 g/dL (ref 13.0–17.7)
Immature Grans (Abs): 0.1 10*3/uL (ref 0.0–0.1)
Immature Granulocytes: 1 %
Lymphocytes Absolute: 2.6 10*3/uL (ref 0.7–3.1)
Lymphs: 24 %
MCH: 31.4 pg (ref 26.6–33.0)
MCHC: 33.8 g/dL (ref 31.5–35.7)
MCV: 93 fL (ref 79–97)
Monocytes Absolute: 0.8 10*3/uL (ref 0.1–0.9)
Monocytes: 7 %
Neutrophils Absolute: 6.8 10*3/uL (ref 1.4–7.0)
Neutrophils: 63 %
Platelets: 224 10*3/uL (ref 150–450)
RBC: 4.72 x10E6/uL (ref 4.14–5.80)
RDW: 12.8 % (ref 11.6–15.4)
WBC: 10.6 10*3/uL (ref 3.4–10.8)

## 2021-02-18 LAB — COMPREHENSIVE METABOLIC PANEL
ALT: 12 IU/L (ref 0–44)
AST: 16 IU/L (ref 0–40)
Albumin/Globulin Ratio: 1.8 (ref 1.2–2.2)
Albumin: 4.2 g/dL (ref 3.8–4.8)
Alkaline Phosphatase: 74 IU/L (ref 44–121)
BUN/Creatinine Ratio: 13 (ref 10–24)
BUN: 16 mg/dL (ref 8–27)
Bilirubin Total: 0.4 mg/dL (ref 0.0–1.2)
CO2: 20 mmol/L (ref 20–29)
Calcium: 9.4 mg/dL (ref 8.6–10.2)
Chloride: 103 mmol/L (ref 96–106)
Creatinine, Ser: 1.22 mg/dL (ref 0.76–1.27)
Globulin, Total: 2.3 g/dL (ref 1.5–4.5)
Glucose: 133 mg/dL — ABNORMAL HIGH (ref 65–99)
Potassium: 4.6 mmol/L (ref 3.5–5.2)
Sodium: 140 mmol/L (ref 134–144)
Total Protein: 6.5 g/dL (ref 6.0–8.5)
eGFR: 65 mL/min/{1.73_m2} (ref >59)

## 2021-02-18 LAB — TSH: TSH: 1.6 u[IU]/mL (ref 0.450–4.500)

## 2021-02-18 LAB — HEMOGLOBIN A1C
Est. average glucose Bld gHb Est-mCnc: 160 mg/dL
Hgb A1c MFr Bld: 7.2 % — ABNORMAL HIGH (ref 4.8–5.6)

## 2021-02-18 NOTE — Telephone Encounter (Signed)
As of now, results have not been reviewed by Dr. Rosanna Randy.

## 2021-02-18 NOTE — Telephone Encounter (Signed)
Copied from [place] [id number]. Topic: Quick Communication - Lab Results (Clinic Use ONLY) >> Feb 18, 2021  2:02 PM Lennox Solders wrote: Pt is calling and would like someone to call him to go over blood work results also PSA

## 2021-02-19 ENCOUNTER — Other Ambulatory Visit
Admission: RE | Admit: 2021-02-19 | Discharge: 2021-02-19 | Disposition: A | Discharge status: Home / Self Care | Payer: Medicare Other | Source: Ambulatory Visit | Attending: Urology | Admitting: Urology

## 2021-02-19 NOTE — Patient Instructions (Signed)
Your procedure is scheduled on: 02/20/21 Report to the Registration Desk on the 1st floor of the [facility]. To find out your arrival time, please call [phone] between 1PM - 3PM on: 02/19/21  REMEMBER: Instructions that are not followed completely may result in serious medical risk, up to and including death; or upon the discretion of your surgeon and anesthesiologist your surgery may need to be rescheduled.  Do not eat food or drink any fluids after midnight the night before surgery.  No gum chewing, lozengers or hard candies.   TAKE THESE MEDICATIONS THE MORNING OF SURGERY WITH A SIP OF WATER: - atorvastatin (LIPITOR) 80 MG tablet - pantoprazole (PROTONIX) 40 MG tablet, take one the night before and one on the morning of surgery - helps to prevent nausea after surgery. - PARoxetine (PAXIL) 40 MG tablet - tamsulosin (FLOMAX) 0.4 MG CAPS capsule  Follow recommendations from Cardiologist, Pulmonologist or PCP regarding stopping Aspirin, Coumadin, Plavix, Eliquis, Pradaxa, or Pletal.  One week prior to surgery: Stop Anti-inflammatories (NSAIDS) such as Advil, Aleve, Ibuprofen, Motrin, Naproxen, Naprosyn and Aspirin based products such as Excedrin, Goodys Powder, BC Powder.  Stop ANY OVER THE COUNTER supplements until after surgery.  No Alcohol for 24 hours before or after surgery.  No Smoking including e-cigarettes for 24 hours prior to surgery.  No chewable tobacco products for at least 6 hours prior to surgery.  No nicotine patches on the day of surgery.  Do not use any "recreational" drugs for at least a week prior to your surgery.  Please be advised that the combination of cocaine and anesthesia may have negative outcomes, up to and including death. If you test positive for cocaine, your surgery will be cancelled.  On the morning of surgery brush your teeth with toothpaste and water, you may rinse your mouth with mouthwash if you wish. Do not swallow any toothpaste or  mouthwash.  Do not wear jewelry, make-up, hairpins, clips or nail polish.  Do not wear lotions, powders, or perfumes.   Do not shave body from the neck down 48 hours prior to surgery just in case you cut yourself which could leave a site for infection.  Also, freshly shaved skin may become irritated if using the CHG soap.  Contact lenses, hearing aids and dentures may not be worn into surgery.  Do not bring valuables to the hospital. [facility] is not responsible for any missing/lost belongings or valuables.   Notify your doctor if there is any change in your medical condition (cold, fever, infection).  Wear comfortable clothing (specific to your surgery type) to the hospital.  Plan for stool softeners for home use; pain medications have a tendency to cause constipation. You can also help prevent constipation by eating foods high in fiber such as fruits and vegetables and drinking plenty of fluids as your diet allows.  After surgery, you can help prevent lung complications by doing breathing exercises.  Take deep breaths and cough every 1-2 hours. Your doctor may order a device called an Incentive Spirometer to help you take deep breaths. When coughing or sneezing, hold a pillow firmly against your incision with both hands. This is called "splinting." Doing this helps protect your incision. It also decreases belly discomfort.  If you are being admitted to the hospital overnight, leave your suitcase in the car. After surgery it may be brought to your room.  If you are being discharged the day of surgery, you will not be allowed to drive home.  You will need a responsible adult (18 years or older) to drive you home and stay with you that night.   If you are taking public transportation, you will need to have a responsible adult (18 years or older) with you. Please confirm with your physician that it is acceptable to use public transportation.   Please call the [facility]  Dept. at [phone] if you have any questions about these instructions.  Surgery Visitation Policy:  Patients undergoing a surgery or procedure may have one family member or support person with them as long as that person is not COVID-19 positive or experiencing its symptoms.  That person may remain in the waiting area during the procedure.  Inpatient Visitation:    Visiting hours are 7 a.m. to 8 p.m. Inpatients will be allowed two visitors daily. The visitors may change each day during the patient's stay. No visitors under the age of 61. Any visitor under the age of 30 must be accompanied by an adult. The visitor must pass COVID-19 screenings, use hand sanitizer when entering and exiting the patient's room and wear a mask at all times, including in the patient's room. Patients must also wear a mask when staff or their visitor are in the room. Masking is required regardless of vaccination status.

## 2021-02-19 NOTE — Pre-Procedure Instructions (Signed)
Patient had recent prior procedure with Dr. Diamantina Providence which included Lithotripsy with stent placement on 01/22/21 , pre admission interview and instructions were partially reviewed as patient declined full interview, patients procedure is 02/20/21, he will review his instructions on his [facility] Mychart.

## 2021-02-20 ENCOUNTER — Ambulatory Visit
Admission: RE | Admit: 2021-02-20 | Discharge: 2021-02-20 | Disposition: A | Discharge status: Home / Self Care | Payer: Medicare Other | Source: Ambulatory Visit | Attending: Urology | Admitting: Urology

## 2021-02-20 ENCOUNTER — Encounter
Admission: RE | Disposition: A | Discharge status: Home / Self Care | Payer: Self-pay | Source: Ambulatory Visit | Attending: Urology

## 2021-02-20 ENCOUNTER — Encounter: Payer: Self-pay | Admitting: Urology

## 2021-02-20 ENCOUNTER — Ambulatory Visit: Payer: Medicare Other | Admitting: Anesthesiology

## 2021-02-20 ENCOUNTER — Ambulatory Visit: Payer: Medicare Other

## 2021-02-20 ENCOUNTER — Other Ambulatory Visit: Payer: Self-pay

## 2021-02-20 DIAGNOSIS — Z955 Presence of coronary angioplasty implant and graft: Secondary | ICD-10-CM | POA: Insufficient documentation

## 2021-02-20 DIAGNOSIS — Z8249 Family history of ischemic heart disease and other diseases of the circulatory system: Secondary | ICD-10-CM | POA: Diagnosis not present

## 2021-02-20 DIAGNOSIS — Z820 Family history of epilepsy and other diseases of the nervous system: Secondary | ICD-10-CM | POA: Insufficient documentation

## 2021-02-20 DIAGNOSIS — Z801 Family history of malignant neoplasm of trachea, bronchus and lung: Secondary | ICD-10-CM | POA: Insufficient documentation

## 2021-02-20 DIAGNOSIS — Z823 Family history of stroke: Secondary | ICD-10-CM | POA: Diagnosis not present

## 2021-02-20 DIAGNOSIS — K219 Gastro-esophageal reflux disease without esophagitis: Secondary | ICD-10-CM | POA: Insufficient documentation

## 2021-02-20 DIAGNOSIS — I10 Essential (primary) hypertension: Secondary | ICD-10-CM | POA: Insufficient documentation

## 2021-02-20 DIAGNOSIS — R7303 Prediabetes: Secondary | ICD-10-CM | POA: Diagnosis not present

## 2021-02-20 DIAGNOSIS — Z01810 Encounter for preprocedural cardiovascular examination: Secondary | ICD-10-CM | POA: Diagnosis not present

## 2021-02-20 DIAGNOSIS — N201 Calculus of ureter: Secondary | ICD-10-CM | POA: Insufficient documentation

## 2021-02-20 DIAGNOSIS — Z87891 Personal history of nicotine dependence: Secondary | ICD-10-CM | POA: Diagnosis not present

## 2021-02-20 DIAGNOSIS — Z8052 Family history of malignant neoplasm of bladder: Secondary | ICD-10-CM | POA: Diagnosis not present

## 2021-02-20 HISTORY — PX: CYSTOSCOPY/URETEROSCOPY/HOLMIUM LASER/STENT PLACEMENT: SHX6546

## 2021-02-20 SURGERY — CYSTOSCOPY/URETEROSCOPY/HOLMIUM LASER/STENT PLACEMENT
Anesthesia: General | Laterality: Right

## 2021-02-20 MED ORDER — FENTANYL CITRATE (PF) 100 MCG/2ML IJ SOLN
INTRAMUSCULAR | Status: DC | PRN
  Administered 2021-02-20: 50 ug via INTRAVENOUS

## 2021-02-20 MED ORDER — ROCURONIUM BROMIDE 100 MG/10ML IV SOLN
INTRAVENOUS | Status: DC | PRN
  Administered 2021-02-20: 50 mg via INTRAVENOUS

## 2021-02-20 MED ORDER — PHENYLEPHRINE HCL (PRESSORS) 10 MG/ML IV SOLN
INTRAVENOUS | Status: AC
  Filled 2021-02-20: qty 1

## 2021-02-20 MED ORDER — ACETAMINOPHEN 10 MG/ML IV SOLN
INTRAVENOUS | Status: DC | PRN
  Administered 2021-02-20: 1000 mg via INTRAVENOUS

## 2021-02-20 MED ORDER — EPHEDRINE SULFATE 50 MG/ML IJ SOLN
INTRAMUSCULAR | Status: DC | PRN
  Administered 2021-02-20: 5 mg via INTRAVENOUS

## 2021-02-20 MED ORDER — SULFAMETHOXAZOLE-TRIMETHOPRIM 800-160 MG PO TABS: 1.0000 | ORAL_TABLET | Freq: Once | ORAL | 0 refills | Status: AC

## 2021-02-20 MED ORDER — ACETAMINOPHEN 10 MG/ML IV SOLN: 1000.0000 mg | Freq: Once | INTRAVENOUS | Status: DC | PRN

## 2021-02-20 MED ORDER — LIDOCAINE HCL URETHRAL/MUCOSAL 2 % EX GEL
CUTANEOUS | Status: AC
  Filled 2021-02-20: qty 10

## 2021-02-20 MED ORDER — ROCURONIUM BROMIDE 10 MG/ML (PF) SYRINGE
PREFILLED_SYRINGE | INTRAVENOUS | Status: AC
  Filled 2021-02-20: qty 10

## 2021-02-20 MED ORDER — MIDAZOLAM HCL 2 MG/2ML IJ SOLN
INTRAMUSCULAR | Status: AC
  Filled 2021-02-20: qty 2

## 2021-02-20 MED ORDER — KETOROLAC TROMETHAMINE 30 MG/ML IJ SOLN
INTRAMUSCULAR | Status: AC
  Filled 2021-02-20: qty 1

## 2021-02-20 MED ORDER — BELLADONNA ALKALOIDS-OPIUM 16.2-60 MG RE SUPP
RECTAL | Status: DC | PRN
  Administered 2021-02-20: 1 via RECTAL

## 2021-02-20 MED ORDER — PROPOFOL 10 MG/ML IV BOLUS
INTRAVENOUS | Status: DC | PRN
  Administered 2021-02-20: 150 mg via INTRAVENOUS

## 2021-02-20 MED ORDER — BELLADONNA ALKALOIDS-OPIUM 16.2-60 MG RE SUPP
RECTAL | Status: AC
  Filled 2021-02-20: qty 1

## 2021-02-20 MED ORDER — IOHEXOL 180 MG/ML  SOLN
INTRAMUSCULAR | Status: DC | PRN
  Administered 2021-02-20: 16 mL

## 2021-02-20 MED ORDER — CHLORHEXIDINE GLUCONATE 0.12 % MT SOLN
OROMUCOSAL | Status: AC
  Administered 2021-02-20: 15 mL via OROMUCOSAL
  Filled 2021-02-20: qty 15

## 2021-02-20 MED ORDER — ONDANSETRON HCL 4 MG/2ML IJ SOLN
INTRAMUSCULAR | Status: DC | PRN
  Administered 2021-02-20: 4 mg via INTRAVENOUS

## 2021-02-20 MED ORDER — ONDANSETRON HCL 4 MG/2ML IJ SOLN
INTRAMUSCULAR | Status: AC
  Filled 2021-02-20: qty 2

## 2021-02-20 MED ORDER — FENTANYL CITRATE (PF) 100 MCG/2ML IJ SOLN: 25.0000 ug | INTRAMUSCULAR | Status: DC | PRN

## 2021-02-20 MED ORDER — CHLORHEXIDINE GLUCONATE 0.12 % MT SOLN: 15.0000 mL | Freq: Once | OROMUCOSAL | Status: AC

## 2021-02-20 MED ORDER — MIDAZOLAM HCL 2 MG/2ML IJ SOLN
INTRAMUSCULAR | Status: DC | PRN
  Administered 2021-02-20: 2 mg via INTRAVENOUS

## 2021-02-20 MED ORDER — FENTANYL CITRATE (PF) 100 MCG/2ML IJ SOLN
INTRAMUSCULAR | Status: AC
  Filled 2021-02-20: qty 2

## 2021-02-20 MED ORDER — ACETAMINOPHEN 10 MG/ML IV SOLN
INTRAVENOUS | Status: AC
  Filled 2021-02-20: qty 100

## 2021-02-20 MED ORDER — PHENYLEPHRINE HCL (PRESSORS) 10 MG/ML IV SOLN
INTRAVENOUS | Status: DC | PRN
  Administered 2021-02-20 (×3): 200 ug via INTRAVENOUS

## 2021-02-20 MED ORDER — LIDOCAINE HCL (CARDIAC) PF 100 MG/5ML IV SOSY
PREFILLED_SYRINGE | INTRAVENOUS | Status: DC | PRN
  Administered 2021-02-20: 100 mg via INTRAVENOUS

## 2021-02-20 MED ORDER — CEFAZOLIN SODIUM-DEXTROSE 2-4 GM/100ML-% IV SOLN
2.0000 g | INTRAVENOUS | Status: AC
Start: 2021-02-20 — End: 2021-02-20
  Administered 2021-02-20: 2 g via INTRAVENOUS

## 2021-02-20 MED ORDER — ONDANSETRON HCL 4 MG/2ML IJ SOLN: 4.0000 mg | Freq: Once | INTRAMUSCULAR | Status: DC | PRN

## 2021-02-20 MED ORDER — PROPOFOL 10 MG/ML IV BOLUS
INTRAVENOUS | Status: AC
  Filled 2021-02-20: qty 60

## 2021-02-20 MED ORDER — SUGAMMADEX SODIUM 200 MG/2ML IV SOLN
INTRAVENOUS | Status: DC | PRN
  Administered 2021-02-20: 100 mg via INTRAVENOUS
  Administered 2021-02-20: 200 mg via INTRAVENOUS

## 2021-02-20 MED ORDER — DEXAMETHASONE SODIUM PHOSPHATE 10 MG/ML IJ SOLN
INTRAMUSCULAR | Status: AC
  Filled 2021-02-20: qty 1

## 2021-02-20 MED ORDER — DEXAMETHASONE SODIUM PHOSPHATE 10 MG/ML IJ SOLN
INTRAMUSCULAR | Status: DC | PRN
  Administered 2021-02-20: 5 mg via INTRAVENOUS

## 2021-02-20 MED ORDER — CEFAZOLIN SODIUM-DEXTROSE 2-4 GM/100ML-% IV SOLN
INTRAVENOUS | Status: AC
  Filled 2021-02-20: qty 100

## 2021-02-20 MED ORDER — SODIUM CHLORIDE 0.9 % IV SOLN: INTRAVENOUS | Status: DC

## 2021-02-20 MED ORDER — LIDOCAINE HCL URETHRAL/MUCOSAL 2 % EX GEL
CUTANEOUS | Status: DC | PRN
  Administered 2021-02-20: 1 via URETHRAL

## 2021-02-20 MED ORDER — ORAL CARE MOUTH RINSE: 15.0000 mL | Freq: Once | OROMUCOSAL | Status: AC

## 2021-02-20 SURGICAL SUPPLY — 29 items
BAG DRAIN CYSTO-URO LG1000N (MISCELLANEOUS) ×2 IMPLANT
BRUSH SCRUB EZ 1% IODOPHOR (MISCELLANEOUS) ×2 IMPLANT
CATH URET FLEX-TIP 2 LUMEN 10F (CATHETERS) IMPLANT
CATH URETL 5X70 OPEN END (CATHETERS) ×1 IMPLANT
CNTNR SPEC 2.5X3XGRAD LEK (MISCELLANEOUS)
CONT SPEC 4OZ STER OR WHT (MISCELLANEOUS)
CONT SPEC 4OZ STRL OR WHT (MISCELLANEOUS)
CONTAINER SPEC 2.5X3XGRAD LEK (MISCELLANEOUS) IMPLANT
DRAPE UTILITY 15X26 TOWEL STRL (DRAPES) ×2 IMPLANT
DRSG TEGADERM 2-3/8X2-3/4 SM (GAUZE/BANDAGES/DRESSINGS) ×2 IMPLANT
GLOVE SURG UNDER POLY LF SZ7.5 (GLOVE) ×2 IMPLANT
GOWN STRL REUS W/ TWL LRG LVL3 (GOWN DISPOSABLE) ×1 IMPLANT
GOWN STRL REUS W/ TWL XL LVL3 (GOWN DISPOSABLE) ×1 IMPLANT
GOWN STRL REUS W/TWL LRG LVL3 (GOWN DISPOSABLE) ×2
GOWN STRL REUS W/TWL XL LVL3 (GOWN DISPOSABLE) ×2
GUIDEWIRE STR DUAL SENSOR (WIRE) ×2 IMPLANT
INFUSOR MANOMETER BAG 3000ML (MISCELLANEOUS) ×2 IMPLANT
IV NS IRRIG 3000ML ARTHROMATIC (IV SOLUTION) ×2 IMPLANT
KIT TURNOVER CYSTO (KITS) ×2 IMPLANT
PACK CYSTO AR (MISCELLANEOUS) ×2 IMPLANT
SET CYSTO W/LG BORE CLAMP LF (SET/KITS/TRAYS/PACK) ×2 IMPLANT
SHEATH URETERAL 12FRX35CM (MISCELLANEOUS) IMPLANT
STENT URET 6FRX24 CONTOUR (STENTS) ×1 IMPLANT
STENT URET 6FRX26 CONTOUR (STENTS) IMPLANT
SURGILUBE 2OZ TUBE FLIPTOP (MISCELLANEOUS) ×2 IMPLANT
SYR 10ML LL (SYRINGE) ×2 IMPLANT
TRACTIP FLEXIVA PULSE ID 200 (Laser) ×1 IMPLANT
VALVE UROSEAL ADJ ENDO (VALVE) ×1 IMPLANT
WATER STERILE IRR 1000ML POUR (IV SOLUTION) ×2 IMPLANT

## 2021-02-20 NOTE — Telephone Encounter (Signed)
Pt was following up on his results, really stressed he wanted a call back by today.

## 2021-02-20 NOTE — Anesthesia Preprocedure Evaluation (Signed)
Anesthesia Evaluation  Patient identified by MRN, date of birth, ID band Patient awake    Reviewed: Allergy & Precautions, H&P , NPO status , Patient's Chart, lab work & pertinent test results  History of Anesthesia Complications Negative for: history of anesthetic complications  Airway Mallampati: III  TM Distance: <3 FB Neck ROM: Full   Comment: Large neck Dental  (+) Chipped,    Pulmonary sleep apnea , neg COPD, Patient abstained from smoking.Not current smoker, former smoker,  Chews tobacco   Pulmonary exam normal breath sounds clear to auscultation       Cardiovascular Exercise Tolerance: Good METShypertension, (-) angina+ CAD, + Past MI and + Cardiac Stents  negative cardio ROS  (-) dysrhythmias  Rhythm:regular Rate:Normal - Systolic murmurs Normal stress test, unremarkable echo 1 year ago   Neuro/Psych PSYCHIATRIC DISORDERS Anxiety Depression Cervical radiculopathy negative neurological ROS     GI/Hepatic Neg liver ROS, GERD  ,  Endo/Other  negative endocrine ROSneg diabetes  Renal/GU Renal diseasenegative Renal ROS     Musculoskeletal   Abdominal   Peds  Hematology negative hematology ROS (+)   Anesthesia Other Findings Past Medical History: No date: Arthritis No date: ASCVD (arteriosclerotic cardiovascular disease) No date: Cervical radiculopathy No date: Chest pain, unspecified No date: Coronary artery disease No date: Depression No date: Dysplastic nevus     Comment:  left ear - treated by Dr. Sharlett Iles No date: GAD (generalized anxiety disorder) No date: GAD (generalized anxiety disorder) No date: GERD (gastroesophageal reflux disease) No date: Gout No date: HLD (hyperlipidemia) No date: Hypertension No date: Kidney stone No date: Obesity No date: Pre-diabetes     Comment:  borderline. patient unaware of this, No date: Reflux No date: Sleep apnea     Comment:  CAN NOT USE HIS  CPAP 2016: STEMI (ST elevation myocardial infarction) ([place])     Comment:  2 STENTS No date: Vertigo     Comment:  none recently  Past Surgical History: 1993, 2005: [place]; Right     Comment:  1993 No date: APPENDECTOMY 2016: CARDIAC CATHETERIZATION     Comment:  stent No date: CHOLECYSTECTOMY 12/31/2019: COLONOSCOPY WITH PROPOFOL; N/A     Comment:  Procedure: COLONOSCOPY WITH PROPOFOL;  Surgeon: [name], MD;  Location: [facility];  Service:               Endoscopy;  Laterality: N/A; No date: CORONARY STENT PLACEMENT No date: EYE SURGERY; Left No date: TONSILLECTOMY No date: UPPER GI ENDOSCOPY  BMI    Body Mass Index: 34.95 kg/m      Reproductive/Obstetrics negative OB ROS                            Anesthesia Physical  Anesthesia Plan  ASA: III  Anesthesia Plan: General ETT   Post-op Pain Management:    Induction: Intravenous  PONV Risk Score and Plan: 3 and Ondansetron, Dexamethasone and Treatment may vary due to age or medical condition  Airway Management Planned: Oral ETT  Additional Equipment: None  Intra-op Plan:   Post-operative Plan: Extubation in OR  Informed Consent: I have reviewed the patients History and Physical, chart, labs and discussed the procedure including the risks, benefits and alternatives for the proposed anesthesia with the patient or authorized representative who has indicated his/her understanding and acceptance.  Dental Advisory Given  Plan Discussed with: Anesthesiologist, CRNA and Surgeon  Anesthesia Plan Comments: (Discussed risks of anesthesia with patient, including PONV, sore throat, lip/dental damage. Rare risks discussed as well, such as cardiorespiratory and neurological sequelae. Patient understands.)       Anesthesia Quick Evaluation

## 2021-02-20 NOTE — Anesthesia Procedure Notes (Signed)
Procedure Name: Intubation Date/Time: 02/20/2021 10:28 AM Performed by: Lowry Bowl, CRNA Pre-anesthesia Checklist: Patient identified, Emergency Drugs available, Suction available and Patient being monitored Patient Re-evaluated:Patient Re-evaluated prior to induction Oxygen Delivery Method: Circle system utilized Preoxygenation: Pre-oxygenation with 100% oxygen Induction Type: IV induction Ventilation: Mask ventilation without difficulty Laryngoscope Size: McGraph and 4 Grade View: Grade II Tube type: Oral Tube size: 7.5 mm Number of attempts: 1 Airway Equipment and Method: Stylet and Video-laryngoscopy Placement Confirmation: ETT inserted through vocal cords under direct vision,  positive ETCO2 and breath sounds checked- equal and bilateral Secured at: 22 cm Tube secured with: Tape Dental Injury: Teeth and Oropharynx as per pre-operative assessment

## 2021-02-20 NOTE — Discharge Instructions (Signed)

## 2021-02-20 NOTE — Transfer of Care (Signed)
Immediate Anesthesia Transfer of Care Note  Patient: Michael Leon  Procedure(s) Performed: CYSTOSCOPY/URETEROSCOPY/HOLMIUM LASER/STENT PLACEMENT (Right )  Patient Location: PACU  Anesthesia Type:General  Level of Consciousness: awake, drowsy and patient cooperative  Airway & Oxygen Therapy: Patient Spontanous Breathing and Patient connected to face mask oxygen  Post-op Assessment: Report given to RN and Post -op Vital signs reviewed and stable  Post vital signs: Reviewed and stable  Last Vitals:  Vitals Value Taken Time  BP 118/68 02/20/21 1100  Temp 36.1 C 02/20/21 1053  Pulse 81 02/20/21 1102  Resp 21 02/20/21 1102  SpO2 94 % 02/20/21 1102  Vitals shown include unvalidated device data.  Last Pain:  Vitals:   02/20/21 1053  TempSrc:   PainSc: 0-No pain         Complications: No complications documented.

## 2021-02-20 NOTE — Op Note (Signed)
Date of procedure: 02/20/21  Preoperative diagnosis:  1. Right ureteral stone  Postoperative diagnosis:  1. Same  Procedure: 1. Cystoscopy, right retrograde pyelogram with intraoperative interpretation, right ureteroscopy, laser lithotripsy, stent placement  Surgeon: Nickolas Madrid, MD  Anesthesia: General  Complications: None  Intraoperative findings:  1.  Normal cystoscopy 2.  Right distal ureteral stone dusted and fragments irrigated from ureter, stent placed with Dangler  EBL: Minimal  Specimens: None  Drains: Right 6 French by 24 cm ureteral stent with Dangler  Indication: Michael Leon is a 71 y.o. patient with 5 mm proximal ureteral stone who previously underwent shockwave lithotripsy and had persistent renal colic and microscopic hematuria, with KUB suggesting persistent stone fragments.  After reviewing the management options for treatment, they elected to proceed with the above surgical procedure(s). We have discussed the potential benefits and risks of the procedure, side effects of the proposed treatment, the likelihood of the patient achieving the goals of the procedure, and any potential problems that might occur during the procedure or recuperation. Informed consent has been obtained.  Description of procedure:  The patient was taken to the operating room and general anesthesia was induced. SCDs were placed for DVT prophylaxis. The patient was placed in the dorsal lithotomy position, prepped and draped in the usual sterile fashion, and preoperative antibiotics(Ancef) were administered. A preoperative time-out was performed.   A 21 French rigid cystoscope was used to intubate the urethra normal-appearing urethra was followed proximally into the bladder.  The prostate was moderate in size.  Thorough cystoscopy showed a normal bladder with no suspicious lesions.  A 5 French access catheter was used to intubate the right ureteral orifice and a retrograde pyelogram was  performed.  This showed a possible filling defect around the level of the iliacs with some mild hydroureteronephrosis upstream.  A sensor wire was advanced into the ureter and passed easily up to the kidney under fluoroscopic vision.  A semirigid ureteroscope advanced easily alongside the wire and identified a black hard stone at the level of the iliacs in the mid to distal ureter.  A 242 m laser fiber was advanced and the stone was fragmented to dust using settings of 0.5 J and 40 Hz.  These pieces were irrigated free from the ureter.  Thorough ureteroscopy demonstrated no residual fragments or injury.  Retrograde pyelogram performed from the mid ureter showed no extravasation or filling defects.  The rigid cystoscope was backloaded over the wire and a 6 [place] by 24 cm stent was uneventfully placed with an excellent curl in the upper pole, as well as under direct vision in the bladder.  The bladder was drained and the Dangler was secured to the dorsal aspect of the penis with Tegaderm.  Lidocaine was injected into the meatus, and a belladonna suppository was placed.  Disposition: Stable to PACU  Plan: Remove stent at home Tuesday morning with 1 dose of Bactrim prior  Nickolas Madrid, MD

## 2021-02-20 NOTE — Telephone Encounter (Signed)
Please advise results? 

## 2021-02-20 NOTE — H&P (Signed)
02/20/21 9:32 AM   Michael Leon 11-Feb-1951 [id number]  CC: Right ureteral stone  HPI: 70 year old male who presented with a 5 mm right proximal ureteral stone and originally underwent shockwave lithotripsy with Dr. Erlene Quan on 01/22/2021.  He was moving quite a bit during the procedure making targeting of the stone very challenging.  He continued to have intermittent right renal colic, and KUB suggested persistent proximal stone fragments, and urine showed persistent microscopic hematuria.  He opted for right ureteroscopy, laser lithotripsy, and stent.   PMH: Past Medical History:  Diagnosis Date  . Arthritis   . ASCVD (arteriosclerotic cardiovascular disease)   . Cervical radiculopathy   . Chest pain, unspecified   . Coronary artery disease   . Depression   . Dysplastic nevus    left ear - treated by Dr. Sharlett Iles  . GAD (generalized anxiety disorder)   . GAD (generalized anxiety disorder)   . GERD (gastroesophageal reflux disease)   . Gout   . HLD (hyperlipidemia)   . Hypertension   . Kidney stone   . Obesity   . Pre-diabetes    borderline. patient unaware of this,  . Reflux   . Sleep apnea    CAN NOT USE HIS CPAP  . STEMI (ST elevation myocardial infarction) ([place]) 2016   2 STENTS  . Vertigo    none recently    Surgical History: Past Surgical History:  Procedure Laterality Date  . ANTERIOR CRUCIATE LIGAMENT REPAIR Right 1993, 2005   1993  . APPENDECTOMY    . CARDIAC CATHETERIZATION  2016   stent  . CHOLECYSTECTOMY    . COLONOSCOPY WITH PROPOFOL N/A 12/31/2019   Procedure: COLONOSCOPY WITH PROPOFOL;  Surgeon: [name], MD;  Location: [facility];  Service: Endoscopy;  Laterality: N/A;  . CORONARY STENT PLACEMENT    . EXTRACORPOREAL SHOCK WAVE LITHOTRIPSY Right 01/22/2021   Procedure: EXTRACORPOREAL SHOCK WAVE LITHOTRIPSY (ESWL);  Surgeon: Hollice Espy, MD;  Location: [facility];  Service: Urology;  Laterality: Right;  . EYE SURGERY Left   .  SHOULDER ARTHROSCOPY WITH ROTATOR CUFF REPAIR AND SUBACROMIAL DECOMPRESSION Right 10/23/2020   Procedure: RIGHT SHOULDER ARTHROSCOPY WITH DEB RIDEMENT;  Surgeon: Thornton Park, MD;  Location: [facility];  Service: Orthopedics;  Laterality: Right;  . TONSILLECTOMY    . UPPER GI ENDOSCOPY        Family History: Family History  Problem Relation Age of Onset  . Cancer Mother        lung  . Stroke Mother   . Multiple sclerosis Mother   . Heart attack Father   . Bladder Cancer Maternal Grandfather   . Heart disease Maternal Grandfather   . Prostate cancer Neg Hx   . Kidney disease Neg Hx     Social History:  reports that he quit smoking about 18 years ago. His smoking use included cigarettes. He has a 22.50 pack-year smoking history. His smokeless tobacco use includes chew. He reports current alcohol use of about 7.0 - 14.0 standard drinks of alcohol per week. He reports that he does not use drugs.  Physical Exam: BP (!) 166/78   Pulse 72   Temp 98.1 F (36.7 C) (Temporal)   Resp 16   SpO2 98%    Constitutional:  Alert and oriented, No acute distress. Cardiovascular: Regular rate and rhythm Respiratory: Clear to auscultation bilaterally GI: Abdomen is soft, nontender, nondistended, no abdominal masses  Laboratory Data: Urinalysis 02/17/2021 with greater than 50 RBCs, but otherwise benign  Assessment & Plan:   70 year old male with a 5 mm proximal ureteral stone who underwent right shockwave lithotripsy with Dr. Erlene Quan on 01/22/2021 and has persistent right renal colic and microscopic hematuria with KUB suggesting residual proximal stone fragments.  We discussed options extensively including observation with medical expulsive therapy, repeat shockwave lithotripsy, or right ureteroscopy.  We specifically discussed the risks ureteroscopy including bleeding, infection/sepsis, stent related symptoms including flank pain/urgency/frequency/incontinence/dysuria, ureteral injury, inability to  access stone, or need for staged or additional procedures.  We also discussed the risks of possible negative right ureteroscopy.  Right ureteroscopy, laser lithotripsy, stent placement today  Nickolas Madrid, MD 02/20/2021  [facility] [address], [place], [place] [address] [phone]

## 2021-02-21 NOTE — Anesthesia Postprocedure Evaluation (Signed)
Anesthesia Post Note  Patient: Michael Leon  Procedure(s) Performed: CYSTOSCOPY/URETEROSCOPY/HOLMIUM LASER/STENT PLACEMENT (Right )  Patient location during evaluation: PACU Anesthesia Type: General Level of consciousness: awake and alert and oriented Pain management: pain level controlled Vital Signs Assessment: post-procedure vital signs reviewed and stable Respiratory status: spontaneous breathing Cardiovascular status: blood pressure returned to baseline Anesthetic complications: no   No complications documented.   Last Vitals:  Vitals:   02/20/21 1115 02/20/21 1209  BP: 116/79 125/68  Pulse: 74 71  Resp: 17 16  Temp: (!) 36.3 C (!) 36.1 C  SpO2: 95% 96%    Last Pain:  Vitals:   02/20/21 1209  TempSrc: Temporal  PainSc: 0-No pain                 Neeva Trew

## 2021-02-22 ENCOUNTER — Encounter: Payer: Self-pay | Admitting: Urology

## 2021-02-23 ENCOUNTER — Telehealth: Payer: Self-pay

## 2021-02-23 NOTE — Telephone Encounter (Signed)
Copied from [place] [id number]. Topic: Quick Communication - Lab Results (Clinic Use ONLY) >> Feb 18, 2021  2:02 PM Lennox Solders wrote: Pt is calling and would like someone to call him to go over blood work results also PSA >> Feb 20, 2021  2:18 PM Pawlus, Brayton Layman A wrote: Pt called back to follow up on lab results, please call back.

## 2021-02-24 ENCOUNTER — Telehealth: Payer: Self-pay

## 2021-02-24 MED ORDER — METFORMIN HCL 500 MG PO TABS: 500.0000 mg | ORAL_TABLET | Freq: Two times a day (BID) | ORAL | 3 refills | Status: DC

## 2021-02-24 NOTE — Telephone Encounter (Signed)
Incoming call on triage line from patient in regards to stent removal. Patient removed stent around 4 AM this morning and now is complaining of headache, diarrhea and sensitive skin. Patient denies fever though he cant be for sure because he does not have a thermometer. Asked patient if he took his Bactrim as advised before pulling his stent, patient says he forgot. Patient will take Bactrim this afternoon. Patient reports feeling better after taking ibuprofen, symptoms relieved.

## 2021-02-24 NOTE — Addendum Note (Signed)
Addended by: Sylvester Harder on: 02/24/2021 04:00 PM   Modules accepted: Orders

## 2021-02-25 ENCOUNTER — Encounter: Payer: Self-pay | Admitting: Emergency Medicine

## 2021-02-25 ENCOUNTER — Inpatient Hospital Stay
Admission: EM | Admit: 2021-02-25 | Discharge: 2021-02-27 | DRG: 872 | Disposition: A | Discharge status: Home / Self Care | Payer: Medicare Other | Attending: Hospitalist | Admitting: Hospitalist

## 2021-02-25 ENCOUNTER — Telehealth: Payer: Self-pay | Admitting: Family Medicine

## 2021-02-25 ENCOUNTER — Ambulatory Visit: Payer: Self-pay | Admitting: *Deleted

## 2021-02-25 ENCOUNTER — Other Ambulatory Visit: Payer: Self-pay

## 2021-02-25 ENCOUNTER — Ambulatory Visit (INDEPENDENT_AMBULATORY_CARE_PROVIDER_SITE_OTHER): Payer: Medicare Other | Admitting: Urology

## 2021-02-25 ENCOUNTER — Encounter: Payer: Self-pay | Admitting: Urology

## 2021-02-25 VITALS — BP 134/77 | HR 116 | Temp 101.5°F | Ht 64.0 in | Wt 195.0 lb

## 2021-02-25 DIAGNOSIS — Z7982 Long term (current) use of aspirin: Secondary | ICD-10-CM | POA: Diagnosis not present

## 2021-02-25 DIAGNOSIS — R197 Diarrhea, unspecified: Secondary | ICD-10-CM | POA: Diagnosis not present

## 2021-02-25 DIAGNOSIS — E860 Dehydration: Secondary | ICD-10-CM | POA: Diagnosis present

## 2021-02-25 DIAGNOSIS — Z87442 Personal history of urinary calculi: Secondary | ICD-10-CM | POA: Diagnosis not present

## 2021-02-25 DIAGNOSIS — A419 Sepsis, unspecified organism: Principal | ICD-10-CM | POA: Diagnosis present

## 2021-02-25 DIAGNOSIS — I252 Old myocardial infarction: Secondary | ICD-10-CM | POA: Diagnosis not present

## 2021-02-25 DIAGNOSIS — Z20822 Contact with and (suspected) exposure to covid-19: Secondary | ICD-10-CM | POA: Diagnosis present

## 2021-02-25 DIAGNOSIS — F17220 Nicotine dependence, chewing tobacco, uncomplicated: Secondary | ICD-10-CM | POA: Diagnosis present

## 2021-02-25 DIAGNOSIS — Z955 Presence of coronary angioplasty implant and graft: Secondary | ICD-10-CM

## 2021-02-25 DIAGNOSIS — N200 Calculus of kidney: Secondary | ICD-10-CM | POA: Diagnosis not present

## 2021-02-25 DIAGNOSIS — E785 Hyperlipidemia, unspecified: Secondary | ICD-10-CM | POA: Diagnosis present

## 2021-02-25 DIAGNOSIS — R509 Fever, unspecified: Secondary | ICD-10-CM | POA: Diagnosis not present

## 2021-02-25 DIAGNOSIS — N390 Urinary tract infection, site not specified: Secondary | ICD-10-CM | POA: Diagnosis present

## 2021-02-25 DIAGNOSIS — Z7984 Long term (current) use of oral hypoglycemic drugs: Secondary | ICD-10-CM | POA: Diagnosis not present

## 2021-02-25 DIAGNOSIS — N135 Crossing vessel and stricture of ureter without hydronephrosis: Secondary | ICD-10-CM | POA: Diagnosis not present

## 2021-02-25 DIAGNOSIS — F419 Anxiety disorder, unspecified: Secondary | ICD-10-CM | POA: Diagnosis present

## 2021-02-25 DIAGNOSIS — F32A Depression, unspecified: Secondary | ICD-10-CM | POA: Diagnosis present

## 2021-02-25 DIAGNOSIS — Z79899 Other long term (current) drug therapy: Secondary | ICD-10-CM | POA: Diagnosis not present

## 2021-02-25 DIAGNOSIS — N10 Acute tubulo-interstitial nephritis: Secondary | ICD-10-CM

## 2021-02-25 DIAGNOSIS — K219 Gastro-esophageal reflux disease without esophagitis: Secondary | ICD-10-CM | POA: Diagnosis present

## 2021-02-25 DIAGNOSIS — R7989 Other specified abnormal findings of blood chemistry: Secondary | ICD-10-CM | POA: Diagnosis present

## 2021-02-25 DIAGNOSIS — I10 Essential (primary) hypertension: Secondary | ICD-10-CM | POA: Diagnosis not present

## 2021-02-25 DIAGNOSIS — E119 Type 2 diabetes mellitus without complications: Secondary | ICD-10-CM | POA: Diagnosis present

## 2021-02-25 LAB — CBC WITH DIFFERENTIAL/PLATELET
Abs Immature Granulocytes: 0.22 10*3/uL — ABNORMAL HIGH (ref 0.00–0.07)
Basophils Absolute: 0.1 10*3/uL (ref 0.0–0.1)
Basophils Relative: 0 %
Eosinophils Absolute: 0 10*3/uL (ref 0.0–0.5)
Eosinophils Relative: 0 %
HCT: 40.6 % (ref 39.0–52.0)
Hemoglobin: 14.8 g/dL (ref 13.0–17.0)
Immature Granulocytes: 1 %
Lymphocytes Relative: 5 %
Lymphs Abs: 1.1 10*3/uL (ref 0.7–4.0)
MCH: 32.6 pg (ref 26.0–34.0)
MCHC: 36.5 g/dL — ABNORMAL HIGH (ref 30.0–36.0)
MCV: 89.4 fL (ref 80.0–100.0)
Monocytes Absolute: 2.2 10*3/uL — ABNORMAL HIGH (ref 0.1–1.0)
Monocytes Relative: 11 %
Neutro Abs: 17.3 10*3/uL — ABNORMAL HIGH (ref 1.7–7.7)
Neutrophils Relative %: 83 %
Platelets: 190 10*3/uL (ref 150–400)
RBC: 4.54 MIL/uL (ref 4.22–5.81)
RDW: 13.1 % (ref 11.5–15.5)
WBC: 20.9 10*3/uL — ABNORMAL HIGH (ref 4.0–10.5)
nRBC: 0 % (ref 0.0–0.2)

## 2021-02-25 LAB — COMPREHENSIVE METABOLIC PANEL
ALT: 15 U/L (ref 0–44)
AST: 17 U/L (ref 15–41)
Albumin: 3.4 g/dL — ABNORMAL LOW (ref 3.5–5.0)
Alkaline Phosphatase: 64 U/L (ref 38–126)
Anion gap: 12 (ref 5–15)
BUN: 20 mg/dL (ref 8–23)
CO2: 18 mmol/L — ABNORMAL LOW (ref 22–32)
Calcium: 8.6 mg/dL — ABNORMAL LOW (ref 8.9–10.3)
Chloride: 100 mmol/L (ref 98–111)
Creatinine, Ser: 1.34 mg/dL — ABNORMAL HIGH (ref 0.61–1.24)
GFR, Estimated: 58 mL/min — ABNORMAL LOW (ref >60)
Glucose, Bld: 157 mg/dL — ABNORMAL HIGH (ref 70–99)
Potassium: 3.4 mmol/L — ABNORMAL LOW (ref 3.5–5.1)
Sodium: 130 mmol/L — ABNORMAL LOW (ref 135–145)
Total Bilirubin: 1.4 mg/dL — ABNORMAL HIGH (ref 0.3–1.2)
Total Protein: 6.9 g/dL (ref 6.5–8.1)

## 2021-02-25 LAB — URINALYSIS, COMPLETE (UACMP) WITH MICROSCOPIC
Bilirubin Urine: NEGATIVE
Glucose, UA: NEGATIVE mg/dL
Ketones, ur: NEGATIVE mg/dL
Leukocytes,Ua: NEGATIVE
Nitrite: NEGATIVE
Protein, ur: 100 mg/dL — AB
RBC / HPF: >50 RBC/hpf — ABNORMAL HIGH (ref 0–5)
Specific Gravity, Urine: 1.023 (ref 1.005–1.030)
pH: 5 (ref 5.0–8.0)

## 2021-02-25 LAB — GLUCOSE, CAPILLARY: Glucose-Capillary: 143 mg/dL — ABNORMAL HIGH (ref 70–99)

## 2021-02-25 LAB — SPECIMEN STATUS REPORT

## 2021-02-25 LAB — RESP PANEL BY RT-PCR (FLU A&B, COVID) ARPGX2
Influenza A by PCR: NEGATIVE
Influenza B by PCR: NEGATIVE
SARS Coronavirus 2 by RT PCR: NEGATIVE

## 2021-02-25 LAB — PSA: Prostate Specific Ag, Serum: 1.8 ng/mL (ref 0.0–4.0)

## 2021-02-25 LAB — HEMOGLOBIN A1C
Hgb A1c MFr Bld: 7.4 % — ABNORMAL HIGH (ref 4.8–5.6)
Mean Plasma Glucose: 165.68 mg/dL

## 2021-02-25 LAB — LACTIC ACID, PLASMA
Lactic Acid, Venous: 1.6 mmol/L (ref 0.5–1.9)
Lactic Acid, Venous: 1.6 mmol/L (ref 0.5–1.9)

## 2021-02-25 MED ORDER — ATORVASTATIN CALCIUM 20 MG PO TABS
80.0000 mg | ORAL_TABLET | Freq: Every morning | ORAL | Status: DC
  Administered 2021-02-26 – 2021-02-27 (×2): 80 mg via ORAL
  Filled 2021-02-25 (×2): qty 4

## 2021-02-25 MED ORDER — INSULIN ASPART 100 UNIT/ML IJ SOLN
0.0000 [IU] | Freq: Three times a day (TID) | INTRAMUSCULAR | Status: DC
  Administered 2021-02-26 (×2): 1 [IU] via SUBCUTANEOUS
  Filled 2021-02-25 (×2): qty 1

## 2021-02-25 MED ORDER — CEFTRIAXONE SODIUM 1 G IJ SOLR
1.0000 g | Freq: Once | INTRAMUSCULAR | Status: AC
Start: 2021-02-25 — End: 2021-02-25
  Administered 2021-02-25: 1 g via INTRAMUSCULAR

## 2021-02-25 MED ORDER — ACETAMINOPHEN 325 MG PO TABS
650.0000 mg | ORAL_TABLET | Freq: Four times a day (QID) | ORAL | Status: DC | PRN
  Administered 2021-02-26 (×2): 650 mg via ORAL
  Filled 2021-02-25 (×2): qty 2

## 2021-02-25 MED ORDER — SODIUM CHLORIDE 0.9 % IV SOLN
1.0000 g | Freq: Once | INTRAVENOUS | Status: AC
  Administered 2021-02-25: 1 g via INTRAVENOUS
  Filled 2021-02-25: qty 10

## 2021-02-25 MED ORDER — HYDRALAZINE HCL 20 MG/ML IJ SOLN
10.0000 mg | Freq: Four times a day (QID) | INTRAMUSCULAR | Status: DC | PRN
Start: 2021-02-25 — End: 2021-02-27

## 2021-02-25 MED ORDER — ENOXAPARIN SODIUM 40 MG/0.4ML IJ SOSY
40.0000 mg | PREFILLED_SYRINGE | INTRAMUSCULAR | Status: DC
  Administered 2021-02-25 – 2021-02-26 (×2): 40 mg via SUBCUTANEOUS
  Filled 2021-02-25 (×2): qty 0.4

## 2021-02-25 MED ORDER — PANTOPRAZOLE SODIUM 40 MG PO TBEC
40.0000 mg | DELAYED_RELEASE_TABLET | Freq: Every day | ORAL | Status: DC
  Administered 2021-02-26 – 2021-02-27 (×2): 40 mg via ORAL
  Filled 2021-02-25 (×2): qty 1

## 2021-02-25 MED ORDER — SODIUM CHLORIDE 0.9 % IV SOLN
2.0000 g | INTRAVENOUS | Status: DC
  Administered 2021-02-26 – 2021-02-27 (×2): 2 g via INTRAVENOUS
  Filled 2021-02-25 (×2): qty 2

## 2021-02-25 MED ORDER — ASPIRIN EC 81 MG PO TBEC
81.0000 mg | DELAYED_RELEASE_TABLET | Freq: Every morning | ORAL | Status: DC
  Administered 2021-02-26 – 2021-02-27 (×2): 81 mg via ORAL
  Filled 2021-02-25 (×2): qty 1

## 2021-02-25 MED ORDER — KETOROLAC TROMETHAMINE 30 MG/ML IJ SOLN
15.0000 mg | Freq: Once | INTRAMUSCULAR | Status: AC
  Administered 2021-02-25: 15 mg via INTRAVENOUS
  Filled 2021-02-25: qty 1

## 2021-02-25 MED ORDER — ACETAMINOPHEN 650 MG RE SUPP: 650.0000 mg | Freq: Four times a day (QID) | RECTAL | Status: DC | PRN

## 2021-02-25 MED ORDER — BISACODYL 5 MG PO TBEC: 5.0000 mg | DELAYED_RELEASE_TABLET | Freq: Every day | ORAL | Status: DC | PRN

## 2021-02-25 MED ORDER — SENNOSIDES-DOCUSATE SODIUM 8.6-50 MG PO TABS: 1.0000 | ORAL_TABLET | Freq: Every evening | ORAL | Status: DC | PRN

## 2021-02-25 MED ORDER — PAROXETINE HCL 20 MG PO TABS
40.0000 mg | ORAL_TABLET | Freq: Every day | ORAL | Status: DC
  Administered 2021-02-26 – 2021-02-27 (×2): 40 mg via ORAL
  Filled 2021-02-25 (×3): qty 2

## 2021-02-25 MED ORDER — TAMSULOSIN HCL 0.4 MG PO CAPS
0.4000 mg | ORAL_CAPSULE | Freq: Every day | ORAL | Status: DC
  Administered 2021-02-26 – 2021-02-27 (×2): 0.4 mg via ORAL
  Filled 2021-02-25 (×3): qty 1

## 2021-02-25 MED ORDER — SODIUM CHLORIDE 0.9 % IV SOLN: INTRAVENOUS | Status: DC

## 2021-02-25 MED ORDER — SODIUM CHLORIDE 0.9 % IV BOLUS
1000.0000 mL | Freq: Once | INTRAVENOUS | Status: AC
  Administered 2021-02-25: 1000 mL via INTRAVENOUS

## 2021-02-25 NOTE — ED Notes (Signed)
Per hospitalist , repeat lactic acid not needed.

## 2021-02-25 NOTE — Telephone Encounter (Signed)
Patient had surgery on 02/20/2021 and [facility] stating he has had a fever of 102. Would you like to see him today or send him to ER?

## 2021-02-25 NOTE — Telephone Encounter (Signed)
Okay to add to my schedule and overbook with UA, vitals, temperature, thanks  Nickolas Madrid, MD 02/25/2021

## 2021-02-25 NOTE — ED Notes (Signed)
ED Provider at bedside. 

## 2021-02-25 NOTE — H&P (Addendum)
[facility] History and Physical  Michael Leon XTG:[id number] DOB: [birth year] DOA: 02/25/2021  Referring physician: ED  PCP: Jerrol Banana., MD   Patient is coming from: Home  Chief Complaint: Fever  HPI: Michael Leon is a 70 y.o. male with past medical history of arthritis, anxiety, chest pain, depression, gastric reflux, myocardial infarction with stent and hyperlipidemia presented to hospital with fever nausea and diarrhea.  Patient had kidney stone which was treated with lithotripsy a month back but subsequently needed a stent which was removed 2 days back.  Since then having intermittent lower abdominal pain urinary urgency frequency.  He did have a fever up to 102 F yesterday.  He also complained of frequent episodes of diarrhea a day before stent was removed.  Patient denied any cough, runny nose, sore throat, chest pain or shortness of breath.  Denies any loss of consciousness.  Denies recent travel or sick contacts.  ED Course: In the ED, patient was noted to be febrile with a temperature of 101.5 F, was tachycardic cytosis at 20.9.  Was 3.4 and sodium was 130.  Creatinine was slightly elevated at 1.3.  Lactate was normal.  UA showed hemoglobin and protein with more than 50 RBCs.  White cells were 11-20.  Urology on-call Dr. Erlene Quan was notified from the ED who suggested IV antibiotics.  Patient was then considered for admission to the hospital.  Blood cultures and urine cultures were sent and patient was started on IV Rocephin.  Review of Systems:  All systems were reviewed and were negative unless otherwise mentioned in the HPI  Past Medical History:  Diagnosis Date  . Arthritis   . ASCVD (arteriosclerotic cardiovascular disease)   . Cervical radiculopathy   . Chest pain, unspecified   . Coronary artery disease   . Depression   . Dysplastic nevus    left ear - treated by Dr. Sharlett Iles  . GAD (generalized anxiety disorder)   . GAD (generalized anxiety  disorder)   . GERD (gastroesophageal reflux disease)   . Gout   . HLD (hyperlipidemia)   . Hypertension   . Kidney stone   . Obesity   . Pre-diabetes    borderline. patient unaware of this,  . Reflux   . Sleep apnea    CAN NOT USE HIS CPAP  . STEMI (ST elevation myocardial infarction) ([place]) 2016   2 STENTS  . Vertigo    none recently   Past Surgical History:  Procedure Laterality Date  . ANTERIOR CRUCIATE LIGAMENT REPAIR Right 1993, 2005   1993  . APPENDECTOMY    . CARDIAC CATHETERIZATION  2016   stent  . CHOLECYSTECTOMY    . COLONOSCOPY WITH PROPOFOL N/A 12/31/2019   Procedure: COLONOSCOPY WITH PROPOFOL;  Surgeon: [name], MD;  Location: [facility];  Service: Endoscopy;  Laterality: N/A;  . CORONARY STENT PLACEMENT    . CYSTOSCOPY/URETEROSCOPY/HOLMIUM LASER/STENT PLACEMENT Right 02/20/2021   Procedure: CYSTOSCOPY/URETEROSCOPY/HOLMIUM LASER/STENT PLACEMENT;  Surgeon: Billey Co, MD;  Location: [facility];  Service: Urology;  Laterality: Right;  . EXTRACORPOREAL SHOCK WAVE LITHOTRIPSY Right 01/22/2021   Procedure: EXTRACORPOREAL SHOCK WAVE LITHOTRIPSY (ESWL);  Surgeon: Hollice Espy, MD;  Location: [facility];  Service: Urology;  Laterality: Right;  . EYE SURGERY Left   . SHOULDER ARTHROSCOPY WITH ROTATOR CUFF REPAIR AND SUBACROMIAL DECOMPRESSION Right 10/23/2020   Procedure: RIGHT SHOULDER ARTHROSCOPY WITH DEB RIDEMENT;  Surgeon: Thornton Park, MD;  Location: [facility];  Service: Orthopedics;  Laterality: Right;  .  TONSILLECTOMY    . UPPER GI ENDOSCOPY      Social History:  reports that he quit smoking about 18 years ago. His smoking use included cigarettes. He has a 22.50 pack-year smoking history. His smokeless tobacco use includes chew. He reports current alcohol use of about 7.0 - 14.0 standard drinks of alcohol per week. He reports that he does not use drugs.  Allergies  Allergen Reactions  . Hydrocodone Other (See Comments)    Cold sweats   . Codeine  Other (See Comments)    Cold sweats (30 years ago) but has taken since with no reaction    Family History  Problem Relation Age of Onset  . Cancer Mother        lung  . Stroke Mother   . Multiple sclerosis Mother   . Heart attack Father   . Bladder Cancer Maternal Grandfather   . Heart disease Maternal Grandfather   . Prostate cancer Neg Hx   . Kidney disease Neg Hx      Prior to Admission medications   Medication Sig Start Date End Date Taking? Authorizing Provider  aspirin EC 81 MG tablet Take 81 mg by mouth in the morning. Swallow whole.    [provider]  atorvastatin (LIPITOR) 80 MG tablet Take 80 mg by mouth in the morning. 08/27/19   [provider]  ketorolac (TORADOL) 10 MG tablet Take 1 tablet (10 mg total) by mouth every 6 (six) hours as needed. 02/11/21   Jerrol Banana., MD  losartan (COZAAR) 50 MG tablet Take 1 tablet (50 mg total) by mouth daily. 06/26/18   Jerrol Banana., MD  metFORMIN (GLUCOPHAGE) 500 MG tablet Take 1 tablet (500 mg total) by mouth 2 (two) times daily with a meal. 02/24/21   Jerrol Banana., MD  pantoprazole (PROTONIX) 40 MG tablet TAKE 1 TABLET(40 MG) BY MOUTH TWICE DAILY 02/19/20   Jerrol Banana., MD  PARoxetine (PAXIL) 40 MG tablet TAKE 1 TABLET(40 MG) BY MOUTH DAILY 08/13/20   Jerrol Banana., MD  tamsulosin (FLOMAX) 0.4 MG CAPS capsule Take 1 capsule (0.4 mg total) by mouth daily. 02/03/21   Billey Co, MD    Physical Exam: Vitals:   02/25/21 1504 02/25/21 1506  BP:  133/90  Pulse:  (!) 102  Resp:  18  Temp:  97.9 F (36.6 C)  TempSrc:  Oral  SpO2:  96%  Weight: 90.7 kg   Height: _0  (1.651 m)    Wt Readings from Last 3 Encounters:  02/25/21 90.7 kg  02/25/21 88.5 kg  02/17/21 93.4 kg   Body mass index is 33.28 kg/m.  General: Obese built, not in obvious distress HENT: Normocephalic, pupils equally reacting to light and accommodation.  No scleral pallor or icterus noted.  Oral mucosa is moist.  Chest:  Clear breath sounds.  Diminished breath sounds bilaterally. No crackles or wheezes.  CVS: S1 &S2 heard. No murmur.  Regular rate and rhythm. Abdomen: Soft, nontender, nondistended.  Obese abdomen, bowel sounds are heard.  Liver is not palpable, no abdominal mass palpated Extremities: No cyanosis, clubbing or edema.  Peripheral pulses are palpable. Psych: Alert, awake and oriented, normal mood  CNS:  No cranial nerve deficits.  Power equal in all extremities.   Skin: Warm and dry.  No rashes noted.  Labs on Admission:   CBC: Recent Labs  Lab 02/25/21 1500  WBC 20.9*  NEUTROABS 17.3*  HGB 14.8  HCT 40.6  MCV 89.4  PLT 638    Basic Metabolic Panel: Recent Labs  Lab 02/25/21 1500  NA 130*  K 3.4*  CL 100  CO2 18*  GLUCOSE 157*  BUN 20  CREATININE 1.34*  CALCIUM 8.6*    Liver Function Tests: Recent Labs  Lab 02/25/21 1500  AST 17  ALT 15  ALKPHOS 64  BILITOT 1.4*  PROT 6.9  ALBUMIN 3.4*   No results for input(s): LIPASE, AMYLASE in the last 168 hours. No results for input(s): AMMONIA in the last 168 hours.  Cardiac Enzymes: No results for input(s): CKTOTAL, CKMB, CKMBINDEX, TROPONINI in the last 168 hours.  BNP (last 3 results) No results for input(s): BNP in the last 8760 hours.  ProBNP (last 3 results) No results for input(s): PROBNP in the last 8760 hours.  CBG: No results for input(s): GLUCAP in the last 168 hours.  Lipase  No results found for: LIPASE   Urinalysis    Component Value Date/Time   COLORURINE YELLOW (A) 02/25/2021 1500   APPEARANCEUR HAZY (A) 02/25/2021 1500   LABSPEC 1.023 02/25/2021 1500   PHURINE 5.0 02/25/2021 1500   GLUCOSEU NEGATIVE 02/25/2021 1500   HGBUR LARGE (A) 02/25/2021 1500   BILIRUBINUR NEGATIVE 02/25/2021 1500   BILIRUBINUR negative 01/29/2020 1004   KETONESUR NEGATIVE 02/25/2021 1500   PROTEINUR 100 (A) 02/25/2021 1500   UROBILINOGEN 0.2 01/29/2020 1004   NITRITE NEGATIVE  02/25/2021 1500   LEUKOCYTESUR NEGATIVE 02/25/2021 1500     Drugs of Abuse  No results found for: LABOPIA, [place], [place], [place], [facility], [place]    Radiological Exams on Admission: No results found.  EKG: Personally reviewed by me which shows normal sinus rhythm  Assessment/Plan  Active Problems:   Sepsis ([place])  Sepsis secondary to possible acute pyelonephritis.   Patient likely met criteria for sepsis including significant leukocytosis fever tachycardia with UTI being the source of infection with mildly elevated creatinine..  Patient with recent urologic intervention and stent removal.  Urology has been notified.  We will continue IV Rocephin for now.  Follow-up urine cultures,blood cultures white cells.  Continue supportive care.  Patient received septic protocol in the ED.  Lactate was normal.  Hemodynamically stable at this time.  Diabetes mellitus type 2.  Metformin at home.  Will hold.  Continue sliding scale insulin, Accu-Cheks diabetic diet.  Hemoglobin A1c was 7.2, 8 days back.  Patient states that he is a new diabetic.  We will focus on diabetic education while in the hospital.  Mild elevated creatinine.  Could be secondary to sepsis.  Continue hydration.  Hold losartan for now.  As needed hydralazine.  DVT Prophylaxis: Lovenox subcu  Consultant: [facility] [place] urology Dr. Erlene Quan was notified from the ED  Code Status: Full code  Microbiology urine cultures, blood culture sent from the ED  Antibiotics: IV Rocephin  Family Communication:  Patients' condition and plan of care including tests being ordered have been discussed with the patient who indicate understanding and agree with the plan.   Status is: Inpatient  Remains inpatient appropriate because:IV treatments appropriate due to intensity of illness or inability to take PO and Inpatient level of care appropriate due to severity of illness   Dispo: The patient is from: Home              Anticipated d/c is  to: Home              Patient currently is not medically stable to d/c.  Difficult to place patient No  Severity of Illness: The appropriate patient status for this patient is INPATIENT. Inpatient status is judged to be reasonable and necessary in order to provide the required intensity of service to ensure the patient's safety. The patient's presenting symptoms, physical exam findings, and initial radiographic and laboratory data in the context of their chronic comorbidities is felt to place them at high risk for further clinical deterioration. Furthermore, it is not anticipated that the patient will be medically stable for discharge from the hospital within 2 midnights of admission. TI certify that at the point of admission it is my clinical judgment that the patient will require inpatient hospital care spanning beyond 2 midnights from the point of admission due to high intensity of service, high risk for further deterioration and high frequency of surveillance required.  Signed, Flora Lipps, MD [facility] 02/25/2021

## 2021-02-25 NOTE — Progress Notes (Signed)
   02/25/2021 2:44 PM   Michael Leon 04-18-1951 [id number]  Reason for visit: Fever  HPI: 70 year old male added on to clinic today for fever of 102.  He underwent right ureteroscopy, laser lithotripsy, and stent placement on [place] on 02/20/2021 for a 5 mm distal ureteral stone.  He previously failed shockwave lithotripsy.  He removed his stent at home on Tuesday, and did not take the Bactrim antibiotic prior.  He reports 36 hours of fevers, chills, malaise.  He denies any flank pain or gross hematuria.  Urinalysis today suspicious for infection with 11-30 WBCs and 11-30 RBCs, no bacteria, nitrate negative, no leukocytes.  Sent for culture.  He is febrile to 101.5 with blood pressure 134/77 and tachycardic to 116.  Suspect UTI/early sepsis from urinary source.  1 g ceftriaxone given in clinic today.  I recommended evaluation in the ED for low blood cultures, broad-spectrum antibiotics and resuscitation, and likely admission.  He is in agreement.    -Would not recommend any further imaging at this time with his lack of flank pain, consider renal ultrasound if new flank pain or no improvement with antibiotics and resuscitation -Continue broad-spectrum antibiotics and resuscitation  I discussed his case with Dr. Vladimir Crofts in the ED on his impending arrival to the ED.  Billey Co, [facility] [address] [place], [place] [address] [phone]

## 2021-02-25 NOTE — Addendum Note (Signed)
Addended by: Despina Hidden on: 02/25/2021 04:11 PM   Modules accepted: Orders

## 2021-02-25 NOTE — Telephone Encounter (Signed)
I cannot tell if he was actually seen at [facility] urology or not.  We cannot see him but he might be able to be seen in urgent care for the fever and headache.  I would actually start with a COVID test if he does not want to do that.  [facility] is where I would have him do that, a rapid PCR

## 2021-02-25 NOTE — ED Provider Notes (Addendum)
[facility] Emergency Department Provider Note  Time seen: 3:23 PM  I have reviewed the triage vital signs and the nursing notes.   HISTORY  Chief Complaint Fever   HPI Michael Leon is a 70 y.o. male with a past medical history of arthritis, anxiety, chest pain, depression, gastric reflux, hypertension, hyperlipidemia, past MI with stents, presents to the emergency department for fever nausea diarrhea.  According to the patient he had a kidney stone approximate 1 month ago, he had a lithotripsy performed and and a stent placed.  Patient states the stent was removed this past Tuesday.  Since then he has been experiencing intermittent lower abdominal pain, urinary urgency/frequency and now fever to 102 last night.  Patient states he has been nauseated with frequent episodes of diarrhea.  No cough or shortness of breath.  Past Medical History:  Diagnosis Date  . Arthritis   . ASCVD (arteriosclerotic cardiovascular disease)   . Cervical radiculopathy   . Chest pain, unspecified   . Coronary artery disease   . Depression   . Dysplastic nevus    left ear - treated by Dr. Sharlett Iles  . GAD (generalized anxiety disorder)   . GAD (generalized anxiety disorder)   . GERD (gastroesophageal reflux disease)   . Gout   . HLD (hyperlipidemia)   . Hypertension   . Kidney stone   . Obesity   . Pre-diabetes    borderline. patient unaware of this,  . Reflux   . Sleep apnea    CAN NOT USE HIS CPAP  . STEMI (ST elevation myocardial infarction) ([place]) 2016   2 STENTS  . Vertigo    none recently    Patient Active Problem List   Diagnosis Date Noted  . Personal history of colonic polyps   . Chest pain 12/30/2018  . Breathlessness on exertion 06/05/2015  . Dizziness 05/08/2015  . Arteriosclerosis of coronary artery 05/07/2015  . HLD (hyperlipidemia) 05/07/2015  . BP (high blood pressure) 05/07/2015  . Obesity 05/07/2015  . Depression 05/07/2015  . GAD (generalized  anxiety disorder) 05/07/2015  . Insomnia 05/07/2015  . GERD (gastroesophageal reflux disease) 05/07/2015  . Gout 05/07/2015  . Cervical radiculopathy 05/07/2015  . Prediabetes 05/07/2015  . History of kidney stones 05/07/2015  . Benign essential HTN 05/05/2015  . Anxiety 04/30/2015  . Borderline diabetes 04/30/2015  . ST elevation (STEMI) myocardial infarction ([place]) 04/30/2015  . Blood glucose elevated 04/30/2015  . CHEST PAIN UNSPECIFIED 06/19/2009    Past Surgical History:  Procedure Laterality Date  . ANTERIOR CRUCIATE LIGAMENT REPAIR Right 1993, 2005   1993  . APPENDECTOMY    . CARDIAC CATHETERIZATION  2016   stent  . CHOLECYSTECTOMY    . COLONOSCOPY WITH PROPOFOL N/A 12/31/2019   Procedure: COLONOSCOPY WITH PROPOFOL;  Surgeon: [name], MD;  Location: [facility];  Service: Endoscopy;  Laterality: N/A;  . CORONARY STENT PLACEMENT    . CYSTOSCOPY/URETEROSCOPY/HOLMIUM LASER/STENT PLACEMENT Right 02/20/2021   Procedure: CYSTOSCOPY/URETEROSCOPY/HOLMIUM LASER/STENT PLACEMENT;  Surgeon: Billey Co, MD;  Location: [facility];  Service: Urology;  Laterality: Right;  . EXTRACORPOREAL SHOCK WAVE LITHOTRIPSY Right 01/22/2021   Procedure: EXTRACORPOREAL SHOCK WAVE LITHOTRIPSY (ESWL);  Surgeon: Hollice Espy, MD;  Location: [facility];  Service: Urology;  Laterality: Right;  . EYE SURGERY Left   . SHOULDER ARTHROSCOPY WITH ROTATOR CUFF REPAIR AND SUBACROMIAL DECOMPRESSION Right 10/23/2020   Procedure: RIGHT SHOULDER ARTHROSCOPY WITH DEB RIDEMENT;  Surgeon: Thornton Park, MD;  Location: [facility];  Service: Orthopedics;  Laterality: Right;  . TONSILLECTOMY    . UPPER GI ENDOSCOPY      Prior to Admission medications   Medication Sig Start Date End Date Taking? Authorizing Provider  aspirin EC 81 MG tablet Take 81 mg by mouth in the morning. Swallow whole.    [provider]  atorvastatin (LIPITOR) 80 MG tablet Take 80 mg by mouth in the morning. 08/27/19   [provider]  ketorolac (TORADOL) 10 MG tablet Take 1 tablet (10 mg total) by mouth every 6 (six) hours as needed. 02/11/21   Jerrol Banana., MD  losartan (COZAAR) 50 MG tablet Take 1 tablet (50 mg total) by mouth daily. 06/26/18   Jerrol Banana., MD  metFORMIN (GLUCOPHAGE) 500 MG tablet Take 1 tablet (500 mg total) by mouth 2 (two) times daily with a meal. 02/24/21   Jerrol Banana., MD  pantoprazole (PROTONIX) 40 MG tablet TAKE 1 TABLET(40 MG) BY MOUTH TWICE DAILY 02/19/20   Jerrol Banana., MD  PARoxetine (PAXIL) 40 MG tablet TAKE 1 TABLET(40 MG) BY MOUTH DAILY 08/13/20   Jerrol Banana., MD  tamsulosin (FLOMAX) 0.4 MG CAPS capsule Take 1 capsule (0.4 mg total) by mouth daily. 02/03/21   Billey Co, MD    Allergies  Allergen Reactions  . Hydrocodone Other (See Comments)    Cold sweats   . Codeine Other (See Comments)    Cold sweats (30 years ago) but has taken since with no reaction    Family History  Problem Relation Age of Onset  . Cancer Mother        lung  . Stroke Mother   . Multiple sclerosis Mother   . Heart attack Father   . Bladder Cancer Maternal Grandfather   . Heart disease Maternal Grandfather   . Prostate cancer Neg Hx   . Kidney disease Neg Hx     Social History Social History   Tobacco Use  . Smoking status: Former Smoker    Packs/day: 1.50    Years: 15.00    Pack years: 22.50    Types: Cigarettes    Quit date: 10/18/2002    Years since quitting: 18.3  . Smokeless tobacco: Current User    Types: Chew  . Tobacco comment: Dips  Vaping Use  . Vaping Use: Never used  Substance Use Topics  . Alcohol use: Yes    Alcohol/week: 7.0 - 14.0 standard drinks    Types: 7 - 14 Cans of beer per week    Comment: 1-2 "lite" beers per day  . Drug use: No    Review of Systems Constitutional: Negative for fever. Cardiovascular: Negative for chest pain. Respiratory: Negative for shortness of breath. Gastrointestinal: Lower  abdominal discomfort.  Positive nausea.  Positive for diarrhea. Genitourinary: Urinary urgency and frequency Musculoskeletal: Negative for musculoskeletal complaints Neurological: Negative for headache All other ROS negative  ____________________________________________   PHYSICAL EXAM:  VITAL SIGNS: ED Triage Vitals  Enc Vitals Group     BP 02/25/21 1506 133/90     Pulse Rate 02/25/21 1506 (!) 102     Resp 02/25/21 1506 18     Temp 02/25/21 1506 97.9 F (36.6 C)     Temp Source 02/25/21 1506 Oral     SpO2 02/25/21 1506 96 %     Weight 02/25/21 1504 200 lb (90.7 kg)     Height 02/25/21 1504 5\' 5"  (1.651 m)  Head Circumference --      Peak Flow --      Pain Score 02/25/21 1503 1     Pain Loc --      Pain Edu? --      Excl. in [place]? --     Constitutional: Alert and oriented. Well appearing and in no distress. Eyes: Normal exam ENT      Head: Normocephalic and atraumatic.      Mouth/Throat: Mucous membranes are moist. Cardiovascular: Normal rate, regular rhythm. Respiratory: Normal respiratory effort without tachypnea nor retractions. Breath sounds are clear Gastrointestinal: Soft and nontender. No distention.  Musculoskeletal: Nontender with normal range of motion in all extremities.  Neurologic:  Normal speech and language. No gross focal neurologic deficits Skin:  Skin is warm, dry and intact.  Psychiatric: Mood and affect are normal.  ____________________________________________    EKG  EKG viewed and interpreted by myself shows a sinus rhythm at 99 bpm with a narrow QRS, normal axis, normal intervals, nonspecific ST changes.  ____________________________________________    INITIAL IMPRESSION / ASSESSMENT AND PLAN / ED COURSE  Pertinent labs & imaging results that were available during my care of the patient were reviewed by me and considered in my medical decision making (see chart for details).   Patient presents emergency department with reports of  fever at home as high as 102.4, nausea, diarrhea, abdominal discomfort and urinary urgency/frequency.  Patient had a ureteral stent pulled yesterday.  We will check labs, urinalysis, urine culture, blood cultures and reassess.  Patient agreeable to plan of care.  We will treat with normal saline and Toradol.  Patient's white blood cell count is 20,900 with mild tachycardia and reported fever at home meeting sepsis criteria.  Patient is already receiving IV antibiotics we will continue with IV fluids and admit to the hospital service.  Spoke to Dr. Erlene Quan of urology who states they will follow along with the patient and if he decompensates may require replacement of ureteral stent.  Michael Leon was evaluated in Emergency Department on 02/25/2021 for the symptoms described in the history of present illness. He was evaluated in the context of the global COVID-19 pandemic, which necessitated consideration that the patient might be at risk for infection with the SARS-CoV-2 virus that causes COVID-19. Institutional protocols and algorithms that pertain to the evaluation of patients at risk for COVID-19 are in a state of rapid change based on information released by regulatory bodies including the CDC and federal and state organizations. These policies and algorithms were followed during the patient's care in the ED.  CRITICAL CARE Performed by: Harvest Dark   Total critical care time: 30 minutes  Critical care time was exclusive of separately billable procedures and treating other patients.  Critical care was necessary to treat or prevent imminent or life-threatening deterioration.  Critical care was time spent personally by me on the following activities: development of treatment plan with patient and/or surrogate as well as nursing, discussions with consultants, evaluation of patient's response to treatment, examination of patient, obtaining history from patient or surrogate, ordering and  performing treatments and interventions, ordering and review of laboratory studies, ordering and review of radiographic studies, pulse oximetry and re-evaluation of patient's condition.  ____________________________________________   FINAL CLINICAL IMPRESSION(S) / ED DIAGNOSES  Fever Urinary infection Sepsis   Harvest Dark, MD 02/25/21 1714    Harvest Dark, MD 02/25/21 1715

## 2021-02-25 NOTE — Sepsis Progress Note (Signed)
eLink is following this Code Sepsis. °

## 2021-02-25 NOTE — Telephone Encounter (Signed)
Pt called stating he has a temp of 102..0, diarrhea, and headache; the pt says he had a stent that he removed PM 02/24/21; he contacted Urology AM 02/25/21 and was told to contact his PCP; office notes from [facility] reflect pt scheduled for appt at 1300 02/25/21; pt says he was not notified; spoke with Angie at [facility] and she says the pt should come to the appt now; pt notified and given the address [place] ([place] behind [facility]); he verbalized understanding; the pt is seen by Dr Miguel Aschoff, [facility]; will route to office for notification. Reason for Disposition . Requesting regular office appointment  Answer Assessment - Initial Assessment Questions 1. REASON FOR CALL or QUESTION: "What is your reason for calling today?" or "How can I best help you?" or "What question do you have that I can help answer?"     Appt for fever; see previous encounter with [facility]  Protocols used: [place]

## 2021-02-25 NOTE — ED Notes (Signed)
Joneen Caraway RN aware of assigned bed

## 2021-02-25 NOTE — Telephone Encounter (Signed)
Appointment made

## 2021-02-25 NOTE — ED Triage Notes (Signed)
Pt comes into the ED via POV c/o fever, weakness, and possible kidney infection.  Pt had a stent placed last week for a kidney stone and the stent has since then been removed.  Pt states his temp was 104.2 last night.  Pt currently presents with even and unlabored respirations.

## 2021-02-25 NOTE — Progress Notes (Signed)
CODE SEPSIS - PHARMACY COMMUNICATION  **Broad Spectrum Antibiotics should be administered within 1 hour of Sepsis diagnosis**  Time Code Sepsis Called/Page Received: 1718  Antibiotics Ordered: Rocephin  Time of 1st antibiotic administration: 6384  Additional action taken by pharmacy: none  If necessary, Name of Provider/Nurse Contacted: n/a    Pearla Dubonnet ,PharmD Clinical Pharmacist  02/25/2021  5:56 PM

## 2021-02-26 DIAGNOSIS — R197 Diarrhea, unspecified: Secondary | ICD-10-CM | POA: Diagnosis not present

## 2021-02-26 DIAGNOSIS — A419 Sepsis, unspecified organism: Secondary | ICD-10-CM | POA: Diagnosis not present

## 2021-02-26 LAB — COMPREHENSIVE METABOLIC PANEL
ALT: 12 U/L (ref 0–44)
AST: 17 U/L (ref 15–41)
Albumin: 3 g/dL — ABNORMAL LOW (ref 3.5–5.0)
Alkaline Phosphatase: 60 U/L (ref 38–126)
Anion gap: 11 (ref 5–15)
BUN: 22 mg/dL (ref 8–23)
CO2: 19 mmol/L — ABNORMAL LOW (ref 22–32)
Calcium: 8 mg/dL — ABNORMAL LOW (ref 8.9–10.3)
Chloride: 100 mmol/L (ref 98–111)
Creatinine, Ser: 1.41 mg/dL — ABNORMAL HIGH (ref 0.61–1.24)
GFR, Estimated: 54 mL/min — ABNORMAL LOW (ref >60)
Glucose, Bld: 138 mg/dL — ABNORMAL HIGH (ref 70–99)
Potassium: 3.2 mmol/L — ABNORMAL LOW (ref 3.5–5.1)
Sodium: 130 mmol/L — ABNORMAL LOW (ref 135–145)
Total Bilirubin: 0.9 mg/dL (ref 0.3–1.2)
Total Protein: 6.2 g/dL — ABNORMAL LOW (ref 6.5–8.1)

## 2021-02-26 LAB — MICROSCOPIC EXAMINATION: Bacteria, UA: NONE SEEN

## 2021-02-26 LAB — URINALYSIS, COMPLETE
Bilirubin, UA: NEGATIVE
Ketones, UA: NEGATIVE
Leukocytes,UA: NEGATIVE
Nitrite, UA: NEGATIVE
Specific Gravity, UA: 1.02 (ref 1.005–1.030)
Urobilinogen, Ur: 0.2 mg/dL (ref 0.2–1.0)
pH, UA: 5.5 (ref 5.0–7.5)

## 2021-02-26 LAB — MAGNESIUM: Magnesium: 1.7 mg/dL (ref 1.7–2.4)

## 2021-02-26 LAB — GASTROINTESTINAL PANEL BY PCR, STOOL (REPLACES STOOL CULTURE)

## 2021-02-26 LAB — GLUCOSE, CAPILLARY
Glucose-Capillary: 131 mg/dL — ABNORMAL HIGH (ref 70–99)
Glucose-Capillary: 128 mg/dL — ABNORMAL HIGH (ref 70–99)
Glucose-Capillary: 119 mg/dL — ABNORMAL HIGH (ref 70–99)
Glucose-Capillary: 144 mg/dL — ABNORMAL HIGH (ref 70–99)

## 2021-02-26 LAB — C DIFFICILE QUICK SCREEN W PCR REFLEX
C Diff antigen: NEGATIVE
C Diff interpretation: NOT DETECTED
C Diff toxin: NEGATIVE

## 2021-02-26 LAB — CBC
HCT: 36.3 % — ABNORMAL LOW (ref 39.0–52.0)
Hemoglobin: 12.9 g/dL — ABNORMAL LOW (ref 13.0–17.0)
MCH: 32 pg (ref 26.0–34.0)
MCHC: 35.5 g/dL (ref 30.0–36.0)
MCV: 90.1 fL (ref 80.0–100.0)
Platelets: 181 10*3/uL (ref 150–400)
RBC: 4.03 MIL/uL — ABNORMAL LOW (ref 4.22–5.81)
RDW: 13.1 % (ref 11.5–15.5)
WBC: 17.8 10*3/uL — ABNORMAL HIGH (ref 4.0–10.5)
nRBC: 0 % (ref 0.0–0.2)

## 2021-02-26 LAB — PHOSPHORUS: Phosphorus: 2.9 mg/dL (ref 2.5–4.6)

## 2021-02-26 MED ORDER — HYDROXYZINE HCL 25 MG PO TABS
25.0000 mg | ORAL_TABLET | Freq: Every evening | ORAL | Status: DC | PRN
  Administered 2021-02-26: 25 mg via ORAL
  Filled 2021-02-26 (×2): qty 1

## 2021-02-26 MED ORDER — LIVING WELL WITH DIABETES BOOK
Freq: Once | Status: AC
  Filled 2021-02-26: qty 1

## 2021-02-26 NOTE — Plan of Care (Signed)

## 2021-02-26 NOTE — Telephone Encounter (Signed)
Patient went to ED yesterday and was admitted.

## 2021-02-26 NOTE — Consult Note (Signed)
Urology Consult  I have been asked to see the patient by Dr. Kerman Passey, for evaluation and management of febrile UTI.  Chief Complaint: Fever  History of Present Illness: Michael Leon is a 70 y.o. year old male who developed fever, chills, and malaise following right ureteroscopy with laser lithotripsy and stent placement on 02/20/2021 with Dr. Diamantina Providence for management of a 5 mm distal right ureteral stone.  Notably, he had removed his stent at home without taking prophylactic Bactrim prior as instructed.  He was recommended to proceed to the emergency department after being evaluated in clinic with concern for UTI/early sepsis from urinary source.  Admission labs were notable for >50 RBCs/hpf, 11-20 WBCs/hpf, and rare bacteria; WBC count 20.9; and creatinine 1.34.  Today, creatinine is stable at 1.41 and leukocytosis is improving at 17.8.  Blood cultures pending with no growth  <24 hours. Urine culture pending, on antibiotics as below.  He was febrile overnight to 38C but is now afebrile, VSS.  Patient is voiding spontaneously and reports some urinary urgency and urge incontinence.  Otherwise, he states he is feeling significantly better since admission.  Anti-infectives (From admission, onward)   Start     Dose/Rate Route Frequency Ordered Stop   02/26/21 1000  cefTRIAXone (ROCEPHIN) 2 g in sodium chloride 0.9 % 100 mL IVPB        2 g 200 mL/hr over 30 Minutes Intravenous Every 24 hours 02/25/21 1850     02/25/21 1530  cefTRIAXone (ROCEPHIN) 1 g in sodium chloride 0.9 % 100 mL IVPB        1 g 200 mL/hr over 30 Minutes Intravenous  Once 02/25/21 1522 02/25/21 1643       Past Medical History:  Diagnosis Date  . Arthritis   . ASCVD (arteriosclerotic cardiovascular disease)   . Cervical radiculopathy   . Chest pain, unspecified   . Coronary artery disease   . Depression   . Dysplastic nevus    left ear - treated by Dr. Sharlett Iles  . GAD (generalized anxiety disorder)   . GAD  (generalized anxiety disorder)   . GERD (gastroesophageal reflux disease)   . Gout   . HLD (hyperlipidemia)   . Hypertension   . Kidney stone   . Obesity   . Pre-diabetes    borderline. patient unaware of this,  . Reflux   . Sleep apnea    CAN NOT USE HIS CPAP  . STEMI (ST elevation myocardial infarction) ([place]) 2016   2 STENTS  . Vertigo    none recently    Past Surgical History:  Procedure Laterality Date  . ANTERIOR CRUCIATE LIGAMENT REPAIR Right 1993, 2005   1993  . APPENDECTOMY    . CARDIAC CATHETERIZATION  2016   stent  . CHOLECYSTECTOMY    . COLONOSCOPY WITH PROPOFOL N/A 12/31/2019   Procedure: COLONOSCOPY WITH PROPOFOL;  Surgeon: [name], MD;  Location: [facility];  Service: Endoscopy;  Laterality: N/A;  . CORONARY STENT PLACEMENT    . CYSTOSCOPY/URETEROSCOPY/HOLMIUM LASER/STENT PLACEMENT Right 02/20/2021   Procedure: CYSTOSCOPY/URETEROSCOPY/HOLMIUM LASER/STENT PLACEMENT;  Surgeon: Billey Co, MD;  Location: [facility];  Service: Urology;  Laterality: Right;  . EXTRACORPOREAL SHOCK WAVE LITHOTRIPSY Right 01/22/2021   Procedure: EXTRACORPOREAL SHOCK WAVE LITHOTRIPSY (ESWL);  Surgeon: Hollice Espy, MD;  Location: [facility];  Service: Urology;  Laterality: Right;  . EYE SURGERY Left   . SHOULDER ARTHROSCOPY WITH ROTATOR CUFF REPAIR AND SUBACROMIAL DECOMPRESSION Right 10/23/2020   Procedure: RIGHT SHOULDER  ARTHROSCOPY WITH DEB RIDEMENT;  Surgeon: Thornton Park, MD;  Location: [facility];  Service: Orthopedics;  Laterality: Right;  . TONSILLECTOMY    . UPPER GI ENDOSCOPY      Home Medications:  Current Meds  Medication Sig  . aspirin EC 81 MG tablet Take 81 mg by mouth in the morning. Swallow whole.  Marland Kitchen atorvastatin (LIPITOR) 80 MG tablet Take 80 mg by mouth in the morning.  Marland Kitchen ketorolac (TORADOL) 10 MG tablet Take 1 tablet (10 mg total) by mouth every 6 (six) hours as needed.  Marland Kitchen losartan (COZAAR) 50 MG tablet Take 1 tablet (50 mg total) by mouth daily.  .  pantoprazole (PROTONIX) 40 MG tablet TAKE 1 TABLET(40 MG) BY MOUTH TWICE DAILY (Patient taking differently: Take 40 mg by mouth 2 (two) times daily.)  . PARoxetine (PAXIL) 40 MG tablet TAKE 1 TABLET(40 MG) BY MOUTH DAILY (Patient taking differently: Take 40 mg by mouth daily.)  . tamsulosin (FLOMAX) 0.4 MG CAPS capsule Take 1 capsule (0.4 mg total) by mouth daily.    Allergies:  Allergies  Allergen Reactions  . Hydrocodone Other (See Comments)    Cold sweats   . Codeine Other (See Comments)    Cold sweats (30 years ago) but has taken since with no reaction    Family History  Problem Relation Age of Onset  . Cancer Mother        lung  . Stroke Mother   . Multiple sclerosis Mother   . Heart attack Father   . Bladder Cancer Maternal Grandfather   . Heart disease Maternal Grandfather   . Prostate cancer Neg Hx   . Kidney disease Neg Hx     Social History:  reports that he quit smoking about 18 years ago. His smoking use included cigarettes. He has a 22.50 pack-year smoking history. His smokeless tobacco use includes chew. He reports current alcohol use of about 7.0 - 14.0 standard drinks of alcohol per week. He reports that he does not use drugs.  ROS: A complete review of systems was performed.  All systems are negative except for pertinent findings as noted.  Physical Exam:  Vital signs in last 24 hours: Temp:  [97.8 F (36.6 C)-101.5 F (38.6 C)] 97.8 F (36.6 C) (05/12 0823) Pulse Rate:  [77-116] 77 (05/12 0823) Resp:  [16-18] 16 (05/12 0823) BP: (100-137)/(57-90) 132/67 (05/12 0823) SpO2:  [96 %-100 %] 99 % (05/12 0823) Weight:  [88.5 kg-90.7 kg] 90.7 kg (05/11 1504) Constitutional:  Alert and oriented, no acute distress HEENT: [place] AT, moist mucus membranes Cardiovascular: No clubbing, cyanosis, or edema Respiratory: Normal respiratory effort Skin: No rashes, bruises or suspicious lesions Neurologic: Grossly intact, no focal deficits, moving all 4  extremities Psychiatric: Normal mood and affect   Laboratory Data:  Recent Labs    02/25/21 1500 02/26/21 0458  WBC 20.9* 17.8*  HGB 14.8 12.9*  HCT 40.6 36.3*   Recent Labs    02/25/21 1500 02/26/21 0458  NA 130* 130*  K 3.4* 3.2*  CL 100 100  CO2 18* 19*  GLUCOSE 157* 138*  BUN 20 22  CREATININE 1.34* 1.41*  CALCIUM 8.6* 8.0*   Urinalysis    Component Value Date/Time   COLORURINE YELLOW (A) 02/25/2021 1500   APPEARANCEUR HAZY (A) 02/25/2021 1500   LABSPEC 1.023 02/25/2021 1500   PHURINE 5.0 02/25/2021 1500   GLUCOSEU NEGATIVE 02/25/2021 1500   HGBUR LARGE (A) 02/25/2021 1500   BILIRUBINUR NEGATIVE 02/25/2021 1500   BILIRUBINUR  negative 01/29/2020 1004   KETONESUR NEGATIVE 02/25/2021 1500   PROTEINUR 100 (A) 02/25/2021 1500   UROBILINOGEN 0.2 01/29/2020 1004   NITRITE NEGATIVE 02/25/2021 1500   LEUKOCYTESUR NEGATIVE 02/25/2021 1500   Results for orders placed or performed during the hospital encounter of 02/25/21  Blood culture (routine x 2)     Status: None (Preliminary result)   Collection Time: 02/25/21  3:30 PM   Specimen: BLOOD  Result Value Ref Range Status   Specimen Description BLOOD BLOOD LEFT FOREARM  Final   Special Requests   Final    BOTTLES DRAWN AEROBIC AND ANAEROBIC Blood Culture adequate volume   Culture   Final    NO GROWTH < 24 HOURS Performed at [facility], [address] [place], [place] [address]    Report Status PENDING  Incomplete  Blood culture (routine x 2)     Status: None (Preliminary result)   Collection Time: 02/25/21  3:45 PM   Specimen: BLOOD  Result Value Ref Range Status   Specimen Description BLOOD RIGHT ANTECUBITAL  Final   Special Requests   Final    BOTTLES DRAWN AEROBIC AND ANAEROBIC Blood Culture adequate volume   Culture   Final    NO GROWTH < 24 HOURS Performed at [facility], [address], [place], [place] [address]    Report Status PENDING  Incomplete  Resp Panel by RT-PCR (Flu  A&B, Covid) Nasopharyngeal Swab     Status: None   Collection Time: 02/25/21  5:16 PM   Specimen: Nasopharyngeal Swab; Nasopharyngeal(NP) swabs in vial transport medium  Result Value Ref Range Status   SARS Coronavirus 2 by RT PCR NEGATIVE NEGATIVE Final    Comment: (NOTE) SARS-CoV-2 target nucleic acids are NOT DETECTED.  The SARS-CoV-2 RNA is generally detectable in upper respiratory specimens during the acute phase of infection. The lowest concentration of SARS-CoV-2 viral copies this assay can detect is 138 copies/mL. A negative result does not preclude SARS-Cov-2 infection and should not be used as the sole basis for treatment or other patient management decisions. A negative result may occur with  improper specimen collection/handling, submission of specimen other than nasopharyngeal swab, presence of viral mutation(s) within the areas targeted by this assay, and inadequate number of viral copies(<138 copies/mL). A negative result must be combined with clinical observations, patient history, and epidemiological information. The expected result is Negative.  Fact Sheet for Patients:  EntrepreneurPulse.com.au  Fact Sheet for Healthcare Providers:  IncredibleEmployment.be  This test is no t yet approved or cleared by the [place] FDA and  has been authorized for detection and/or diagnosis of SARS-CoV-2 by FDA under an Emergency Use Authorization (EUA). This EUA will remain  in effect (meaning this test can be used) for the duration of the COVID-19 declaration under Section 564(b)(1) of the Act, 21 U.S.C.section 360bbb-3(b)(1), unless the authorization is terminated  or revoked sooner.       Influenza A by PCR NEGATIVE NEGATIVE Final   Influenza B by PCR NEGATIVE NEGATIVE Final    Comment: (NOTE) The Xpert Xpress SARS-CoV-2/FLU/RSV plus assay is intended as an aid in the diagnosis of influenza from Nasopharyngeal swab specimens  and should not be used as a sole basis for treatment. Nasal washings and aspirates are unacceptable for Xpert Xpress SARS-CoV-2/FLU/RSV testing.  Fact Sheet for Patients: EntrepreneurPulse.com.au  Fact Sheet for Healthcare Providers: IncredibleEmployment.be  This test is not yet approved or cleared by the [place] and has been authorized for  detection and/or diagnosis of SARS-CoV-2 by FDA under an Emergency Use Authorization (EUA). This EUA will remain in effect (meaning this test can be used) for the duration of the COVID-19 declaration under Section 564(b)(1) of the Act, 21 U.S.C. section 360bbb-3(b)(1), unless the authorization is terminated or revoked.  Performed at [facility], [address] [place], [place] [address]    Assessment & Plan:  70 year old male with febrile UTI following right URS/LL/stent placement for management of a 5 mm distal right ureteral stone.  Patient clinically improving on empiric antibiotics.  Recommendations: -Continue empiric antibiotics and follow cultures.  He will require a total of 10 to 14 days of culture appropriate therapy -Defer further imaging unless he clinically decompensates or fails to improve  Thank you for involving me in this patient's care, please page with any further questions or concerns.  Debroah Loop, PA-C 02/26/2021 9:35 AM

## 2021-02-26 NOTE — Progress Notes (Signed)
Inpatient Diabetes Program Recommendations  AACE/ADA: New Consensus Statement on Inpatient Glycemic Control (2015)  Target Ranges:  Prepandial:   less than 140 mg/dL      Peak postprandial:   less than 180 mg/dL (1-2 hours)      Critically ill patients:  140 - 180 mg/dL   Lab Results  Component Value Date   GLUCAP 128 (H) 02/26/2021   HGBA1C 7.4 (H) 02/25/2021    Review of Glycemic Control Results for LUGENE, BEOUGHER (MRN [id number]) as of 02/26/2021 13:17  Ref. Range 02/25/2021 20:32 02/26/2021 08:22 02/26/2021 12:20  Glucose-Capillary Latest Ref Range: 70 - 99 mg/dL 143 (H) 131 (H) 128 (H)   Diabetes history: DM 2 Outpatient Diabetes medications:  Metformin 500 mg bid- has not started taking yet  Current orders for Inpatient glycemic control:  Novolog sensitive tid with meals and HS  Inpatient Diabetes Program Recommendations:    Spoke with patient regarding new diagnosis of DM. He has not started taking metformin yet.  He does have a meter at home.  Discussed normal blood sugar values of 80-130 mg/dL.  He states that he has been very thirsty recently.  I explained that this can be a symptom of high blood sugar however blood sugars have not been that elevated in the hospital.  Discussed importance of removing sugar from beverages and limiting CHO consumption.  He has attended DM classes in the past.  Patient has LWWD booklet at bedside.  Explained importance of controlling CBG's- patient verbalized understanding.  No further questions at this time.  Needs close f/u with PCP.   Thanks  Adah Perl, RN, BC-ADM Inpatient Diabetes Coordinator Pager [phone] (8a-5p)

## 2021-02-26 NOTE — Progress Notes (Signed)
PROGRESS NOTE    HAZEM KENNER  DJS:[id number] DOB: [birth year] DOA: 02/25/2021 PCP: Jerrol Banana., MD  140A/140A-AA   Assessment & Plan:   Active Problems:   Sepsis ([place])   Michael Leon is a 70 y.o. male with past medical history of arthritis, anxiety, chest pain, depression, gastric reflux, myocardial infarction with stent and hyperlipidemia presented to hospital with fever nausea and diarrhea.  Patient had kidney stone which was treated with lithotripsy a month back but subsequently needed a stent which was removed 2 days back.  Since then having intermittent lower abdominal pain urinary urgency frequency.  He did have a fever up to 102 F yesterday.  He also complained of frequent episodes of diarrhea a day before stent was removed.    Sepsis  Presumed 2/2 urinary source SIRS met with significant leukocytosis fever tachycardia.   --UTI presumed the source since pt has had recent urological procedures, however, UA benign on presentation. --May also be GI source, since pt had diarrhea --started on ceftriaxone for empiric tx of UTI Plan: --cont ceftriaxone pending urine cx results --obtain C diff and GI path --follow blood cx  Diarrhea, POA --no abdominal pain. --obtain C diff and GI path  Recent Ureteroscopy and Laser Lithotripsy for management of 5 mm right ureteral stone --urology consulted --rec abx tx  Diabetes mellitus type 2.   --A1c 7.4 --hold home metformin --d/c fingersticks and SSI  Mild elevated creatinine.   --likely due to dehydration from diarrhea --cont MIVF_0  for now --Hold losartan for now.     DVT prophylaxis: Lovenox SQ Code Status: Full code  Family Communication:  Level of care: Med-Surg Dispo:   The patient is from: home Anticipated d/c is to: home Anticipated d/c date is: 1-2 days Patient currently is not medically ready to d/c due to: pending urine cx   Subjective and Interval History:  Pt complained of being very  thirsty, despite drinking tons of fluids.  Still having diarrhea but improved.  No dysuria.  No abdominal pain.   Objective: Vitals:   02/26/21 0823 02/26/21 1142 02/26/21 1518 02/26/21 1519  BP: 132/67 (!) 142/73 (!) 138/93   Pulse: 77 93 92   Resp: _1 Temp: 97.8 F (36.6 C) 98.1 F (36.7 C) 99.8 F (37.7 C) 98.6 F (37 C)  TempSrc:  Oral Oral   SpO2: 99% 98% 100%   Weight:      Height:        Intake/Output Summary (Last 24 hours) at 02/26/2021 1644 Last data filed at 02/26/2021 1500 Gross per 24 hour  Intake 1926.8 ml  Output --  Net 1926.8 ml   Filed Weights   02/25/21 1504  Weight: 90.7 kg    Examination:   Constitutional: NAD, AAOx3 HEENT: conjunctivae and lids normal, EOMI CV: No cyanosis.   RESP: normal respiratory effort, on RA Extremities: No effusions, edema in BLE SKIN: warm, dry Neuro: II - XII grossly intact.   Psych: Normal mood and affect.  Appropriate judgement and reason   Data Reviewed: I have personally reviewed following labs and imaging studies  CBC: Recent Labs  Lab 02/25/21 1500 02/26/21 0458  WBC 20.9* 17.8*  NEUTROABS 17.3*  --   HGB 14.8 12.9*  HCT 40.6 36.3*  MCV 89.4 90.1  PLT 190 885   Basic Metabolic Panel: Recent Labs  Lab 02/25/21 1500 02/26/21 0458  NA 130* 130*  K 3.4* 3.2*  CL 100 100  CO2 18*  19*  GLUCOSE 157* 138*  BUN 20 22  CREATININE 1.34* 1.41*  CALCIUM 8.6* 8.0*  MG  --  1.7  PHOS  --  2.9   GFR: Estimated Creatinine Clearance: 51.9 mL/min (A) (by C-G formula based on SCr of 1.41 mg/dL (H)). Liver Function Tests: Recent Labs  Lab 02/25/21 1500 02/26/21 0458  AST 17 17  ALT 15 12  ALKPHOS 64 60  BILITOT 1.4* 0.9  PROT 6.9 6.2*  ALBUMIN 3.4* 3.0*   No results for input(s): LIPASE, AMYLASE in the last 168 hours. No results for input(s): AMMONIA in the last 168 hours. Coagulation Profile: No results for input(s): INR, PROTIME in the last 168 hours. Cardiac Enzymes: No results for  input(s): CKTOTAL, CKMB, CKMBINDEX, TROPONINI in the last 168 hours. BNP (last 3 results) No results for input(s): PROBNP in the last 8760 hours. HbA1C: Recent Labs    02/25/21 1506  HGBA1C 7.4*   CBG: Recent Labs  Lab 02/25/21 2032 02/26/21 0822 02/26/21 1220 02/26/21 1616  GLUCAP 143* 131* 128* 119*   Lipid Profile: No results for input(s): CHOL, HDL, LDLCALC, TRIG, CHOLHDL, LDLDIRECT in the last 72 hours. Thyroid Function Tests: No results for input(s): TSH, T4TOTAL, FREET4, T3FREE, THYROIDAB in the last 72 hours. Anemia Panel: No results for input(s): VITAMINB12, FOLATE, FERRITIN, TIBC, IRON, RETICCTPCT in the last 72 hours. Sepsis Labs: Recent Labs  Lab 02/25/21 1500 02/25/21 1906  LATICACIDVEN 1.6 1.6    Recent Results (from the past 240 hour(s))  Blood culture (routine x 2)     Status: None (Preliminary result)   Collection Time: 02/25/21  3:30 PM   Specimen: BLOOD  Result Value Ref Range Status   Specimen Description BLOOD BLOOD LEFT FOREARM  Final   Special Requests   Final    BOTTLES DRAWN AEROBIC AND ANAEROBIC Blood Culture adequate volume   Culture   Final    NO GROWTH < 24 HOURS Performed at [facility], [address] [place], [place] [address]    Report Status PENDING  Incomplete  Blood culture (routine x 2)     Status: None (Preliminary result)   Collection Time: 02/25/21  3:45 PM   Specimen: BLOOD  Result Value Ref Range Status   Specimen Description BLOOD RIGHT ANTECUBITAL  Final   Special Requests   Final    BOTTLES DRAWN AEROBIC AND ANAEROBIC Blood Culture adequate volume   Culture   Final    NO GROWTH < 24 HOURS Performed at [facility], [address] [place], [place] [address]    Report Status PENDING  Incomplete  Microscopic Examination     Status: Abnormal   Collection Time: 02/25/21  4:11 PM   Urine  Result Value Ref Range Status   WBC, UA 11-30 (A) 0 - 5 /hpf Final   RBC 11-30 (A) 0 - 2 /hpf Final    Epithelial Cells (non renal) 0-10 0 - 10 /hpf Final   Bacteria, UA None seen None seen/Few Final  Resp Panel by RT-PCR (Flu A&B, Covid) Nasopharyngeal Swab     Status: None   Collection Time: 02/25/21  5:16 PM   Specimen: Nasopharyngeal Swab; Nasopharyngeal(NP) swabs in vial transport medium  Result Value Ref Range Status   SARS Coronavirus 2 by RT PCR NEGATIVE NEGATIVE Final    Comment: (NOTE) SARS-CoV-2 target nucleic acids are NOT DETECTED.  The SARS-CoV-2 RNA is generally detectable in upper respiratory specimens during the acute phase of infection. The lowest concentration  of SARS-CoV-2 viral copies this assay can detect is 138 copies/mL. A negative result does not preclude SARS-Cov-2 infection and should not be used as the sole basis for treatment or other patient management decisions. A negative result may occur with  improper specimen collection/handling, submission of specimen other than nasopharyngeal swab, presence of viral mutation(s) within the areas targeted by this assay, and inadequate number of viral copies(<138 copies/mL). A negative result must be combined with clinical observations, patient history, and epidemiological information. The expected result is Negative.  Fact Sheet for Patients:  EntrepreneurPulse.com.au  Fact Sheet for Healthcare Providers:  IncredibleEmployment.be  This test is no t yet approved or cleared by the [place] FDA and  has been authorized for detection and/or diagnosis of SARS-CoV-2 by FDA under an Emergency Use Authorization (EUA). This EUA will remain  in effect (meaning this test can be used) for the duration of the COVID-19 declaration under Section 564(b)(1) of the Act, 21 U.S.C.section 360bbb-3(b)(1), unless the authorization is terminated  or revoked sooner.       Influenza A by PCR NEGATIVE NEGATIVE Final   Influenza B by PCR NEGATIVE NEGATIVE Final    Comment: (NOTE) The Xpert  Xpress SARS-CoV-2/FLU/RSV plus assay is intended as an aid in the diagnosis of influenza from Nasopharyngeal swab specimens and should not be used as a sole basis for treatment. Nasal washings and aspirates are unacceptable for Xpert Xpress SARS-CoV-2/FLU/RSV testing.  Fact Sheet for Patients: EntrepreneurPulse.com.au  Fact Sheet for Healthcare Providers: IncredibleEmployment.be  This test is not yet approved or cleared by the [place] FDA and has been authorized for detection and/or diagnosis of SARS-CoV-2 by FDA under an Emergency Use Authorization (EUA). This EUA will remain in effect (meaning this test can be used) for the duration of the COVID-19 declaration under Section 564(b)(1) of the Act, 21 U.S.C. section 360bbb-3(b)(1), unless the authorization is terminated or revoked.  Performed at [facility], [place], [place] [address]   C Difficile Quick Screen w PCR reflex     Status: None   Collection Time: 02/26/21  1:50 PM   Specimen: STOOL  Result Value Ref Range Status   C Diff antigen NEGATIVE NEGATIVE Final   C Diff toxin NEGATIVE NEGATIVE Final   C Diff interpretation No C. difficile detected.  Final    Comment: Performed at [facility], [address] [place],  [address]  Gastrointestinal Panel by PCR , Stool     Status: None   Collection Time: 02/26/21  1:50 PM   Specimen: Stool  Result Value Ref Range Status   Campylobacter species NOT DETECTED NOT DETECTED Final   Plesimonas shigelloides NOT DETECTED NOT DETECTED Final   Salmonella species NOT DETECTED NOT DETECTED Final   Yersinia enterocolitica NOT DETECTED NOT DETECTED Final   Vibrio species NOT DETECTED NOT DETECTED Final   Vibrio cholerae NOT DETECTED NOT DETECTED Final   Enteroaggregative E coli (EAEC) NOT DETECTED NOT DETECTED Final   Enteropathogenic E coli (EPEC) NOT DETECTED NOT DETECTED Final   Enterotoxigenic E  coli (ETEC) NOT DETECTED NOT DETECTED Final   Shiga like toxin producing E coli (STEC) NOT DETECTED NOT DETECTED Final   Shigella/Enteroinvasive E coli (EIEC) NOT DETECTED NOT DETECTED Final   Cryptosporidium NOT DETECTED NOT DETECTED Final   Cyclospora cayetanensis NOT DETECTED NOT DETECTED Final   Entamoeba histolytica NOT DETECTED NOT DETECTED Final   Giardia lamblia NOT DETECTED NOT DETECTED Final   Adenovirus F40/41 NOT  DETECTED NOT DETECTED Final   Astrovirus NOT DETECTED NOT DETECTED Final   Norovirus GI/GII NOT DETECTED NOT DETECTED Final   Rotavirus A NOT DETECTED NOT DETECTED Final   Sapovirus (I, II, IV, and V) NOT DETECTED NOT DETECTED Final    Comment: Performed at [facility], [address] [place], [place] [address]      Radiology Studies: No results found.   Scheduled Meds: . aspirin EC  81 mg Oral q morning  . atorvastatin  80 mg Oral q morning  . enoxaparin (LOVENOX) injection  40 mg Subcutaneous Q24H  . insulin aspart  0-9 Units Subcutaneous TID WC  . pantoprazole  40 mg Oral Daily  . PARoxetine  40 mg Oral Daily  . tamsulosin  0.4 mg Oral Daily   Continuous Infusions: . sodium chloride 100 mL/hr at 02/26/21 1225  . cefTRIAXone (ROCEPHIN)  IV 2 g (02/26/21 0901)     LOS: 1 day     Enzo Bi, MD [facility] If 7PM-7AM, please contact night-coverage 02/26/2021, 4:44 PM

## 2021-02-27 DIAGNOSIS — A419 Sepsis, unspecified organism: Secondary | ICD-10-CM | POA: Diagnosis not present

## 2021-02-27 LAB — CBC
HCT: 34.4 % — ABNORMAL LOW (ref 39.0–52.0)
Hemoglobin: 12 g/dL — ABNORMAL LOW (ref 13.0–17.0)
MCH: 31.4 pg (ref 26.0–34.0)
MCHC: 34.9 g/dL (ref 30.0–36.0)
MCV: 90.1 fL (ref 80.0–100.0)
Platelets: 176 10*3/uL (ref 150–400)
RBC: 3.82 MIL/uL — ABNORMAL LOW (ref 4.22–5.81)
RDW: 13.4 % (ref 11.5–15.5)
WBC: 10.8 10*3/uL — ABNORMAL HIGH (ref 4.0–10.5)
nRBC: 0 % (ref 0.0–0.2)

## 2021-02-27 LAB — BASIC METABOLIC PANEL
Anion gap: 6 (ref 5–15)
BUN: 19 mg/dL (ref 8–23)
CO2: 20 mmol/L — ABNORMAL LOW (ref 22–32)
Calcium: 7.9 mg/dL — ABNORMAL LOW (ref 8.9–10.3)
Chloride: 108 mmol/L (ref 98–111)
Creatinine, Ser: 1.12 mg/dL (ref 0.61–1.24)
GFR, Estimated: >60 mL/min (ref >60)
Glucose, Bld: 115 mg/dL — ABNORMAL HIGH (ref 70–99)
Potassium: 3.2 mmol/L — ABNORMAL LOW (ref 3.5–5.1)
Sodium: 134 mmol/L — ABNORMAL LOW (ref 135–145)

## 2021-02-27 LAB — MAGNESIUM: Magnesium: 2 mg/dL (ref 1.7–2.4)

## 2021-02-27 LAB — URINE CULTURE: Culture: <10000 — AB

## 2021-02-27 LAB — PROCALCITONIN: Procalcitonin: 4.89 ng/mL

## 2021-02-27 LAB — HIV ANTIBODY (ROUTINE TESTING W REFLEX): HIV Screen 4th Generation wRfx: NONREACTIVE

## 2021-02-27 MED ORDER — SULFAMETHOXAZOLE-TRIMETHOPRIM 800-160 MG PO TABS: 1.0000 | ORAL_TABLET | Freq: Two times a day (BID) | ORAL | 0 refills | Status: AC

## 2021-02-27 MED ORDER — POTASSIUM CHLORIDE CRYS ER 20 MEQ PO TBCR
40.0000 meq | EXTENDED_RELEASE_TABLET | Freq: Once | ORAL | Status: AC
  Administered 2021-02-27: 40 meq via ORAL
  Filled 2021-02-27: qty 2

## 2021-02-27 MED ORDER — SULFAMETHOXAZOLE-TRIMETHOPRIM 800-160 MG PO TABS
1.0000 | ORAL_TABLET | Freq: Two times a day (BID) | ORAL | Status: DC
  Administered 2021-02-27: 1 via ORAL
  Filled 2021-02-27: qty 1

## 2021-02-27 NOTE — Plan of Care (Signed)
  Problem: Food- and Nutrition-Related Knowledge Deficit (NB-1.1) Goal: Nutrition education Description: Formal process to instruct or train a patient/client in a skill or to impart knowledge to help patients/clients voluntarily manage or modify food choices and eating behavior to maintain or improve health. Outcome: Progressing Note: RD consulted for nutrition education regarding diabetes.  Pt resting in bed at the time of visit. States he was dx with DM about 2 weeks ago. Pt does not believe that he is prescribed any medications to control his DM. DM educator has met with pt and pt reports that he did go to a class for newly dx DM. However, unsure how effective this method was as pt reports it was extremely "boring."   Lab Results  Component Value Date   HGBA1C 7.4 (H) 02/25/2021   RD provided "Carbohydrate Counting for People with Diabetes" handout from the Academy of [employer] and Dietetics. Discussed different food groups and their effects on blood sugar, emphasizing carbohydrate-containing foods. Provided list of carbohydrates and recommended serving sizes of common foods.  Discussed importance of controlled and consistent carbohydrate intake throughout the day. Encouraged pt to aim for 60g of carbs per meal and 15-30 per snacks. Provided examples of ways to balance meals/snacks and encouraged intake of high-fiber, whole grain complex carbohydrates. Teach back method used.  Expect minimal compliance.  Body mass index is 33.28 kg/m. Pt meets criteria for obesity class I based on current BMI.  Current diet order is carb modified, patient has no meals at this time - does however report he did not like the dinner he received, food was not flavored to his liking. Labs and medications reviewed. No further nutrition interventions warranted at this time. If additional nutrition issues arise, please re-consult RD.  Ranell Patrick, RD, LDN Clinical Dietitian Pager on [place]

## 2021-02-27 NOTE — Progress Notes (Signed)
Blood pressure 129/68, pulse 83, temperature 100 F (37.8 C), temperature source Oral, resp. rate 18, height 5\' 5"  (1.651 m), weight 90.7 kg, SpO97  VSS, IV catheter removed site was clean, dry and intact. Went over discharge packet with pt which he verbalized understanding. Pt transported via W/C to personal car with all belongings and discharge packet.

## 2021-02-27 NOTE — Discharge Summary (Signed)
Physician Discharge Summary   Michael Leon  male DOB: [birth year]  BSW:[id number]  PCP: Jerrol Banana., MD  Admit date: 02/25/2021 Discharge date: 02/27/2021  Admitted From: home Disposition:  home CODE STATUS: Full code  Discharge Instructions    Discharge instructions   Complete by: As directed    Like we discussed, we don't have a definite source for your infectious looking presentation.  You have received 3 days of IV antibiotic, and is discharged on 7 more days of Bactrim to take at home as directed, to treat presumed infection from urinary source.  Checked with your urologist, and they are NOT concerned about yeast infection in your urine.   Dr. Enzo Bi [facility] Course:  For full details, please see H&P, progress notes, consult notes and ancillary notes.  Briefly,  Reynald Woods Brutonis a 70 y.o.malewith past medical history of arthritis, anxiety, chest pain, depression, gastric reflux, myocardial infarction with stent and hyperlipidemia presented to hospital with fever nausea and diarrhea. Patient had kidney stone which was treated with lithotripsy a month back but subsequently needed a stent which was removed 2 days back. Since then having intermittent lower abdominal pain urinary urgency frequency. He did have a fever up to 102 F yesterday. He also complained of frequent episodes of diarrheaa day before stent was removed.    Sepsis  Presumed 2/2 urinary source SIRS met with significant leukocytosis fever tachycardia.   --UTI presumed the source since pt has had recent urological procedures, however, UA benign on presentation.  Just right before discharge, pt remembered to tell me that he was given an IM shot of abx in urology clinic prior to presentation, which explained why UA was non-infectious-looking, and urine cx had no significant growth.   --pt was started on ceftriaxone for empiric tx of UTI, and received 3 days before discharging  home with 7 more days of Bactrim.  Urology agreed with the abx plan.  Prior to discharge, pt was feeling better and leukocytosis had improved.   --Pt also had diarrhea prior to presentation, however, C diff and GI path both neg, and diarrhea improved after presentation.  Diarrhea, POA --no abdominal pain. --Pt also had diarrhea prior to presentation, however, C diff and GI path both neg, and diarrhea improved after presentation.  Recent Ureteroscopy and Laser Lithotripsy for management of 5 mm right ureteral stone --urology consulted, agreed with the above mentioned management.  Diabetes mellitus type 2. --A1c 7.4 --home metformin held while inpatient, resumed at discharge.  Mild elevated creatinine. --Cr 1.34 on presentation, improved to 1.12 after MIVF.  Cr bump likely due to dehydration from diarrhea.  Home Losartan held during hospitalization and resume at discharge.   Discharge Diagnoses:  Active Problems:   Sepsis ([place])   30 Day Unplanned Readmission Risk Score   Flowsheet Row ED to Hosp-Admission (Current) from 02/25/2021 in [place] (1A)  30 Day Unplanned Readmission Risk Score (%) 9.95 Filed at 02/27/2021 1200     This score is the patient's risk of an unplanned readmission within 30 days of being discharged (0 -100%). The score is based on dignosis, age, lab data, medications, orders, and past utilization.   Low:  0-14.9   Medium: 15-21.9   High: 22-29.9   Extreme: 30 and above        Discharge Instructions:  Allergies as of 02/27/2021      Reactions   Hydrocodone Other (See  Comments)   Cold sweats    Codeine Other (See Comments)   Cold sweats (30 years ago) but has taken since with no reaction      Medication List    STOP taking these medications   ketorolac 10 MG tablet Commonly known as: TORADOL     TAKE these medications   aspirin EC 81 MG tablet Take 81 mg by mouth in the morning. Swallow whole.    atorvastatin 80 MG tablet Commonly known as: LIPITOR Take 80 mg by mouth in the morning.   losartan 50 MG tablet Commonly known as: COZAAR Take 1 tablet (50 mg total) by mouth daily.   metFORMIN 500 MG tablet Commonly known as: GLUCOPHAGE Take 1 tablet (500 mg total) by mouth 2 (two) times daily with a meal.   pantoprazole 40 MG tablet Commonly known as: PROTONIX TAKE 1 TABLET(40 MG) BY MOUTH TWICE DAILY What changed: See the new instructions.   PARoxetine 40 MG tablet Commonly known as: PAXIL TAKE 1 TABLET(40 MG) BY MOUTH DAILY What changed: See the new instructions.   sulfamethoxazole-trimethoprim 800-160 MG tablet Commonly known as: BACTRIM DS Take 1 tablet by mouth 2 (two) times daily for 7 days. Antibiotic. Start taking on: Feb 28, 2021   tamsulosin 0.4 MG Caps capsule Commonly known as: FLOMAX Take 1 capsule (0.4 mg total) by mouth daily.        Follow-up Information    Jerrol Banana., MD. Schedule an appointment as soon as possible for a visit in 1 week(s).   Specialty: Family Medicine Contact information: [address] [place] [address] [phone]               Allergies  Allergen Reactions  . Hydrocodone Other (See Comments)    Cold sweats   . Codeine Other (See Comments)    Cold sweats (30 years ago) but has taken since with no reaction     The results of significant diagnostics from this hospitalization (including imaging, microbiology, ancillary and laboratory) are listed below for reference.   Consultations:   Procedures/Studies: DG Abd 1 View  Result Date: 02/18/2021 CLINICAL DATA:  Nephrolithiasis EXAM: ABDOMEN - 1 VIEW COMPARISON:  February 03, 2021 FINDINGS: Previous apparent calculus on the right at the level of L2 is no longer appreciable. No renal or ureteral calculus is appreciable on this study. Calcifications immediately to the right of L1 may represent foci of pancreatic calcification, also present on  previous study. There is moderate stool in the colon. There is no bowel dilatation or air-fluid level to suggest bowel obstruction. No free air. IMPRESSION: Previous apparent calculus on the right at the L2 level no longer evident. Calcifications more superiorly on the right likely represent foci of chronic pancreatitis. No bowel obstruction or free air evident. Electronically Signed   By: Lowella Grip III M.D.   On: 02/18/2021 14:08   DG Abd 1 View  Result Date: 02/03/2021 CLINICAL DATA:  Nephrolithiasis. EXAM: ABDOMEN - 1 VIEW COMPARISON:  01/22/2021 radiograph and 01/09/2021 CT. FINDINGS: A 4 mm calcification is noted in the region of the RIGHT UPJ. No other definite calcifications are noted overlying the renal shadows or expected course of the ureters. The bowel gas pattern is unremarkable. No acute bony abnormalities are present. IMPRESSION: Question 4 mm RIGHT UPJ calculus. Electronically Signed   By: Margarette Canada M.D.   On: 02/03/2021 15:27   DG OR UROLOGY CYSTO IMAGE ([place])  Result Date: 02/20/2021 There  is no interpretation for this exam.  This order is for images obtained during a surgical procedure.  Please See "Surgeries" Tab for more information regarding the procedure.      Labs: BNP (last 3 results) No results for input(s): BNP in the last 8760 hours. Basic Metabolic Panel: Recent Labs  Lab 02/25/21 1500 02/26/21 0458 02/27/21 0521  NA 130* 130* 134*  K 3.4* 3.2* 3.2*  CL 100 100 108  CO2 18* 19* 20*  GLUCOSE 157* 138* 115*  BUN _0 CREATININE 1.34* 1.41* 1.12  CALCIUM 8.6* 8.0* 7.9*  MG  --  1.7 2.0  PHOS  --  2.9  --    Liver Function Tests: Recent Labs  Lab 02/25/21 1500 02/26/21 0458  AST 17 17  ALT 15 12  ALKPHOS 64 60  BILITOT 1.4* 0.9  PROT 6.9 6.2*  ALBUMIN 3.4* 3.0*   No results for input(s): LIPASE, AMYLASE in the last 168 hours. No results for input(s): AMMONIA in the last 168 hours. CBC: Recent Labs  Lab 02/25/21 1500  02/26/21 0458 02/27/21 0521  WBC 20.9* 17.8* 10.8*  NEUTROABS 17.3*  --   --   HGB 14.8 12.9* 12.0*  HCT 40.6 36.3* 34.4*  MCV 89.4 90.1 90.1  PLT 190 181 176   Cardiac Enzymes: No results for input(s): CKTOTAL, CKMB, CKMBINDEX, TROPONINI in the last 168 hours. BNP: Invalid input(s): POCBNP CBG: Recent Labs  Lab 02/25/21 2032 02/26/21 0822 02/26/21 1220 02/26/21 1616 02/26/21 2101  GLUCAP 143* 131* 128* 119* 144*   D-Dimer No results for input(s): DDIMER in the last 72 hours. Hgb A1c Recent Labs    02/25/21 1506  HGBA1C 7.4*   Lipid Profile No results for input(s): CHOL, HDL, LDLCALC, TRIG, CHOLHDL, LDLDIRECT in the last 72 hours. Thyroid function studies No results for input(s): TSH, T4TOTAL, T3FREE, THYROIDAB in the last 72 hours.  Invalid input(s): FREET3 Anemia work up No results for input(s): VITAMINB12, FOLATE, FERRITIN, TIBC, IRON, RETICCTPCT in the last 72 hours. Urinalysis    Component Value Date/Time   COLORURINE YELLOW (A) 02/25/2021 1500   APPEARANCEUR Hazy (A) 02/25/2021 1611   APPEARANCEUR HAZY (A) 02/25/2021 1500   LABSPEC 1.023 02/25/2021 1500   PHURINE 5.0 02/25/2021 1500   GLUCOSEU Trace (A) 02/25/2021 1611   HGBUR LARGE (A) 02/25/2021 1500   BILIRUBINUR Negative 02/25/2021 1611   BILIRUBINUR NEGATIVE 02/25/2021 1500   KETONESUR NEGATIVE 02/25/2021 1500   PROTEINUR 2+ (A) 02/25/2021 1611   PROTEINUR 100 (A) 02/25/2021 1500   UROBILINOGEN 0.2 01/29/2020 1004   NITRITE Negative 02/25/2021 1611   NITRITE NEGATIVE 02/25/2021 1500   LEUKOCYTESUR Negative 02/25/2021 1611   LEUKOCYTESUR NEGATIVE 02/25/2021 1500   Sepsis Labs Invalid input(s): PROCALCITONIN,  WBC,  LACTICIDVEN Microbiology Recent Results (from the past 240 hour(s))  Urine Culture     Status: Abnormal   Collection Time: 02/25/21  3:00 PM   Specimen: Urine, Random  Result Value Ref Range Status   Specimen Description   Final    URINE, RANDOM Performed at [facility], [address] [place], [place] [address]    Special Requests   Final    NONE Performed at [facility], [address], [place], [place] [address]    Culture (A)  Final    <10,000 COLONIES/mL INSIGNIFICANT GROWTH Performed at [facility], [place] [address] [place],  [address]    Report Status 02/27/2021 FINAL  Final  Blood culture (routine  x 2)     Status: None (Preliminary result)   Collection Time: 02/25/21  3:30 PM   Specimen: BLOOD  Result Value Ref Range Status   Specimen Description BLOOD BLOOD LEFT FOREARM  Final   Special Requests   Final    BOTTLES DRAWN AEROBIC AND ANAEROBIC Blood Culture adequate volume   Culture   Final    NO GROWTH 2 DAYS Performed at [facility], [address], [place], [place] [address]    Report Status PENDING  Incomplete  Blood culture (routine x 2)     Status: None (Preliminary result)   Collection Time: 02/25/21  3:45 PM   Specimen: BLOOD  Result Value Ref Range Status   Specimen Description BLOOD RIGHT ANTECUBITAL  Final   Special Requests   Final    BOTTLES DRAWN AEROBIC AND ANAEROBIC Blood Culture adequate volume   Culture   Final    NO GROWTH 2 DAYS Performed at [facility], [place] [place],  [address]    Report Status PENDING  Incomplete  Microscopic Examination     Status: Abnormal   Collection Time: 02/25/21  4:11 PM   Urine  Result Value Ref Range Status   WBC, UA 11-30 (A) 0 - 5 /hpf Final   RBC 11-30 (A) 0 - 2 /hpf Final   Epithelial Cells (non renal) 0-10 0 - 10 /hpf Final   Bacteria, UA None seen None seen/Few Final  Resp Panel by RT-PCR (Flu A&B, Covid) Nasopharyngeal Swab     Status: None   Collection Time: 02/25/21  5:16 PM   Specimen: Nasopharyngeal Swab; Nasopharyngeal(NP) swabs in vial transport medium  Result Value Ref Range Status   SARS Coronavirus 2 by RT PCR NEGATIVE NEGATIVE Final    Comment: (NOTE) SARS-CoV-2 target nucleic acids are NOT  DETECTED.  The SARS-CoV-2 RNA is generally detectable in upper respiratory specimens during the acute phase of infection. The lowest concentration of SARS-CoV-2 viral copies this assay can detect is 138 copies/mL. A negative result does not preclude SARS-Cov-2 infection and should not be used as the sole basis for treatment or other patient management decisions. A negative result may occur with  improper specimen collection/handling, submission of specimen other than nasopharyngeal swab, presence of viral mutation(s) within the areas targeted by this assay, and inadequate number of viral copies(<138 copies/mL). A negative result must be combined with clinical observations, patient history, and epidemiological information. The expected result is Negative.  Fact Sheet for Patients:  EntrepreneurPulse.com.au  Fact Sheet for Healthcare Providers:  IncredibleEmployment.be  This test is no t yet approved or cleared by the [place] FDA and  has been authorized for detection and/or diagnosis of SARS-CoV-2 by FDA under an Emergency Use Authorization (EUA). This EUA will remain  in effect (meaning this test can be used) for the duration of the COVID-19 declaration under Section 564(b)(1) of the Act, 21 U.S.C.section 360bbb-3(b)(1), unless the authorization is terminated  or revoked sooner.       Influenza A by PCR NEGATIVE NEGATIVE Final   Influenza B by PCR NEGATIVE NEGATIVE Final    Comment: (NOTE) The Xpert Xpress SARS-CoV-2/FLU/RSV plus assay is intended as an aid in the diagnosis of influenza from Nasopharyngeal swab specimens and should not be used as a sole basis for treatment. Nasal washings and aspirates are unacceptable for Xpert Xpress SARS-CoV-2/FLU/RSV testing.  Fact Sheet for Patients: EntrepreneurPulse.com.au  Fact Sheet for Healthcare Providers: IncredibleEmployment.be  This test is not yet  approved or cleared by the [place] and has been authorized for detection and/or diagnosis of SARS-CoV-2 by FDA under an Emergency Use Authorization (EUA). This EUA will remain in effect (meaning this test can be used) for the duration of the COVID-19 declaration under Section 564(b)(1) of the Act, 21 U.S.C. section 360bbb-3(b)(1), unless the authorization is terminated or revoked.  Performed at [facility], [place], [place] [address]   C Difficile Quick Screen w PCR reflex     Status: None   Collection Time: 02/26/21  1:50 PM   Specimen: STOOL  Result Value Ref Range Status   C Diff antigen NEGATIVE NEGATIVE Final   C Diff toxin NEGATIVE NEGATIVE Final   C Diff interpretation No C. difficile detected.  Final    Comment: Performed at [facility], [address] [place], [place] [address]  Gastrointestinal Panel by PCR , Stool     Status: None   Collection Time: 02/26/21  1:50 PM   Specimen: Stool  Result Value Ref Range Status   Campylobacter species NOT DETECTED NOT DETECTED Final   Plesimonas shigelloides NOT DETECTED NOT DETECTED Final   Salmonella species NOT DETECTED NOT DETECTED Final   Yersinia enterocolitica NOT DETECTED NOT DETECTED Final   Vibrio species NOT DETECTED NOT DETECTED Final   Vibrio cholerae NOT DETECTED NOT DETECTED Final   Enteroaggregative E coli (EAEC) NOT DETECTED NOT DETECTED Final   Enteropathogenic E coli (EPEC) NOT DETECTED NOT DETECTED Final   Enterotoxigenic E coli (ETEC) NOT DETECTED NOT DETECTED Final   Shiga like toxin producing E coli (STEC) NOT DETECTED NOT DETECTED Final   Shigella/Enteroinvasive E coli (EIEC) NOT DETECTED NOT DETECTED Final   Cryptosporidium NOT DETECTED NOT DETECTED Final   Cyclospora cayetanensis NOT DETECTED NOT DETECTED Final   Entamoeba histolytica NOT DETECTED NOT DETECTED Final   Giardia lamblia NOT DETECTED NOT DETECTED Final   Adenovirus F40/41 NOT DETECTED NOT  DETECTED Final   Astrovirus NOT DETECTED NOT DETECTED Final   Norovirus GI/GII NOT DETECTED NOT DETECTED Final   Rotavirus A NOT DETECTED NOT DETECTED Final   Sapovirus (I, II, IV, and V) NOT DETECTED NOT DETECTED Final    Comment: Performed at [facility], [address] [place], [place] [address]     Total time spend on discharging this patient, including the last patient exam, discussing the hospital stay, instructions for ongoing care as it relates to all pertinent caregivers, as well as preparing the medical discharge records, prescriptions, and/or referrals as applicable, is 50 minutes.    Enzo Bi, MD  [facility] 02/27/2021, 1:36 PM

## 2021-02-27 NOTE — Care Management Important Message (Signed)
Important Message  Patient Details  Name: DELONTAE LAMM MRN: [id number] Date of Birth: November 07, 1950   Medicare Important Message Given:  N/A - LOS <3 / Initial given by admissions     Juliann Pulse A Melora Menon 02/27/2021, 7:49 AM

## 2021-03-02 LAB — CULTURE, BLOOD (ROUTINE X 2)
Culture: NO GROWTH
Culture: NO GROWTH
Special Requests: ADEQUATE
Special Requests: ADEQUATE

## 2021-03-09 ENCOUNTER — Encounter: Payer: Self-pay | Admitting: Family Medicine

## 2021-03-09 ENCOUNTER — Ambulatory Visit (INDEPENDENT_AMBULATORY_CARE_PROVIDER_SITE_OTHER): Payer: Medicare Other | Admitting: Family Medicine

## 2021-03-09 ENCOUNTER — Other Ambulatory Visit: Payer: Self-pay

## 2021-03-09 VITALS — BP 104/65 | HR 73 | Resp 16 | Wt 194.6 lb

## 2021-03-09 DIAGNOSIS — Z6832 Body mass index (BMI) 32.0-32.9, adult: Secondary | ICD-10-CM | POA: Diagnosis not present

## 2021-03-09 DIAGNOSIS — N200 Calculus of kidney: Secondary | ICD-10-CM | POA: Diagnosis not present

## 2021-03-09 DIAGNOSIS — I2510 Atherosclerotic heart disease of native coronary artery without angina pectoris: Secondary | ICD-10-CM

## 2021-03-09 DIAGNOSIS — R7303 Prediabetes: Secondary | ICD-10-CM | POA: Diagnosis not present

## 2021-03-09 DIAGNOSIS — E6609 Other obesity due to excess calories: Secondary | ICD-10-CM

## 2021-03-09 NOTE — Progress Notes (Signed)
Established patient visit   Patient: Michael Leon   DOB: [birth year]   70 y.o. Male  MRN: [id number] Visit Date: 03/09/2021  Today's healthcare provider: Wilhemena Durie, MD   Chief Complaint  Patient presents with  . Hospitalization Follow-up   Subjective    HPI  Patient comes in today for follow-up.  He is doing well for hospitalization after urosepsis and need for instrumentation and procedure for kidney stones.  He is feeling much better and is almost back to normal. Follow up Hospitalization  Patient was admitted to [facility] on 02/25/2021 and discharged on 02/27/2021. He was treated for Sepsis. Treatment for this included starting patient on Sulfamethoxazole-trimethoprim 800-160mg . Telephone follow up was done on none He reports excellent compliance with treatment. He reports this condition is resolved.  ----------------------------------------------------------------------------------------- -    Patient Active Problem List   Diagnosis Date Noted  . Sepsis ([place]) 02/25/2021  . Personal history of colonic polyps   . Chest pain 12/30/2018  . Breathlessness on exertion 06/05/2015  . Dizziness 05/08/2015  . Arteriosclerosis of coronary artery 05/07/2015  . HLD (hyperlipidemia) 05/07/2015  . BP (high blood pressure) 05/07/2015  . Obesity 05/07/2015  . Depression 05/07/2015  . GAD (generalized anxiety disorder) 05/07/2015  . Insomnia 05/07/2015  . GERD (gastroesophageal reflux disease) 05/07/2015  . Gout 05/07/2015  . Cervical radiculopathy 05/07/2015  . Prediabetes 05/07/2015  . History of kidney stones 05/07/2015  . Benign essential HTN 05/05/2015  . Anxiety 04/30/2015  . Borderline diabetes 04/30/2015  . ST elevation (STEMI) myocardial infarction ([place]) 04/30/2015  . Blood glucose elevated 04/30/2015  . CHEST PAIN UNSPECIFIED 06/19/2009   Past Medical History:  Diagnosis Date  . Arthritis   . ASCVD (arteriosclerotic cardiovascular disease)   .  Cervical radiculopathy   . Chest pain, unspecified   . Coronary artery disease   . Depression   . Dysplastic nevus    left ear - treated by Dr. Sharlett Iles  . GAD (generalized anxiety disorder)   . GAD (generalized anxiety disorder)   . GERD (gastroesophageal reflux disease)   . Gout   . HLD (hyperlipidemia)   . Hypertension   . Kidney stone   . Obesity   . Pre-diabetes    borderline. patient unaware of this,  . Reflux   . Sleep apnea    CAN NOT USE HIS CPAP  . STEMI (ST elevation myocardial infarction) ([place]) 2016   2 STENTS  . Vertigo    none recently   Allergies  Allergen Reactions  . Hydrocodone Other (See Comments)    Cold sweats   . Codeine Other (See Comments)    Cold sweats (30 years ago) but has taken since with no reaction       Medications: Outpatient Medications Prior to Visit  Medication Sig  . aspirin EC 81 MG tablet Take 81 mg by mouth in the morning. Swallow whole.  Marland Kitchen atorvastatin (LIPITOR) 80 MG tablet Take 80 mg by mouth in the morning.  Marland Kitchen losartan (COZAAR) 50 MG tablet Take 1 tablet (50 mg total) by mouth daily.  . metFORMIN (GLUCOPHAGE) 500 MG tablet Take 1 tablet (500 mg total) by mouth 2 (two) times daily with a meal.  . pantoprazole (PROTONIX) 40 MG tablet TAKE 1 TABLET(40 MG) BY MOUTH TWICE DAILY (Patient taking differently: Take 40 mg by mouth 2 (two) times daily.)  . PARoxetine (PAXIL) 40 MG tablet TAKE 1 TABLET(40 MG) BY MOUTH DAILY (Patient taking differently: Take 40  mg by mouth daily.)  . tamsulosin (FLOMAX) 0.4 MG CAPS capsule Take 1 capsule (0.4 mg total) by mouth daily.   No facility-administered medications prior to visit.    Review of Systems  Constitutional: Negative for appetite change, chills and fever.  Respiratory: Negative for chest tightness, shortness of breath and wheezing.   Cardiovascular: Negative for chest pain and palpitations.  Gastrointestinal: Negative for abdominal pain, nausea and vomiting.        Objective     BP 104/65   Pulse 73   Resp 16   Wt 194 lb 9.6 oz (88.3 kg)   SpO2 97%   BMI 32.38 kg/m  BP Readings from Last 3 Encounters:  03/09/21 104/65  02/27/21 129/68  02/25/21 134/77   Wt Readings from Last 3 Encounters:  03/09/21 194 lb 9.6 oz (88.3 kg)  02/25/21 200 lb (90.7 kg)  02/25/21 195 lb (88.5 kg)       Physical Exam Vitals reviewed.  Constitutional:      Appearance: Normal appearance. He is well-developed.  HENT:     Head: Normocephalic and atraumatic.     Right Ear: Tympanic membrane and external ear normal.     Left Ear: Tympanic membrane and external ear normal.     Nose: Nose normal.     Mouth/Throat:     Mouth: Mucous membranes are moist.     Pharynx: Oropharynx is clear.  Eyes:     General: No scleral icterus.    Conjunctiva/sclera: Conjunctivae normal.  Cardiovascular:     Rate and Rhythm: Normal rate and regular rhythm.     Heart sounds: Normal heart sounds.  Pulmonary:     Effort: Pulmonary effort is normal.     Breath sounds: Normal breath sounds.  Abdominal:     Palpations: Abdomen is soft.  Lymphadenopathy:     Cervical: No cervical adenopathy.  Skin:    General: Skin is warm and dry.  Neurological:     General: No focal deficit present.     Mental Status: He is alert and oriented to person, place, and time.  Psychiatric:        Mood and Affect: Mood normal.        Behavior: Behavior normal.        Thought Content: Thought content normal.        Judgment: Judgment normal.       No results found for any visits on 03/09/21.  Assessment & Plan     1. Kidney stones Followed by urology.  2. Arteriosclerosis of coronary artery All risk factors treated  3. Borderline diabetes A1c on next visit late summer  4. Class 1 obesity due to excess calories with serious comorbidity and body mass index (BMI) of 32.0 to 32.9 in adult With diabetes heart disease and hypertension   No follow-ups on file.      I, Wilhemena Durie, MD, have  reviewed all documentation for this visit. The documentation on 03/15/21 for the exam, diagnosis, procedures, and orders are all accurate and complete.    Allien Melberg Cranford Mon, MD  [facility] [phone] (phone) [phone] (fax)  [place]

## 2021-03-10 ENCOUNTER — Telehealth: Payer: Self-pay

## 2021-03-10 MED ORDER — KETOROLAC TROMETHAMINE 10 MG PO TABS: 10.0000 mg | ORAL_TABLET | Freq: Four times a day (QID) | ORAL | 0 refills | Status: DC | PRN

## 2021-03-10 NOTE — Telephone Encounter (Signed)
Spoke with patient and he reports that he does not remember discussing any treatment during his visit yesterday. He wants to know what the Tramadol is for?

## 2021-03-10 NOTE — Telephone Encounter (Signed)
Copied from [place] [phone]. Topic: General - Other >> Mar 10, 2021 12:37 PM Tessa Lerner A wrote: Reason for CRM: Patient has received notification of a prescription submitted for them for ketorolac (TORADOL) 10 MG tablet   Patient was unaware that they would be prescribed a new medication and would like to discuss it further   Patient is also uncertain of the purpose of the medication   Please contact to advise further when possible

## 2021-03-23 NOTE — Telephone Encounter (Signed)
Mistakenly sent

## 2021-03-25 ENCOUNTER — Telehealth: Payer: Self-pay

## 2021-03-25 NOTE — Telephone Encounter (Signed)
Please advise? There is no medication for weight loss in his med list?

## 2021-03-25 NOTE — Telephone Encounter (Signed)
Copied from [place] [id number]. Topic: General - Other >> Mar 25, 2021  8:28 AM Leward Quan A wrote: Reason for CRM: Patient called in to inform Dr Rosanna Randy that the medication that was given to him to aid in his weight loss efforts he took one month and then threw the bottle out accidentally and forgot the name of the medication and now he need a refill. Any questions please call   Ph# [phone]

## 2021-03-31 ENCOUNTER — Other Ambulatory Visit: Payer: Self-pay | Admitting: Family Medicine

## 2021-03-31 DIAGNOSIS — F3342 Major depressive disorder, recurrent, in full remission: Secondary | ICD-10-CM

## 2021-04-01 ENCOUNTER — Telehealth: Payer: Self-pay

## 2021-04-01 DIAGNOSIS — Z6832 Body mass index (BMI) 32.0-32.9, adult: Secondary | ICD-10-CM

## 2021-04-01 NOTE — Telephone Encounter (Signed)
Patient said Dr. Rosanna Randy gave him a "weight loss" drug for 2 months.   He threw the bottle away and there was 1 refill left on it.   He said [facility] will not refill it unless Dr. Rosanna Randy calls them for the ok.   He did not know the name of the medicine.    [facility]

## 2021-04-02 MED ORDER — PHENTERMINE HCL 30 MG PO CAPS: 30.0000 mg | ORAL_CAPSULE | ORAL | 3 refills | Status: DC

## 2021-04-02 MED ORDER — PHENTERMINE HCL 30 MG PO CAPS
30.0000 mg | ORAL_CAPSULE | ORAL | 3 refills | Status: DC
Start: 2021-04-02 — End: 2021-05-14

## 2021-04-02 NOTE — Telephone Encounter (Signed)
Dr. Rosanna Randy, can you send in Rx. It keeps defaulting to print when I try to send. Pharmacy listed is correct.

## 2021-04-02 NOTE — Telephone Encounter (Signed)
Reviewed Med list history, and patient is referring to phentermine 30mg . Ok to refill?

## 2021-04-15 ENCOUNTER — Encounter: Payer: Medicare Other | Admitting: Family Medicine

## 2021-05-05 ENCOUNTER — Other Ambulatory Visit: Payer: Self-pay | Admitting: Family Medicine

## 2021-05-05 DIAGNOSIS — K219 Gastro-esophageal reflux disease without esophagitis: Secondary | ICD-10-CM

## 2021-05-05 NOTE — Telephone Encounter (Signed)
Requested medications are due for refill today.  yes  Requested medications are on the active medications list.  yes  Last refill. 02/19/2020  Future visit scheduled.   yes  Notes to clinic.  Prescription is expired.

## 2021-05-08 NOTE — Progress Notes (Signed)
Annual Wellness Visit     Patient: Michael Leon, Male    DOB: 1950-11-29, 70 y.o.   MRN: [id number] Visit Date: 05/11/2021  Today's Provider: Wilhemena Durie, MD   Chief Complaint  Patient presents with   Annual Exam   Subjective    Michael Leon is a 70 y.o. male who presents today for his Annual Wellness Visit. He reports consuming a general diet. Home exercise routine includes walking. He generally feels well. He reports sleeping well. He does not have additional problems to discuss today.  Feels well and is lost 10 pounds in the past couple of months with a low-carb diet.  He has no complaints today.     Medications: Outpatient Medications Prior to Visit  Medication Sig   aspirin EC 81 MG tablet Take 81 mg by mouth in the morning. Swallow whole.   atorvastatin (LIPITOR) 80 MG tablet Take 80 mg by mouth in the morning.   ketorolac (TORADOL) 10 MG tablet Take 1 tablet (10 mg total) by mouth every 6 (six) hours as needed.   losartan (COZAAR) 50 MG tablet Take 1 tablet (50 mg total) by mouth daily.   metFORMIN (GLUCOPHAGE) 500 MG tablet Take 1 tablet (500 mg total) by mouth 2 (two) times daily with a meal.   pantoprazole (PROTONIX) 40 MG tablet Take 1 tablet (40 mg total) by mouth 2 (two) times daily.   PARoxetine (PAXIL) 40 MG tablet TAKE 1 TABLET(40 MG) BY MOUTH DAILY   phentermine 30 MG capsule Take 1 capsule (30 mg total) by mouth every morning.   tamsulosin (FLOMAX) 0.4 MG CAPS capsule Take 1 capsule (0.4 mg total) by mouth daily.   No facility-administered medications prior to visit.    Allergies  Allergen Reactions   Hydrocodone Other (See Comments)    Cold sweats    Codeine Other (See Comments)    Cold sweats (30 years ago) but has taken since with no reaction    Patient Care Team: Jerrol Banana., MD as PCP - General (Family Medicine) Corey Skains, MD as Consulting Physician (Cardiology) [name], MD as Consulting Physician  (Gastroenterology) Anell Barr, OD (Optometry)  Review of Systems  All other systems reviewed and are negative.       Objective    Vitals: BP 124/82   Pulse 66   Temp 99.4 F (37.4 C)   Resp 16   Ht 5' 4.5" (1.638 m)   Wt 186 lb (84.4 kg)   BMI 31.43 kg/m  BP Readings from Last 3 Encounters:  05/11/21 124/82  03/09/21 104/65  02/27/21 129/68   Wt Readings from Last 3 Encounters:  05/11/21 186 lb (84.4 kg)  03/09/21 194 lb 9.6 oz (88.3 kg)  02/25/21 200 lb (90.7 kg)      Physical Exam Vitals reviewed.  Constitutional:      Appearance: Normal appearance.  HENT:     Head: Normocephalic and atraumatic.     Right Ear: Tympanic membrane and external ear normal.     Left Ear: Tympanic membrane and external ear normal.     Mouth/Throat:     Mouth: Mucous membranes are moist.     Pharynx: Oropharynx is clear.  Eyes:     General: No scleral icterus.    Conjunctiva/sclera: Conjunctivae normal.  Neck:     Vascular: No carotid bruit.  Cardiovascular:     Rate and Rhythm: Normal rate and regular rhythm.  Pulses: Normal pulses.     Heart sounds: Normal heart sounds.  Pulmonary:     Effort: Pulmonary effort is normal.     Breath sounds: Normal breath sounds.  Abdominal:     Palpations: Abdomen is soft.  Genitourinary:    Penis: Normal.      Testes: Normal.  Musculoskeletal:     Right lower leg: No edema.     Left lower leg: No edema.  Lymphadenopathy:     Cervical: No cervical adenopathy.  Skin:    General: Skin is warm and dry.  Neurological:     General: No focal deficit present.     Mental Status: He is alert and oriented to person, place, and time.  Psychiatric:        Mood and Affect: Mood normal.        Behavior: Behavior normal.        Thought Content: Thought content normal.        Judgment: Judgment normal.     Most recent functional status assessment: In your present state of health, do you have any difficulty performing the following  activities: 02/25/2021  Hearing? -  Vision? -  Difficulty concentrating or making decisions? -  Walking or climbing stairs? -  Dressing or bathing? N  Doing errands, shopping? N  Some recent data might be hidden   Most recent fall risk assessment: Fall Risk  02/11/2021  Falls in the past year? 1  Comment -  Number falls in past yr: 0  Comment -  Injury with Fall? 1  Risk for fall due to : History of fall(s)  Follow up Falls evaluation completed;Education provided;Falls prevention discussed    Most recent depression screenings: PHQ 2/9 Scores 02/11/2021 11/05/2020  PHQ - 2 Score 2 1  PHQ- 9 Score 5 2   Most recent cognitive screening: No flowsheet data found. Most recent Audit-C alcohol use screening Alcohol Use Disorder Test (AUDIT) 02/11/2021  1. How often do you have a drink containing alcohol? 3  2. How many drinks containing alcohol do you have on a typical day when you are drinking? 0  3. How often do you have six or more drinks on one occasion? 0  AUDIT-C Score 3  4. How often during the last year have you found that you were not able to stop drinking once you had started? 0  5. How often during the last year have you failed to do what was normally expected from you because of drinking? 0  6. How often during the last year have you needed a first drink in the morning to get yourself going after a heavy drinking session? 0  7. How often during the last year have you had a feeling of guilt of remorse after drinking? 0  8. How often during the last year have you been unable to remember what happened the night before because you had been drinking? 0  9. Have you or someone else been injured as a result of your drinking? 0  10. Has a relative or friend or a doctor or another health worker been concerned about your drinking or suggested you cut down? 0  Alcohol Use Disorder Identification Test Final Score (AUDIT) 3  Alcohol Brief Interventions/Follow-up -   A score of 3 or more  in women, and 4 or more in men indicates increased risk for alcohol abuse, EXCEPT if all of the points are from question 1   No results found for any visits  on 05/11/21.  Assessment & Plan     Annual wellness visit done today including the all of the following: Reviewed patient's Family Medical History Reviewed and updated list of patient's medical providers Assessment of cognitive impairment was done Assessed patient's functional ability Established a written schedule for health screening Leon Completed and Reviewed  Exercise Activities and Dietary recommendations  Goals      DIET - INCREASE WATER INTAKE     Recommend to drink at least 6-8 8oz glasses of water per day.        Immunization History  Administered Date(s) Administered   Fluad Quad(high Dose 65+) 09/06/2019, 07/30/2020   Influenza, High Dose Seasonal PF 08/16/2018   Influenza-Unspecified 10/23/2015   Moderna Sars-Covid-2 Vaccination 12/04/2019, 01/01/2020, 07/01/2020   Pneumococcal Conjugate-13 11/29/2017   Td 11/16/1990   Tdap 03/14/2013   Zoster, Live 07/17/2015    Health Maintenance  Topic Date Due   FOOT EXAM  Never done   OPHTHALMOLOGY EXAM  Never done   Zoster Vaccines- Shingrix (1 of 2) Never done   PNA vac Low Risk Adult (2 of 2 - PPSV23) 11/29/2018   COVID-19 Vaccine (4 - Booster for Moderna series) 09/30/2020   INFLUENZA VACCINE  05/18/2021   HEMOGLOBIN A1C  08/28/2021   TETANUS/TDAP  03/15/2023   COLONOSCOPY (Pts 45-69yr Insurance coverage will need to be confirmed)  12/30/2024   Hepatitis C Screening  Completed   HPV VACCINES  Aged Out   1. Encounter for Medicare annual wellness exam   2. OAB (overactive bladder) Followed by urology - POCT urinalysis dipstick - CULTURE, URINE COMPREHENSIVE  3. Benign essential HTN Good control especially with recent weight loss  4. Arteriosclerosis of coronary artery All risk factors treated and patient on atorvastatin  at 80 mg daily  5. Prediabetes Follow-up A1c as last was 7.4 and we will need to do that in a month or 2  6. Kidney stones Followed by urology  7. Benign prostatic hyperplasia with lower urinary tract symptoms, symptom details unspecified   8. OSA on CPAP On CPAP and working on weight loss  9. Mixed hyperlipidemia Atorvastatin 80   Discussed health benefits of physical activity, and encouraged him to engage in regular exercise appropriate for his age and condition.      No follow-ups on file.     I, RWilhemena Durie MD, have reviewed all documentation for this visit. The documentation on 05/13/21 for the exam, diagnosis, procedures, and orders are all accurate and complete.    Other Atienza GCranford Mon MD  [phone](phone) [phone](fax)  [place]

## 2021-05-11 ENCOUNTER — Encounter: Payer: Self-pay | Admitting: Family Medicine

## 2021-05-11 ENCOUNTER — Ambulatory Visit (INDEPENDENT_AMBULATORY_CARE_PROVIDER_SITE_OTHER): Payer: Medicare Other | Admitting: Family Medicine

## 2021-05-11 ENCOUNTER — Other Ambulatory Visit: Payer: Self-pay

## 2021-05-11 VITALS — BP 124/82 | HR 66 | Temp 99.4°F | Resp 16 | Ht 64.5 in | Wt 186.0 lb

## 2021-05-11 DIAGNOSIS — R7303 Prediabetes: Secondary | ICD-10-CM

## 2021-05-11 DIAGNOSIS — Z9989 Dependence on other enabling machines and devices: Secondary | ICD-10-CM

## 2021-05-11 DIAGNOSIS — I10 Essential (primary) hypertension: Secondary | ICD-10-CM

## 2021-05-11 DIAGNOSIS — G4733 Obstructive sleep apnea (adult) (pediatric): Secondary | ICD-10-CM | POA: Diagnosis not present

## 2021-05-11 DIAGNOSIS — N200 Calculus of kidney: Secondary | ICD-10-CM | POA: Diagnosis not present

## 2021-05-11 DIAGNOSIS — N401 Enlarged prostate with lower urinary tract symptoms: Secondary | ICD-10-CM | POA: Diagnosis not present

## 2021-05-11 DIAGNOSIS — E782 Mixed hyperlipidemia: Secondary | ICD-10-CM

## 2021-05-11 DIAGNOSIS — Z0000 Encounter for general adult medical examination without abnormal findings: Secondary | ICD-10-CM | POA: Diagnosis not present

## 2021-05-11 DIAGNOSIS — N3281 Overactive bladder: Secondary | ICD-10-CM | POA: Diagnosis not present

## 2021-05-11 DIAGNOSIS — I2510 Atherosclerotic heart disease of native coronary artery without angina pectoris: Secondary | ICD-10-CM

## 2021-05-11 LAB — POCT URINALYSIS DIPSTICK
Bilirubin, UA: NEGATIVE
Blood, UA: POSITIVE
Glucose, UA: NEGATIVE
Ketones, UA: NEGATIVE
Leukocytes, UA: NEGATIVE
Nitrite, UA: NEGATIVE
Protein, UA: NEGATIVE
Spec Grav, UA: 1.01 (ref 1.010–1.025)
Urobilinogen, UA: 0.2 E.U./dL
pH, UA: 5 (ref 5.0–8.0)

## 2021-05-11 NOTE — Patient Instructions (Signed)
Push Fluids and Try One Glass Of Cranberry Juice Daily.

## 2021-05-12 ENCOUNTER — Telehealth: Payer: Self-pay

## 2021-05-12 NOTE — Telephone Encounter (Signed)
Copied from [place] [phone]. Topic: General - Other >> May 12, 2021  8:06 AM Valere Dross wrote: Reason for CRM: Pt called in stating lab results with blood in his urine, and was wondering if PCP was going to send him someone, pt requested a call back. Please advise.

## 2021-05-12 NOTE — Telephone Encounter (Signed)
Please advise? Urine was sent for culture.

## 2021-05-13 NOTE — Telephone Encounter (Signed)
Patient was advised.  

## 2021-05-14 ENCOUNTER — Other Ambulatory Visit: Payer: Self-pay

## 2021-05-14 ENCOUNTER — Encounter: Payer: Self-pay | Admitting: Urology

## 2021-05-14 ENCOUNTER — Ambulatory Visit (INDEPENDENT_AMBULATORY_CARE_PROVIDER_SITE_OTHER): Payer: Medicare Other | Admitting: Urology

## 2021-05-14 VITALS — BP 147/77 | HR 68 | Ht 64.5 in | Wt 186.3 lb

## 2021-05-14 DIAGNOSIS — Z87448 Personal history of other diseases of urinary system: Secondary | ICD-10-CM | POA: Diagnosis not present

## 2021-05-14 DIAGNOSIS — N200 Calculus of kidney: Secondary | ICD-10-CM

## 2021-05-14 LAB — CULTURE, URINE COMPREHENSIVE

## 2021-05-14 NOTE — Progress Notes (Signed)
   05/14/2021 4:31 PM   SABINO EVELYN 25-Nov-1950 [id number]  Reason for visit: Hematuria  HPI: 70 year old male who underwent a ureteroscopy for right distal ureteral stone in May 2022 and developed a UTI requiring hospitalization after he removed his stent and forgot to take the antibiotic prior to removal.  He is recovered from this and denies any complaints today.  Urinalysis was done by his PCP and showed dipstick positive blood and he requested a follow-up appointment with urology.  He denies any gross hematuria, flank pain, or urinary symptoms.  Urinalysis today in clinic is completely benign with 0-5 WBCs, 0 RBCs, no leukocytes, nitrate negative.  His cystoscopy at time of surgery was normal and showed no bladder abnormalities.  Reassurance was provided.  Stone prevention strategies discussed.  Follow-up with urology as needed  [place], [place] [address], [place], [place] [address] [phone]

## 2021-05-15 LAB — URINALYSIS, COMPLETE
Bilirubin, UA: NEGATIVE
Glucose, UA: NEGATIVE
Ketones, UA: NEGATIVE
Leukocytes,UA: NEGATIVE
Nitrite, UA: NEGATIVE
Protein,UA: NEGATIVE
RBC, UA: NEGATIVE
Specific Gravity, UA: 1.025 (ref 1.005–1.030)
Urobilinogen, Ur: 0.2 mg/dL (ref 0.2–1.0)
pH, UA: 5.5 (ref 5.0–7.5)

## 2021-05-15 LAB — MICROSCOPIC EXAMINATION
Bacteria, UA: NONE SEEN
Epithelial Cells (non renal): NONE SEEN /hpf (ref 0–10)
RBC, Urine: NONE SEEN /hpf (ref 0–2)

## 2021-06-21 ENCOUNTER — Other Ambulatory Visit: Payer: Self-pay

## 2021-06-21 ENCOUNTER — Ambulatory Visit
Admission: EM | Admit: 2021-06-21 | Discharge: 2021-06-21 | Disposition: A | Discharge status: Home / Self Care | Payer: Medicare Other | Attending: Internal Medicine | Admitting: Internal Medicine

## 2021-06-21 DIAGNOSIS — U071 COVID-19: Secondary | ICD-10-CM | POA: Diagnosis not present

## 2021-06-21 MED ORDER — BENZONATATE 200 MG PO CAPS: 200.0000 mg | ORAL_CAPSULE | Freq: Two times a day (BID) | ORAL | 0 refills | Status: DC | PRN

## 2021-06-21 MED ORDER — ACETAMINOPHEN 500 MG PO TABS
500.0000 mg | ORAL_TABLET | Freq: Once | ORAL | Status: AC
  Administered 2021-06-21: 500 mg via ORAL

## 2021-06-21 MED ORDER — MOLNUPIRAVIR EUA 200MG CAPSULE: 4.0000 | ORAL_CAPSULE | Freq: Two times a day (BID) | ORAL | 0 refills | Status: AC

## 2021-06-21 NOTE — ED Triage Notes (Signed)
Pt here with C/O Covid positive at home today, cough, body aches. Would like RX to help with SX

## 2021-06-21 NOTE — ED Provider Notes (Signed)
[facility]    CSN: [id number] Arrival date & time: 06/21/21  1515      History   Chief Complaint Chief Complaint  Patient presents with   Covid Positive   Generalized Body Aches   Cough    HPI Michael Leon is a 70 y.o. male who presents with onset of cough, and body aches, fatigue and fever since yesterday. Did a home covid test today and was positive and called him PCP to get on the antiviral med, but she told him he needed to be examined. Appetite is down. Has never had covid.    Past Medical History:  Diagnosis Date   Arthritis    ASCVD (arteriosclerotic cardiovascular disease)    Cervical radiculopathy    Chest pain, unspecified    Coronary artery disease    Depression    Dysplastic nevus    left ear - treated by Dr. Sharlett Iles   GAD (generalized anxiety disorder)    GAD (generalized anxiety disorder)    GERD (gastroesophageal reflux disease)    Gout    HLD (hyperlipidemia)    Hypertension    Kidney stone    Obesity    Pre-diabetes    borderline. patient unaware of this,   Reflux    Sleep apnea    CAN NOT USE HIS CPAP   STEMI (ST elevation myocardial infarction) ([place]) 2016   2 STENTS   Vertigo    none recently    Patient Active Problem List   Diagnosis Date Noted   Sepsis ([place]) 02/25/2021   Personal history of colonic polyps    Chest pain 12/30/2018   Breathlessness on exertion 06/05/2015   Dizziness 05/08/2015   Arteriosclerosis of coronary artery 05/07/2015   HLD (hyperlipidemia) 05/07/2015   BP (high blood pressure) 05/07/2015   Obesity 05/07/2015   Depression 05/07/2015   GAD (generalized anxiety disorder) 05/07/2015   Insomnia 05/07/2015   GERD (gastroesophageal reflux disease) 05/07/2015   Gout 05/07/2015   Cervical radiculopathy 05/07/2015   Prediabetes 05/07/2015   History of kidney stones 05/07/2015   Benign essential HTN 05/05/2015   Anxiety 04/30/2015   Borderline diabetes 04/30/2015   ST elevation (STEMI)  myocardial infarction ([place]) 04/30/2015   Blood glucose elevated 04/30/2015   CHEST PAIN UNSPECIFIED 06/19/2009    Past Surgical History:  Procedure Laterality Date   ANTERIOR CRUCIATE LIGAMENT REPAIR Right 1993, 2005   [place]  2016   stent   CHOLECYSTECTOMY     COLONOSCOPY WITH PROPOFOL N/A 12/31/2019   Procedure: COLONOSCOPY WITH PROPOFOL;  Surgeon: [name], MD;  Location: [facility];  Service: Endoscopy;  Laterality: N/A;   CORONARY STENT PLACEMENT     CYSTOSCOPY/URETEROSCOPY/HOLMIUM LASER/STENT PLACEMENT Right 02/20/2021   Procedure: CYSTOSCOPY/URETEROSCOPY/HOLMIUM LASER/STENT PLACEMENT;  Surgeon: Billey Co, MD;  Location: [facility];  Service: Urology;  Laterality: Right;   EXTRACORPOREAL SHOCK WAVE LITHOTRIPSY Right 01/22/2021   Procedure: EXTRACORPOREAL SHOCK WAVE LITHOTRIPSY (ESWL);  Surgeon: Hollice Espy, MD;  Location: [facility];  Service: Urology;  Laterality: Right;   EYE SURGERY Left    SHOULDER ARTHROSCOPY WITH ROTATOR CUFF REPAIR AND SUBACROMIAL DECOMPRESSION Right 10/23/2020   Procedure: RIGHT SHOULDER ARTHROSCOPY WITH DEB RIDEMENT;  Surgeon: Thornton Park, MD;  Location: [facility];  Service: Orthopedics;  Laterality: Right;   TONSILLECTOMY     UPPER GI ENDOSCOPY         Home Medications    Prior to Admission medications  Medication Sig Start Date End Date Taking? Authorizing Provider  aspirin EC 81 MG tablet Take 81 mg by mouth in the morning. Swallow whole.   Yes [provider]  atorvastatin (LIPITOR) 80 MG tablet Take 80 mg by mouth in the morning. 08/27/19  Yes [provider]  benzonatate (TESSALON) 200 MG capsule Take 1 capsule (200 mg total) by mouth 2 (two) times daily as needed for cough. 06/21/21  Yes Rodriguez-Southworth, Sunday Spillers, PA-C  losartan (COZAAR) 50 MG tablet Take 1 tablet (50 mg total) by mouth daily. 06/26/18  Yes Jerrol Banana., MD  molnupiravir EUA 200 mg CAPS  Take 4 capsules (800 mg total) by mouth 2 (two) times daily for 5 days. 06/21/21 06/26/21 Yes Rodriguez-Southworth, Sunday Spillers, PA-C  pantoprazole (PROTONIX) 40 MG tablet Take 1 tablet (40 mg total) by mouth 2 (two) times daily. 05/06/21  Yes Jerrol Banana., MD  PARoxetine (PAXIL) 40 MG tablet TAKE 1 TABLET(40 MG) BY MOUTH DAILY 03/31/21  Yes Jerrol Banana., MD    Family History Family History  Problem Relation Age of Onset   Cancer Mother        lung   Stroke Mother    Multiple sclerosis Mother    Heart attack Father    Bladder Cancer Maternal Grandfather    Heart disease Maternal Grandfather    Prostate cancer Neg Hx    Kidney disease Neg Hx     Social History Social History   Tobacco Use   Smoking status: Former    Packs/day: 1.50    Years: 15.00    Pack years: 22.50    Types: Cigarettes    Quit date: 10/18/2002    Years since quitting: 18.6   Smokeless tobacco: Current    Types: Chew   Tobacco comments:    Dips  Vaping Use   Vaping Use: Never used  Substance Use Topics   Alcohol use: Yes    Alcohol/week: 7.0 - 14.0 standard drinks    Types: 7 - 14 Cans of beer per week    Comment: 1-2 "lite" beers per day   Drug use: No     Allergies   Hydrocodone and Codeine   Review of Systems Review of Systems  Constitutional:  Positive for appetite change, chills, fatigue and fever. Negative for diaphoresis.  HENT:  Negative for congestion, rhinorrhea, sore throat and trouble swallowing.   Respiratory:  Positive for cough. Negative for chest tightness and shortness of breath.   Cardiovascular:  Negative for chest pain.  Gastrointestinal:  Negative for diarrhea, nausea and vomiting.  Musculoskeletal:  Positive for myalgias.  Neurological:  Positive for headaches.  Hematological:  Negative for adenopathy.    Physical Exam Triage Vital Signs ED Triage Vitals  Enc Vitals Group     BP 06/21/21 1546 138/80     Pulse Rate 06/21/21 1546 78     Resp 06/21/21 1546  18     Temp 06/21/21 1546 (!) 101.4 F (38.6 C)     Temp Source 06/21/21 1546 Oral     SpO2 06/21/21 1546 99 %     Weight 06/21/21 1544 186 lb 9.6 oz (84.6 kg)     Height --      Head Circumference --      Peak Flow --      Pain Score 06/21/21 1546 7     Pain Loc --      Pain Edu? --      Excl. in  GC? --    No data found.  Updated Vital Signs BP 138/80 (BP Location: Left Arm)   Pulse 78   Temp (!) 101.4 F (38.6 C) (Oral)   Resp 18   Wt 186 lb 9.6 oz (84.6 kg)   SpO2 99%   BMI 31.54 kg/m   Visual Acuity Right Eye Distance:   Left Eye Distance:   Bilateral Distance:    Right Eye Near:   Left Eye Near:    Bilateral Near:     Physical Exam Physical Exam Vitals signs and nursing note reviewed.  Constitutional:      General: he is not in acute distress.    Appearance: Normal appearance, he is not ill-appearing, toxic-appearing or diaphoretic.  HENT:     Head: Normocephalic. He is flushed, but still has great attitude and jokes a lot    Right Ear: Tympanic membrane, ear canal and external ear normal.     Left Ear: Tympanic membrane, ear canal and external ear normal.     Nose: Nose normal.     Mouth/Throat:     Mouth: Mucous membranes are moist.  Eyes:     General: No scleral icterus.       Right eye: No discharge.        Left eye: No discharge.     Conjunctiva/sclera: Conjunctivae normal.  Neck:     Musculoskeletal: Neck supple. No neck rigidity.  Cardiovascular:     Rate and Rhythm: Normal rate and regular rhythm.     Heart sounds: No murmur.  Pulmonary:     Effort: Pulmonary effort is normal.     Breath sounds: Normal breath sounds.   Musculoskeletal: Normal range of motion.  Lymphadenopathy:     Cervical: No cervical adenopathy.  Skin:    General: Skin is warm and dry.     Coloration: Skin is not jaundiced.     Findings: No rash.  Neurological:     Mental Status: he is alert and oriented to person, place, and time.     Gait: Gait normal.   Psychiatric:        Mood and Affect: Mood normal.        Behavior: Behavior normal.        Thought Content: Thought content normal.        Judgment: Judgment normal.    UC Treatments / Results  Labs (all labs ordered are listed, but only abnormal results are displayed) Labs Reviewed - No data to display  EKG   Radiology No results found.  Procedures Procedures (including critical care time)  Medications Ordered in UC Medications  acetaminophen (TYLENOL) tablet 500 mg (500 mg Oral Given 06/21/21 1555)    Initial Impression / Assessment and Plan / UC Course  I have reviewed the triage vital signs and the nursing notes. Has covid infection. I placed on Tessalon and Molnupiravir as noted.  See instruction.   Final Clinical Impressions(s) / UC Diagnoses   Final diagnoses:  [id number] virus infection     Discharge Instructions       Take Zinc 50 mg ones a day x 7 days. Zinc helps decrease the virus load in your body. Take Melatonin 6-10 mg at bed time which also helps support your immune system.  Also make sure to take Vit D 5,000 IU per day with a fatty meal and Vit C 5000 mg a day until you are completely better. To prevent viral illnesses your vitamin D should be between 60-80.  Stay on Vitamin D 2,000  and C  1000 mg the rest of the season.  Don't lay around, keep active and walk as much as you are able to to prevent worsening of your symptoms.  Follow up with your family Dr next week.  If you get short of breath and you are able to check  your oxygen with a pulse oxygen meter, if it gets to 92% or less, you need to go to the hospital to be admitted. If you dont have one, come back here and we will assess you.       ED Prescriptions     Medication Sig Dispense Auth. Provider   molnupiravir EUA 200 mg CAPS Take 4 capsules (800 mg total) by mouth 2 (two) times daily for 5 days. 40 capsule Rodriguez-Southworth, Eily Louvier, PA-C   benzonatate (TESSALON) 200 MG capsule  Take 1 capsule (200 mg total) by mouth 2 (two) times daily as needed for cough. 30 capsule Rodriguez-Southworth, Sunday Spillers, PA-C      PDMP not reviewed this encounter.   Shelby Mattocks, [place] 06/21/21 1638

## 2021-06-21 NOTE — Discharge Instructions (Addendum)
  Take Zinc 50 mg ones a day x 7 days. Zinc helps decrease the virus load in your body. Take Melatonin 6-10 mg at bed time which also helps support your immune system.  Also make sure to take Vit D 5,000 IU per day with a fatty meal and Vit C 5000 mg a day until you are completely better. To prevent viral illnesses your vitamin D should be between 60-80. Stay on Vitamin D 2,000  and C  1000 mg the rest of the season.  Don't lay around, keep active and walk as much as you are able to to prevent worsening of your symptoms.  Follow up with your family Dr next week.  If you get short of breath and you are able to check  your oxygen with a pulse oxygen meter, if it gets to 92% or less, you need to go to the hospital to be admitted. If you dont have one, come back here and we will assess you.

## 2021-07-13 ENCOUNTER — Ambulatory Visit: Payer: Self-pay | Admitting: Family Medicine

## 2021-07-15 ENCOUNTER — Other Ambulatory Visit: Payer: Self-pay

## 2021-07-15 ENCOUNTER — Encounter: Payer: Self-pay | Admitting: Family Medicine

## 2021-07-15 ENCOUNTER — Ambulatory Visit (INDEPENDENT_AMBULATORY_CARE_PROVIDER_SITE_OTHER): Payer: Medicare Other | Admitting: Family Medicine

## 2021-07-15 VITALS — BP 130/78 | HR 61 | Resp 16 | Ht 65.0 in | Wt 181.0 lb

## 2021-07-15 DIAGNOSIS — R7303 Prediabetes: Secondary | ICD-10-CM | POA: Diagnosis not present

## 2021-07-15 DIAGNOSIS — I2510 Atherosclerotic heart disease of native coronary artery without angina pectoris: Secondary | ICD-10-CM | POA: Diagnosis not present

## 2021-07-15 DIAGNOSIS — Z23 Encounter for immunization: Secondary | ICD-10-CM | POA: Diagnosis not present

## 2021-07-15 LAB — POCT GLYCOSYLATED HEMOGLOBIN (HGB A1C)
Est. average glucose Bld gHb Est-mCnc: 126
Hemoglobin A1C: 6 % — AB (ref 4.0–5.6)

## 2021-07-15 NOTE — Progress Notes (Signed)
I,April Miller,acting as a scribe for Wilhemena Durie, MD.,have documented all relevant documentation on the behalf of Wilhemena Durie, MD,as directed by  Wilhemena Durie, MD while in the presence of Wilhemena Durie, MD.   Established patient visit   Patient: Michael Leon   DOB: Jul 24, 1951   70 y.o. Male  MRN: [id number] Visit Date: 07/15/2021  Today's healthcare provider: Wilhemena Durie, MD   Chief Complaint  Patient presents with   Follow-up   Prediabetes   Subjective    HPI   Follow up for Prediabetes  The patient was last seen for this 4 months ago. Changes made at last visit include; last A1c checked 02/25/2021.  He reports good compliance with treatment. He feels that condition is Unchanged. He is not having side effects. none  -----------------------------------------------------------------------------------------     Medications: Outpatient Medications Prior to Visit  Medication Sig   aspirin EC 81 MG tablet Take 81 mg by mouth in the morning. Swallow whole.   atorvastatin (LIPITOR) 80 MG tablet Take 80 mg by mouth in the morning.   losartan (COZAAR) 50 MG tablet Take 1 tablet (50 mg total) by mouth daily.   pantoprazole (PROTONIX) 40 MG tablet Take 1 tablet (40 mg total) by mouth 2 (two) times daily.   PARoxetine (PAXIL) 40 MG tablet TAKE 1 TABLET(40 MG) BY MOUTH DAILY   [DISCONTINUED] benzonatate (TESSALON) 200 MG capsule Take 1 capsule (200 mg total) by mouth 2 (two) times daily as needed for cough. (Patient not taking: Reported on 07/15/2021)   No facility-administered medications prior to visit.    Review of Systems      Objective    BP 130/78 (BP Location: Left Arm, Patient Position: Sitting, Cuff Size: Normal)   Pulse 61   Resp 16   Ht 5\' 5"  (1.651 m)   Wt 181 lb (82.1 kg)   SpO2 96%   BMI 30.12 kg/m  BP Readings from Last 3 Encounters:  07/15/21 130/78  06/21/21 138/80  05/14/21 (!) 147/77   Wt Readings from Last  3 Encounters:  07/15/21 181 lb (82.1 kg)  06/21/21 186 lb 9.6 oz (84.6 kg)  05/14/21 186 lb 4.8 oz (84.5 kg)      Physical Exam Vitals reviewed.  Constitutional:      Appearance: Normal appearance.  HENT:     Head: Normocephalic and atraumatic.     Right Ear: Tympanic membrane and external ear normal.     Left Ear: Tympanic membrane and external ear normal.     Mouth/Throat:     Mouth: Mucous membranes are moist.     Pharynx: Oropharynx is clear.  Eyes:     General: No scleral icterus.    Conjunctiva/sclera: Conjunctivae normal.  Neck:     Vascular: No carotid bruit.  Cardiovascular:     Rate and Rhythm: Normal rate and regular rhythm.     Pulses: Normal pulses.     Heart sounds: Normal heart sounds.  Pulmonary:     Effort: Pulmonary effort is normal.  Abdominal:     Palpations: Abdomen is soft.  Genitourinary:    Penis: Normal.      Testes: Normal.  Musculoskeletal:     Right lower leg: No edema.     Left lower leg: No edema.  Lymphadenopathy:     Cervical: No cervical adenopathy.  Skin:    General: Skin is warm and dry.  Neurological:     General: No focal deficit  present.     Mental Status: He is alert and oriented to person, place, and time.  Psychiatric:        Mood and Affect: Mood normal.        Behavior: Behavior normal.        Thought Content: Thought content normal.        Judgment: Judgment normal.      Results for orders placed or performed in visit on 07/15/21  POCT glycosylated hemoglobin (Hb A1C)  Result Value Ref Range   Hemoglobin A1C 6.0 (A) 4.0 - 5.6 %   Est. average glucose Bld gHb Est-mCnc 126     Assessment & Plan     1. Prediabetes A1c under good control at 6.0 on diet and exercise alone.  He has lost 5 pounds since his last visit  - POCT glycosylated hemoglobin (Hb A1C)  2. Need for influenza vaccination  - Flu Vaccine QUAD High Dose(Fluad)  3. Need for pneumococcal vaccination  - Pneumococcal polysaccharide vaccine  23-valent greater than or equal to 2yo subcutaneous/IM   No follow-ups on file.      I, Wilhemena Durie, MD, have reviewed all documentation for this visit. The documentation on 07/17/21 for the exam, diagnosis, procedures, and orders are all accurate and complete.    Lauree Yurick Cranford Mon, MD  [facility] [phone] (phone) [phone] (fax)  [place]

## 2021-11-02 ENCOUNTER — Other Ambulatory Visit: Payer: Self-pay | Admitting: *Deleted

## 2021-11-02 DIAGNOSIS — N50812 Left testicular pain: Secondary | ICD-10-CM

## 2021-11-03 ENCOUNTER — Ambulatory Visit (INDEPENDENT_AMBULATORY_CARE_PROVIDER_SITE_OTHER): Payer: Medicare Other | Admitting: Urology

## 2021-11-03 ENCOUNTER — Other Ambulatory Visit
Admission: RE | Admit: 2021-11-03 | Discharge: 2021-11-03 | Disposition: A | Discharge status: Home / Self Care | Payer: Medicare Other | Attending: Urology | Admitting: Urology

## 2021-11-03 ENCOUNTER — Other Ambulatory Visit: Payer: Self-pay

## 2021-11-03 ENCOUNTER — Encounter: Payer: Self-pay | Admitting: Urology

## 2021-11-03 VITALS — BP 149/80 | HR 92 | Ht 65.0 in | Wt 195.0 lb

## 2021-11-03 DIAGNOSIS — N401 Enlarged prostate with lower urinary tract symptoms: Secondary | ICD-10-CM

## 2021-11-03 DIAGNOSIS — N138 Other obstructive and reflux uropathy: Secondary | ICD-10-CM

## 2021-11-03 DIAGNOSIS — N50812 Left testicular pain: Secondary | ICD-10-CM | POA: Diagnosis not present

## 2021-11-03 DIAGNOSIS — R102 Pelvic and perineal pain: Secondary | ICD-10-CM

## 2021-11-03 LAB — URINALYSIS, COMPLETE (UACMP) WITH MICROSCOPIC
Bacteria, UA: NONE SEEN
Bilirubin Urine: NEGATIVE
Glucose, UA: NEGATIVE mg/dL
Hgb urine dipstick: NEGATIVE
Leukocytes,Ua: NEGATIVE
Nitrite: NEGATIVE
Protein, ur: NEGATIVE mg/dL
Specific Gravity, Urine: 1.025 (ref 1.005–1.030)
pH: 5 (ref 5.0–8.0)

## 2021-11-03 MED ORDER — TAMSULOSIN HCL 0.4 MG PO CAPS: 0.4000 mg | ORAL_CAPSULE | Freq: Every day | ORAL | 11 refills | Status: DC

## 2021-11-03 MED ORDER — CELECOXIB 200 MG PO CAPS: 200.0000 mg | ORAL_CAPSULE | Freq: Two times a day (BID) | ORAL | 0 refills | Status: DC

## 2021-11-03 NOTE — Progress Notes (Signed)
° °  11/03/2021 3:54 PM   Michael Leon 1951/01/05 [id number]  Reason for visit: Left lower quadrant pain, urinary dribbling  HPI: 71 year old male with history of right proximal ureteral stone in March 2022 who opted for shockwave lithotripsy.  He had persistent small fragments that migrated distally and underwent right ureteroscopy in May 2202 that was complicated by febrile UTI when he did not take the antibiotic prior to stent removal.  He reports a long history of intermittent scrotal pain over the last few years, but primary complaint today is left lower quadrant abdominal pain with some occasional pain moves down to the left testicle.  He also reports some urinary dribbling after voiding.  He denies any dysuria.  His pain and discomfort comes and goes.  Urinalysis today is completely benign.  On exam testicles are 20 cc and descended bilaterally, no penile lesions, patent meatus.  Mild left testicular pain to palpation.  His primary pain is more abdominal, but no tenderness to abdominal palpation.  I reviewed prior CT from March 2022 that shows no left-sided stones  We discussed possible etiologies at length including chronic scrotal pain, diverticulitis, nerve irritation, constipation.  -I recommended a trial of Celebrex 200 mg twice daily x2 weeks, as well as Flomax for his urinary dribbling -Return precautions discussed extensively -If persistent left lower abdominal pain recommend CT via PCP for further evaluation  Billey Co, MD  [facility] [address] [place], [place] [address] [phone]

## 2021-11-09 ENCOUNTER — Other Ambulatory Visit: Payer: Self-pay | Admitting: Family Medicine

## 2021-11-09 DIAGNOSIS — F3342 Major depressive disorder, recurrent, in full remission: Secondary | ICD-10-CM

## 2021-11-30 ENCOUNTER — Other Ambulatory Visit: Payer: Self-pay | Admitting: Family Medicine

## 2021-11-30 DIAGNOSIS — F3342 Major depressive disorder, recurrent, in full remission: Secondary | ICD-10-CM

## 2021-12-15 ENCOUNTER — Ambulatory Visit (INDEPENDENT_AMBULATORY_CARE_PROVIDER_SITE_OTHER): Payer: Medicare Other | Admitting: Family Medicine

## 2021-12-15 ENCOUNTER — Encounter: Payer: Self-pay | Admitting: Family Medicine

## 2021-12-15 VITALS — BP 124/85 | HR 84 | Temp 98.8°F | Wt 198.0 lb

## 2021-12-15 DIAGNOSIS — E119 Type 2 diabetes mellitus without complications: Secondary | ICD-10-CM

## 2021-12-15 DIAGNOSIS — N401 Enlarged prostate with lower urinary tract symptoms: Secondary | ICD-10-CM

## 2021-12-15 DIAGNOSIS — N3281 Overactive bladder: Secondary | ICD-10-CM | POA: Diagnosis not present

## 2021-12-15 DIAGNOSIS — R7303 Prediabetes: Secondary | ICD-10-CM

## 2021-12-15 DIAGNOSIS — I10 Essential (primary) hypertension: Secondary | ICD-10-CM

## 2021-12-15 DIAGNOSIS — G4733 Obstructive sleep apnea (adult) (pediatric): Secondary | ICD-10-CM

## 2021-12-15 DIAGNOSIS — Z6832 Body mass index (BMI) 32.0-32.9, adult: Secondary | ICD-10-CM

## 2021-12-15 DIAGNOSIS — E6609 Other obesity due to excess calories: Secondary | ICD-10-CM | POA: Diagnosis not present

## 2021-12-15 DIAGNOSIS — Z9989 Dependence on other enabling machines and devices: Secondary | ICD-10-CM

## 2021-12-15 LAB — POCT GLYCOSYLATED HEMOGLOBIN (HGB A1C): Hemoglobin A1C: 6.4 % — AB (ref 4.0–5.6)

## 2021-12-15 NOTE — Progress Notes (Signed)
Argentina Ponder [place],acting as a scribe for Wilhemena Durie, MD.,have documented all relevant documentation on the behalf of Wilhemena Durie, MD,as directed by  Wilhemena Durie, MD while in the presence of Wilhemena Durie, MD.    Established patient visit   Patient: Michael Leon   DOB: May 01, 1951   71 y.o. Male  MRN: [id number] Visit Date: 12/15/2021  Today's healthcare provider: Wilhemena Durie, MD   No chief complaint on file.  Subjective    HPI  Patient comes in today for follow-up.  He is taking his medication is and has no complaints other than when he bends over or stoops and stands back up he gets a little lightheaded for short while. Prediabetes, Follow-up  Lab Results  Component Value Date   HGBA1C 6.0 (A) 07/15/2021   HGBA1C 7.4 (H) 02/25/2021   HGBA1C 7.2 (H) 02/17/2021   GLUCOSE 115 (H) 02/27/2021   GLUCOSE 138 (H) 02/26/2021   GLUCOSE 157 (H) 02/25/2021    Last seen for for this5 months ago.  Management since that visit includes none.  Pertinent Labs:    Component Value Date/Time   CHOL 138 02/17/2021 0758   TRIG 105 02/17/2021 0758   CHOLHDL 3.3 02/17/2021 0758   CHOLHDL 5.0 CALC 05/03/2007 1022   CREATININE 1.12 02/27/2021 0521   CREATININE 1.17 03/29/2012 0146    Wt Readings from Last 3 Encounters:  12/15/21 198 lb (89.8 kg)  11/03/21 195 lb (88.5 kg)  07/15/21 181 lb (82.1 kg)    -----------------------------------------------------------------------------------------   Medications: Outpatient Medications Prior to Visit  Medication Sig   aspirin EC 81 MG tablet Take 81 mg by mouth in the morning. Swallow whole.   atorvastatin (LIPITOR) 80 MG tablet Take 80 mg by mouth in the morning.   celecoxib (CELEBREX) 200 MG capsule Take 1 capsule (200 mg total) by mouth 2 (two) times daily.   losartan (COZAAR) 50 MG tablet Take 1 tablet (50 mg total) by mouth daily.   pantoprazole (PROTONIX) 40 MG tablet Take 1 tablet (40 mg  total) by mouth 2 (two) times daily.   PARoxetine (PAXIL) 40 MG tablet TAKE 1 TABLET(40 MG) BY MOUTH DAILY   tamsulosin (FLOMAX) 0.4 MG CAPS capsule Take 1 capsule (0.4 mg total) by mouth daily.   No facility-administered medications prior to visit.    Review of Systems      Objective    BP 124/85 (BP Location: Left Arm, Patient Position: Sitting, Cuff Size: Normal)    Pulse 84    Temp 98.8 F (37.1 C) (Oral)    Wt 198 lb (89.8 kg)    SpO2 96%    BMI 32.95 kg/m  BP Readings from Last 3 Encounters:  12/15/21 124/85  11/03/21 (!) 149/80  07/15/21 130/78   Wt Readings from Last 3 Encounters:  12/15/21 198 lb (89.8 kg)  11/03/21 195 lb (88.5 kg)  07/15/21 181 lb (82.1 kg)      Physical Exam Vitals reviewed.  Constitutional:      Appearance: Normal appearance.  HENT:     Head: Normocephalic and atraumatic.     Right Ear: Tympanic membrane and external ear normal.     Left Ear: Tympanic membrane and external ear normal.     Mouth/Throat:     Mouth: Mucous membranes are moist.     Pharynx: Oropharynx is clear.  Eyes:     General: No scleral icterus.    Conjunctiva/sclera: Conjunctivae normal.  Neck:  Vascular: No carotid bruit.  Cardiovascular:     Rate and Rhythm: Normal rate and regular rhythm.     Pulses: Normal pulses.     Heart sounds: Normal heart sounds.  Pulmonary:     Effort: Pulmonary effort is normal.  Abdominal:     Palpations: Abdomen is soft.  Genitourinary:    Penis: Normal.      Testes: Normal.  Musculoskeletal:     Right lower leg: No edema.     Left lower leg: No edema.  Lymphadenopathy:     Cervical: No cervical adenopathy.  Skin:    General: Skin is warm and dry.  Neurological:     General: No focal deficit present.     Mental Status: He is alert and oriented to person, place, and time.  Psychiatric:        Mood and Affect: Mood normal.        Behavior: Behavior normal.        Thought Content: Thought content normal.        Judgment:  Judgment normal.      No results found for any visits on 12/15/21.  Assessment & Plan     1. Diabetes mellitus without complication ([place]) Controlled with A1c of 6.4. She wishes not to take medications at this time does work on diet and exercise.  Metformin or Actos or Ozempic or Farxiga in the future  2. Prediabetes  - POCT glycosylated hemoglobin (Hb A1C)  3. OAB (overactive bladder)   4. Benign prostatic hyperplasia with lower urinary tract symptoms, symptom details unspecified   5. Benign essential HTN Due to some mild symptoms suggesting hypotension at times we will cut his losartan 50 to 25 mg daily  6. OSA on CPAP PAP nightly  7. Class 1 obesity due to excess calories with serious comorbidity and body mass index (BMI) of 32.0 to 32.9 in adult Diet and exercise stressed   No follow-ups on file.      I, Wilhemena Durie, MD, have reviewed all documentation for this visit. The documentation on 12/18/21 for the exam, diagnosis, procedures, and orders are all accurate and complete.    Maronda Caison Cranford Mon, MD  [facility] [phone] (phone) [phone] (fax)  [place]

## 2021-12-15 NOTE — Patient Instructions (Signed)
Cut losartan 50mg  to 1/2 pill daily.

## 2021-12-28 ENCOUNTER — Ambulatory Visit: Payer: Self-pay

## 2021-12-28 DIAGNOSIS — M533 Sacrococcygeal disorders, not elsewhere classified: Secondary | ICD-10-CM | POA: Diagnosis not present

## 2021-12-28 NOTE — Progress Notes (Deleted)
?  ? ? ?Established patient visit ? ? ?Patient: Michael Leon   DOB: 1951-04-17   71 y.o. Male  MRN: [id number] ?Visit Date: 12/29/2021 ? ?Today's healthcare provider: Mardene Speak, PA-C  ? ?No chief complaint on file. ? ?Subjective  ?  ?HPI  ?*** ? ?ACK PAIN ?Duration: {Blank single:19197::"days","weeks", "months"} ?Mechanism of injury: {Blank single:19197::"lifting","MVA", "no trauma", "unknown"} ?Location: {Blank multiple:19196::"Right", "Left", "R>L", "L>R", "midline", "bilateral", "low back", "upper back"} ?Onset: {Blank single:19197::"sudden","gradual"} ?Severity: {Blank single:19197::"mild","moderate", "severe", "1/10", "2/10", "3/10", "4/10", "5/10", "6/10", "7/10", "8/10", "9/10", "10/10"} ?Quality: {Blank multiple:19196::"sharp", "dull", "aching", "burning", "cramping", "ill-defined", "itchy", "pressure-like", "pulling", "shooting", "sore", "stabbing", "tender", "tearing", "throbbing"} ?Frequency: {Blank single:19197::"constant","intermittent", "occasional", "rare", "every few minutes", "a few times a hour", "a few times a day", "a few times a week", "a few times a month", "a few times a year"} ?Radiation: {Blank multiple:19196::"none", "buttocks", "R leg below the knee", "R leg above the knee", "L leg below the knee", "L leg above the knee"} ?Aggravating factors: {Blank multiple:19196::"none", "lifting", "movement", "walking", "laying", "bending", "prolonged sitting", "coughing", "valsalva", "Pain increased with coughing/valsalva"} ?Alleviating factors: {Blank multiple:19196::"nothing", "rest", "ice", "heat", "laying", "NSAIDs", "APAP", "narcotics", "muscle relaxer"} ?Status: {Blank multiple:19196::"better", "worse", "stable", "fluctuating"} ?Treatments attempted: {Blank multiple:19196::"none", "rest", "ice", "heat", "APAP", "ibuprofen", "aleve", "physical therapy", "HEP", "OMM"}  ?Relief with NSAIDs?: {Blank single:19197::"No NSAIDs Taken","no", "mild", "moderate", "significant"} ?Nighttime pain:   {Blank single:19197::"yes","no"} ?Paresthesias / decreased sensation:  {Blank single:19197::"yes","no"} ?Bowel / bladder incontinence:  {Blank single:19197::"yes","no"} ?Fevers:  {Blank single:19197::"yes","no"} ?Dysuria / urinary frequency:  {Blank single:19197::"yes","no"} ?Medications: ?Outpatient Medications Prior to Visit  ?Medication Sig  ? aspirin EC 81 MG tablet Take 81 mg by mouth in the morning. Swallow whole.  ? atorvastatin (LIPITOR) 80 MG tablet Take 80 mg by mouth in the morning.  ? celecoxib (CELEBREX) 200 MG capsule Take 1 capsule (200 mg total) by mouth 2 (two) times daily.  ? losartan (COZAAR) 50 MG tablet Take 1 tablet (50 mg total) by mouth daily.  ? pantoprazole (PROTONIX) 40 MG tablet Take 1 tablet (40 mg total) by mouth 2 (two) times daily.  ? PARoxetine (PAXIL) 40 MG tablet TAKE 1 TABLET(40 MG) BY MOUTH DAILY  ? tamsulosin (FLOMAX) 0.4 MG CAPS capsule Take 1 capsule (0.4 mg total) by mouth daily.  ? ?No facility-administered medications prior to visit.  ? ? ?Review of Systems ? ?  Objective  ?  ?There were no vitals taken for this visit. ?{Show previous vital signs (optional):23777} ? ?Physical Exam  ?*** ? ?Back Exam: ?   Inspection: {Blank BVQXIH:03888::" Normal spinal curvature.  No deformity, ecchymosis, erythema, or lesions","Abnormal Inspection"}  ? Curvature: {Blank single:19197::"Normal","Scoliotic", "Kyphosis", "Kyphoscoliosis"}  ? Deformity: {Blank single:19197::"yes","no"} ? Ecchymosis: {Blank single:19197::"yes","no"} {Blank single:19197::"none", "mild","moderate", "significant"} ? Erythema:  {Blank single:19197::"yes","no"} {Blank single:19197::"none", "mild","moderate", "significant"} ? Lesions: {Blank single:19197::"yes","no"} ?   Palpation: ?    Midline spinal tenderness: {Blank single:19197::"yes","no"} {Blank single:19197::"none","cervical", "thoracic", "lumbar", "sacral", "coccygeal"}    Paralumbar tenderness: {Blank single:19197::"yes","no"} {Blank  single:19197::"Right","Left", "R>L", "L>R", "bilateral"} ?    Parathoracic tenderness: {Blank single:19197::"yes","no"} {Blank single:19197::"Right","Left", "R>L", "L>R", "bilateral"} ?    Buttocks tenderness: {Blank single:19197::"yes","no" }{Blank single:19197::"Right","Left", "R>L", "L>R", "bilateral"} ?    Range of Motion:  ?    Flexion: {Blank single:19197::"Normal","Fingers to Knees", "Fingers to Mid[place]", "Fingers to Ankle", "Fingers to Ground"} ?    Extension:{Blank single:19197::"Normal","Decreased"} ?    Lateral bending:{Blank single:19197::"Normal","Decreased"} ?   Rotation:{Blank single:19197::"Normal","Decreased"} ?   Neuro Exam:{Blank single:19197::"Lower extremity DTRs normal & symmetric.  Strength and sensation intact.","Abnormal"} ?  Patellar DTRs: {Blank multiple:19196::"Normal", "Symmetric", "1/4", "2/4", "hyperreflexive", "R absent", "R diminished", "L absent", "L diminished"} ?    Ankle dorsiflexion strength:{Blank single:19197::"Within Normal Limits","5/5", "4/5", "3/5", "2/5", "1/5"} ?    Sensation(medial malleolus):{Blank single:19197::"Within Normal Limits","5/5", "4/5", "3/5", "2/5", "1/5"} ?    Great toe dorsiflexion strength: {Blank single:19197::"Within Normal Limits","5/5", "4/5", "3/5", "2/5", "1/5"} ?    Sensation (mid dorsal foot):{Blank single:19197::"Within Normal Limits","5/5", "4/5", "3/5", "2/5", "1/5"} ?    Ankle DTRs: {Blank multiple:19196::"Normal", "Symmetric", "1/4", "2/4", "hyperreflexive", "R absent", "R diminished", "L absent", "L diminished"} ?    Ankle plantar flexion strength:{Blank single:19197::"Within Normal Limits","5/5", "4/5", "3/5", "2/5", "1/5"} ?    Sensation (lateral heel):{Blank single:19197::"Within Normal Limits","Diminished"} ?   Special Tests:  ?    Straight leg raise:{Blank single:19197::"positive", "negative"} ?    Crossed straight leg raise:{Blank single:19197::"positive", "negative"} ?    Stork test: {Blank single:19197::"positive",  "negative"} ? ? Assessment & Plan  ?  ? ?*** ? ?No follow-ups on file.  ?   ?FU with Dr. Rosanna Randy next week. ?The patient was advised to call back or seek an in-person evaluation if the symptoms worsen or if the condition fails to improve as anticipated. ? ?I discussed the assessment and treatment plan with the patient. The patient was provided an opportunity to ask questions and all were answered. The patient agreed with the plan and demonstrated an understanding of the instructions. ? ?The entirety of the information documented in the History of Present Illness, Review of Systems and Physical Exam were personally obtained by me. Portions of this information were initially documented by the CMA and reviewed by me for thoroughness and accuracy.   ? ? ?Mardene Speak, PA-C  ?[place] ?[phone] (phone) ?[phone] (fax) ? ?[facility]  ?

## 2021-12-28 NOTE — Telephone Encounter (Signed)
Summary: sciatica pain/can't stand for long/wants work in  ? Pt has left butt cheek pain (like sciatica) and would like Dr Rosanna Randy to work him in today.  He said he is good friends w/ Jiles Garter and knows Dr Rosanna Randy, pt c't stand on it for long, he has to sit   ?  ? ?Chief Complaint: Low back pain that radiates into left buttock ?Symptoms: Pain is worse when walking. ?Frequency: Started Saturday  ?Pertinent Negatives: Patient denies  ?Disposition: '[]'$ ED /'[]'$ Urgent Care (no appt availability in office) / '[]'$ Appointment(In office/virtual)/ '[]'$  [place] [facility]/ '[]'$ Home Care/ '[]'$ Refused Recommended Disposition /'[]'$ [place] [facility]/ '[]'$  Follow-up with PCP ?Additional Notes: Requesting to be worked in with Dr. Rosanna Randy today. Does not want to see anyone else. Please advise.  ?Answer Assessment - Initial Assessment Questions ?1. ONSET: "When did the pain begin?"  ?    Saturday ?2. LOCATION: "Where does it hurt?" (upper, mid or lower back) ?    Low back and left buttock ?3. SEVERITY: "How bad is the pain?"  (e.g., Scale 1-10; mild, moderate, or severe) ?  - MILD (1-3): doesn't interfere with normal activities  ?  - MODERATE (4-7): interferes with normal activities or awakens from sleep  ?  - SEVERE (8-10): excruciating pain, unable to do any normal activities  ?    Severe ?4. PATTERN: "Is the pain constant?" (e.g., yes, no; constant, intermittent)  ?    Comes and goes ?5. RADIATION: "Does the pain shoot into your legs or elsewhere?" ?    Left buttock ?6. CAUSE:  "What do you think is causing the back pain?"  ?    Unsure ?7. BACK OVERUSE:  "Any recent lifting of heavy objects, strenuous work or exercise?" ?    No ?8. MEDICATIONS: "What have you taken so far for the pain?" (e.g., nothing, acetaminophen, NSAIDS) ?    Yes ?9. NEUROLOGIC SYMPTOMS: "Do you have any weakness, numbness, or problems with bowel/bladder control?" ?    Left leg weak ?10. OTHER SYMPTOMS: "Do you have any other symptoms?" (e.g., fever, abdominal pain,  burning with urination, blood in urine) ?      No ?11. PREGNANCY: "Is there any chance you are pregnant?" (e.g., yes, no; LMP) ?      N/a ? ?Protocols used: Back Pain-A-AH ? ?

## 2021-12-28 NOTE — Telephone Encounter (Signed)
Patient advised, scheduled appt with patient to be evaluated by Letitia Libra in AM. KW ?

## 2021-12-28 NOTE — Telephone Encounter (Signed)
I have no openings this week. I can see him next week.

## 2021-12-29 ENCOUNTER — Ambulatory Visit: Payer: Medicare Other | Admitting: Physician Assistant

## 2022-01-20 ENCOUNTER — Ambulatory Visit (INDEPENDENT_AMBULATORY_CARE_PROVIDER_SITE_OTHER): Payer: Medicare Other | Admitting: Urology

## 2022-01-20 ENCOUNTER — Other Ambulatory Visit: Payer: Self-pay | Admitting: *Deleted

## 2022-01-20 ENCOUNTER — Encounter: Payer: Self-pay | Admitting: Urology

## 2022-01-20 ENCOUNTER — Other Ambulatory Visit
Admission: RE | Admit: 2022-01-20 | Discharge: 2022-01-20 | Disposition: A | Discharge status: Home / Self Care | Payer: Medicare Other | Attending: Urology | Admitting: Urology

## 2022-01-20 VITALS — BP 171/85 | HR 78 | Ht 65.0 in | Wt 198.0 lb

## 2022-01-20 DIAGNOSIS — N200 Calculus of kidney: Secondary | ICD-10-CM | POA: Diagnosis not present

## 2022-01-20 DIAGNOSIS — R102 Pelvic and perineal pain unspecified side: Secondary | ICD-10-CM

## 2022-01-20 DIAGNOSIS — Z87448 Personal history of other diseases of urinary system: Secondary | ICD-10-CM

## 2022-01-20 DIAGNOSIS — R310 Gross hematuria: Secondary | ICD-10-CM

## 2022-01-20 LAB — URINALYSIS, COMPLETE (UACMP) WITH MICROSCOPIC
Bacteria, UA: NONE SEEN
Bilirubin Urine: NEGATIVE
Glucose, UA: NEGATIVE mg/dL
Ketones, ur: NEGATIVE mg/dL
Leukocytes,Ua: NEGATIVE
Nitrite: NEGATIVE
Protein, ur: 100 mg/dL — AB
Specific Gravity, Urine: 1.025 (ref 1.005–1.030)
WBC, UA: NONE SEEN WBC/hpf (ref 0–5)
pH: 5 (ref 5.0–8.0)

## 2022-01-20 NOTE — Progress Notes (Signed)
? ?  01/20/2022 ?9:46 AM  ? ?Michael Leon ?17-Jun-1951 ?[id number] ? ?Reason for visit: Gross hematuria, history of nephrolithiasis and scrotal pain ? ?HPI: ?71 year old male who originally presented with a right proximal ureteral stone in March 2022 and opted for shockwave lithotripsy.  He had some persistent small fragments that migrated distally and ultimately underwent right ureteroscopy in May 2022.  Cystoscopy was normal at that time.  This was complicated by febrile UTI when he did not take the antibiotic prior to stent removal. ? ?I last saw him in January 2023 for left-sided scrotal pain, urinalysis and exam were benign, and his symptoms resolved with Celebrex. ? ?He was added to clinic schedule today for gross hematuria.  He reports blood in the urine this week when voiding without clots that was painless.  He saw a little bit of blood the next morning, but has resolved since that time.  He has had some mild lower abdominal discomfort but nothing similar to prior stone episodes or flank pain. ? ?Urinalysis today with 11-20 RBCs but otherwise benign. ? ?We discussed common possible etiologies of hematuria including BPH, malignancy, urolithiasis, medical renal disease, and idiopathic. Standard workup recommended by the AUA includes imaging with CT urogram to assess the upper tracts, and cystoscopy. Cytology is performed on patient's with gross hematuria to look for malignant cells in the urine. ? ?Using shared decision making, he opted to start with CT urogram and defer cystoscopy with history of normal cystoscopy within the last year.  We will call with CT results ? ? ?Billey Co, MD ? ?[facility] ?[address], [address] ?[place], [place] [address] ?[phone] ? ? ?

## 2022-02-03 ENCOUNTER — Other Ambulatory Visit: Payer: Self-pay

## 2022-02-03 ENCOUNTER — Ambulatory Visit
Admission: RE | Admit: 2022-02-03 | Discharge: 2022-02-03 | Disposition: A | Discharge status: Home / Self Care | Payer: Medicare Other | Source: Ambulatory Visit | Attending: Urology | Admitting: Urology

## 2022-02-03 DIAGNOSIS — N200 Calculus of kidney: Secondary | ICD-10-CM | POA: Diagnosis not present

## 2022-02-03 DIAGNOSIS — R310 Gross hematuria: Secondary | ICD-10-CM | POA: Diagnosis not present

## 2022-02-03 DIAGNOSIS — N3289 Other specified disorders of bladder: Secondary | ICD-10-CM | POA: Diagnosis not present

## 2022-02-03 LAB — POCT I-STAT CREATININE: Creatinine, Ser: 1.2 mg/dL (ref 0.61–1.24)

## 2022-02-03 MED ORDER — IOHEXOL 300 MG/ML  SOLN
125.0000 mL | Freq: Once | INTRAMUSCULAR | Status: AC | PRN
  Administered 2022-02-03: 125 mL via INTRAVENOUS

## 2022-02-24 ENCOUNTER — Telehealth: Payer: Self-pay

## 2022-02-24 NOTE — Telephone Encounter (Signed)
Incoming call on triage line from pt stating he is experiencing blood in urine again. He denies any other symptoms. Offered pt appt with PA, he declined stating he would only like to see Dr Diamantina Providence. Per [place], add pt tomorrow. Pt added, pt confirmed.  ?

## 2022-02-25 ENCOUNTER — Encounter: Payer: Self-pay | Admitting: Urology

## 2022-02-25 ENCOUNTER — Ambulatory Visit (INDEPENDENT_AMBULATORY_CARE_PROVIDER_SITE_OTHER): Payer: Medicare Other | Admitting: Urology

## 2022-02-25 VITALS — BP 167/82 | HR 60 | Ht 65.0 in | Wt 198.0 lb

## 2022-02-25 DIAGNOSIS — N138 Other obstructive and reflux uropathy: Secondary | ICD-10-CM

## 2022-02-25 DIAGNOSIS — R310 Gross hematuria: Secondary | ICD-10-CM | POA: Diagnosis not present

## 2022-02-25 LAB — MICROSCOPIC EXAMINATION

## 2022-02-25 LAB — URINALYSIS, COMPLETE
Bilirubin, UA: NEGATIVE
Glucose, UA: NEGATIVE
Ketones, UA: NEGATIVE
Leukocytes,UA: NEGATIVE
Nitrite, UA: NEGATIVE
Specific Gravity, UA: 1.025 (ref 1.005–1.030)
Urobilinogen, Ur: 1 mg/dL (ref 0.2–1.0)
pH, UA: 6 (ref 5.0–7.5)

## 2022-02-25 NOTE — Patient Instructions (Signed)

## 2022-02-25 NOTE — Progress Notes (Signed)
? ?  02/25/2022 ?9:53 AM  ? ?Michael Leon ?09/07/1951 ?[id number] ? ?Reason for visit: Gross hematuria, history of nephrolithiasis, urinary symptoms ? ?HPI: ?71 year old male who originally presented with a right proximal ureteral stone in March 2022 and opted for shockwave lithotripsy.  He had some persistent small fragments that migrated distally and ultimately underwent right ureteroscopy in May 2022.  Cystoscopy was normal at that time.  This was complicated by febrile UTI when he did not take the antibiotic prior to stent removal. ? ?I saw him in January 2023 for some left-sided scrotal pain and UA and exam were benign, and symptoms resolved with Celebrex.  He was also started on Flomax at that time for some postvoid dribbling, which he feels has improved on that medication. ? ?I most recently saw him in April 2023 when he developed painless gross hematuria, and UA showed microscopic hematuria with 11-20 RBCs.  Using shared decision making he opted for a CT urogram but to defer cystoscopy with his normal cystoscopy in the OR within the last year.  CT urogram showed no significant abnormalities aside from some possible small stone debris in the bladder.  I personally viewed and interpreted those images again today.  PSA was normal in May 2022 at 1.8 and stable from 2.2 the year prior. ? ?He was added to the clinic schedule today for recurrence of painless gross hematuria and passing some sediment in the urine.  Urinalysis today again shows 11-30 RBCs but is otherwise benign. ? ?We again reviewed possible etiologies including BPH, malignancy, urolithiasis, and idiopathic.  At this point with his recurrent gross hematuria I recommended cystoscopy for further evaluation, and he was in agreement.  Risk and benefits were discussed.  If cystoscopy and cytology are benign, could consider replacing Flomax with finasteride if etiology of hematuria felt to be prostatic origin. ? ?-Cystoscopy and cytology for further  evaluation of gross hematuria ?-Consider replacing Flomax with finasteride if work-up negative and bleeding felt to be BPH in origin ? ?Billey Co, MD ? ?[facility] ?[address], [address] ?[place], [place] [address] ?[phone] ? ? ?

## 2022-03-31 ENCOUNTER — Encounter: Payer: Self-pay | Admitting: Urology

## 2022-03-31 ENCOUNTER — Ambulatory Visit (INDEPENDENT_AMBULATORY_CARE_PROVIDER_SITE_OTHER): Payer: Medicare Other | Admitting: Urology

## 2022-03-31 VITALS — BP 156/75 | HR 76 | Ht 65.0 in | Wt 198.0 lb

## 2022-03-31 DIAGNOSIS — N401 Enlarged prostate with lower urinary tract symptoms: Secondary | ICD-10-CM | POA: Diagnosis not present

## 2022-03-31 DIAGNOSIS — R310 Gross hematuria: Secondary | ICD-10-CM | POA: Diagnosis not present

## 2022-03-31 DIAGNOSIS — N138 Other obstructive and reflux uropathy: Secondary | ICD-10-CM | POA: Diagnosis not present

## 2022-03-31 MED ORDER — SULFAMETHOXAZOLE-TRIMETHOPRIM 800-160 MG PO TABS
1.0000 | ORAL_TABLET | Freq: Once | ORAL | Status: AC
  Administered 2022-03-31: 1 via ORAL

## 2022-03-31 MED ORDER — FINASTERIDE 5 MG PO TABS: 5.0000 mg | ORAL_TABLET | Freq: Every day | ORAL | 3 refills | Status: DC

## 2022-03-31 NOTE — Progress Notes (Signed)
Cystoscopy Procedure Note:  Indication: Gross hematuria  Bactrim given for prophylaxis  After informed consent and discussion of the procedure and its risks, HAVEN FOSS was positioned and prepped in the standard fashion. Cystoscopy was performed with a flexible cystoscope. The urethra, bladder neck and entire bladder was visualized in a standard fashion. The prostate was large with obstructing lateral lobes.  Minimal stone dust near the base of the bladder, ~77m very small fragments.  The ureteral orifices were visualized in their normal location and orientation.  Mild bladder trabeculations, no suspicious lesions.  No abnormalities on retroflexion.  Imaging: CT urogram 02/03/2022 benign  Findings: Normal cystoscopy, suspect BPH as etiology of intermittent gross hematuria  Assessment and Plan: -We will start finasteride for recurrent gross hematuria -RTC 673-monthymptom check -Consider HOLEP in the future if worsening urinary symptoms or recurrent gross hematuria from suspected BPH  BrNickolas MadridMD 03/31/2022

## 2022-04-01 DIAGNOSIS — H35352 Cystoid macular degeneration, left eye: Secondary | ICD-10-CM | POA: Diagnosis not present

## 2022-04-01 DIAGNOSIS — H2511 Age-related nuclear cataract, right eye: Secondary | ICD-10-CM | POA: Diagnosis not present

## 2022-04-01 DIAGNOSIS — H5203 Hypermetropia, bilateral: Secondary | ICD-10-CM | POA: Diagnosis not present

## 2022-05-05 ENCOUNTER — Ambulatory Visit (INDEPENDENT_AMBULATORY_CARE_PROVIDER_SITE_OTHER): Payer: Medicare Other | Admitting: Family Medicine

## 2022-05-05 ENCOUNTER — Encounter: Payer: Self-pay | Admitting: Family Medicine

## 2022-05-05 VITALS — BP 115/74 | HR 69 | Resp 16 | Wt 203.0 lb

## 2022-05-05 DIAGNOSIS — N3281 Overactive bladder: Secondary | ICD-10-CM | POA: Diagnosis not present

## 2022-05-05 DIAGNOSIS — E119 Type 2 diabetes mellitus without complications: Secondary | ICD-10-CM | POA: Diagnosis not present

## 2022-05-05 DIAGNOSIS — N401 Enlarged prostate with lower urinary tract symptoms: Secondary | ICD-10-CM | POA: Diagnosis not present

## 2022-05-05 DIAGNOSIS — Z9989 Dependence on other enabling machines and devices: Secondary | ICD-10-CM

## 2022-05-05 DIAGNOSIS — Z6836 Body mass index (BMI) 36.0-36.9, adult: Secondary | ICD-10-CM | POA: Diagnosis not present

## 2022-05-05 DIAGNOSIS — G4733 Obstructive sleep apnea (adult) (pediatric): Secondary | ICD-10-CM

## 2022-05-05 LAB — POCT GLYCOSYLATED HEMOGLOBIN (HGB A1C)
Est. average glucose Bld gHb Est-mCnc: 123
Hemoglobin A1C: 5.9 % — AB (ref 4.0–5.6)

## 2022-05-05 NOTE — Progress Notes (Signed)
psa     Established patient visit  I,April Miller,acting as a scribe for Wilhemena Durie, MD.,have documented all relevant documentation on the behalf of Wilhemena Durie, MD,as directed by  Wilhemena Durie, MD while in the presence of Wilhemena Durie, MD.   Patient: Michael Leon   DOB: 16-May-1951   71 y.o. Male  MRN: [id number] Visit Date: 05/05/2022  Today's healthcare provider: Wilhemena Durie, MD   Chief Complaint  Patient presents with   Follow-up   Diabetes   Subjective    HPI  Patient for follow-up.  He is feeling pretty well except for urinary tract symptoms with obstructive type problems.  He sees urology.  Diabetes Mellitus Type II, follow-up  Lab Results  Component Value Date   HGBA1C 5.9 (A) 05/05/2022   HGBA1C 6.4 (A) 12/15/2021   HGBA1C 6.0 (A) 07/15/2021   Last seen for diabetes 5 months ago.  Management since then includes; he wishes not to take medications at this time does work on diet and exercise.  Metformin or Actos or Ozempic or Farxiga in the future  Home blood sugar records: fasting range: not checking Most Recent Eye Exam: sent fax requesting most recent results.   --------------------------------------------------------------------------------------------------- Hypertension, follow-up  BP Readings from Last 3 Encounters:  05/05/22 115/74  03/31/22 (!) 156/75  02/25/22 (!) 167/82   Wt Readings from Last 3 Encounters:  05/05/22 203 lb (92.1 kg)  03/31/22 198 lb (89.8 kg)  02/25/22 198 lb (89.8 kg)     He was last seen for hypertension 5 months ago.  Management since that visit includes; taking losartan 50 mg. Outside blood pressures are not checking.  --------------------------------------------------------------------------------------------------   Medications: Outpatient Medications Prior to Visit  Medication Sig   aspirin EC 81 MG tablet Take 81 mg by mouth in the morning. Swallow whole.   atorvastatin  (LIPITOR) 80 MG tablet Take 80 mg by mouth in the morning.   finasteride (PROSCAR) 5 MG tablet Take 1 tablet (5 mg total) by mouth daily.   losartan (COZAAR) 50 MG tablet Take 1 tablet (50 mg total) by mouth daily.   pantoprazole (PROTONIX) 40 MG tablet Take 1 tablet (40 mg total) by mouth 2 (two) times daily.   PARoxetine (PAXIL) 40 MG tablet TAKE 1 TABLET(40 MG) BY MOUTH DAILY   tamsulosin (FLOMAX) 0.4 MG CAPS capsule Take 1 capsule (0.4 mg total) by mouth daily.   No facility-administered medications prior to visit.    Review of Systems  Constitutional:  Negative for appetite change, chills and fever.  Respiratory:  Negative for chest tightness, shortness of breath and wheezing.   Cardiovascular:  Negative for chest pain and palpitations.  Gastrointestinal:  Negative for abdominal pain, nausea and vomiting.    Last hemoglobin A1c Lab Results  Component Value Date   HGBA1C 5.9 (A) 05/05/2022       Objective    BP 115/74 (BP Location: Left Arm, Patient Position: Sitting, Cuff Size: Normal)   Pulse 69   Resp 16   Wt 203 lb (92.1 kg)   SpO2 98%   BMI 33.78 kg/m  BP Readings from Last 3 Encounters:  05/05/22 115/74  03/31/22 (!) 156/75  02/25/22 (!) 167/82   Wt Readings from Last 3 Encounters:  05/05/22 203 lb (92.1 kg)  03/31/22 198 lb (89.8 kg)  02/25/22 198 lb (89.8 kg)      Physical Exam Vitals reviewed.  Constitutional:      General: He is not  in acute distress.    Appearance: He is well-developed.  HENT:     Head: Normocephalic and atraumatic.     Right Ear: Hearing normal.     Left Ear: Hearing normal.     Nose: Nose normal.  Eyes:     General: Lids are normal. No scleral icterus.       Right eye: No discharge.        Left eye: No discharge.     Conjunctiva/sclera: Conjunctivae normal.  Cardiovascular:     Rate and Rhythm: Normal rate and regular rhythm.     Heart sounds: Normal heart sounds.  Pulmonary:     Effort: Pulmonary effort is normal. No  respiratory distress.  Skin:    Findings: No lesion or rash.  Neurological:     General: No focal deficit present.     Mental Status: He is alert and oriented to person, place, and time.  Psychiatric:        Mood and Affect: Mood normal.        Speech: Speech normal.        Behavior: Behavior normal.        Thought Content: Thought content normal.        Judgment: Judgment normal.       Results for orders placed or performed in visit on 05/05/22  POCT glycosylated hemoglobin (Hb A1C)  Result Value Ref Range   Hemoglobin A1C 5.9 (A) 4.0 - 5.6 %   Est. average glucose Bld gHb Est-mCnc 123     Assessment & Plan     1. Diabetes mellitus without complication ([place]) Goal P3I less than 7 he and he is improved from 6.4-5.9.  No changes - POCT glycosylated hemoglobin (Hb A1C)  2. Benign prostatic hyperplasia with lower urinary tract symptoms, symptom details unspecified Obtain PSA and follow-up with urology. - PSA  3. OAB (overactive bladder) Urology  4. OSA on CPAP Use CPAP nightly  5. Class 2 severe obesity due to excess calories with serious comorbidity and body mass index (BMI) of 36.0 to 36.9 in adult [facility]) Diet and exercise stressed   No follow-ups on file.      I, Wilhemena Durie, MD, have reviewed all documentation for this visit. The documentation on 05/08/22 for the exam, diagnosis, procedures, and orders are all accurate and complete.    Adalea Handler Cranford Mon, MD  [facility] [phone] (phone) [phone] (fax)  [place]

## 2022-05-06 ENCOUNTER — Telehealth: Payer: Self-pay

## 2022-05-06 LAB — PSA: Prostate Specific Ag, Serum: 1 ng/mL (ref 0.0–4.0)

## 2022-05-06 NOTE — Telephone Encounter (Signed)
Patient was advised of result.

## 2022-05-06 NOTE — Telephone Encounter (Signed)
Copied from [id number]. Topic: General - Other >> May 06, 2022 10:32 AM Michael Leon wrote: Reason for CRM: The patient would like to speak with a member of clinical staff about their PSA results   Please contact further when possible

## 2022-05-11 ENCOUNTER — Ambulatory Visit: Payer: Medicare Other | Admitting: Family Medicine

## 2022-05-18 ENCOUNTER — Ambulatory Visit (INDEPENDENT_AMBULATORY_CARE_PROVIDER_SITE_OTHER): Payer: Medicare Other

## 2022-05-18 VITALS — Wt 203.0 lb

## 2022-05-18 DIAGNOSIS — I2510 Atherosclerotic heart disease of native coronary artery without angina pectoris: Secondary | ICD-10-CM | POA: Insufficient documentation

## 2022-05-18 DIAGNOSIS — Z0000 Encounter for general adult medical examination without abnormal findings: Secondary | ICD-10-CM | POA: Diagnosis not present

## 2022-05-18 NOTE — Patient Instructions (Signed)
Michael Leon , Thank you for taking time to come for your Medicare Wellness Visit. I appreciate your ongoing commitment to your health goals. Please review the following plan we discussed and let me know if I can assist you in the future.   Screening recommendations/referrals: Colonoscopy: 12/31/19 Recommended yearly ophthalmology/optometry visit for glaucoma screening and checkup Recommended yearly dental visit for hygiene and checkup  Vaccinations: Influenza vaccine: 07/15/21 Pneumococcal vaccine: 07/15/21 Tdap vaccine: 03/14/13 Shingles vaccine: Zostavax 07/17/15   Covid-19: 12/04/19, 01/01/20, 07/01/20  Advanced directives: no  Conditions/risks identified: none  Next appointment: Follow up in one year for your annual wellness visit. 05/23/23 @ 2:15 pm by phone  Preventive Care 70 Years and Older, Male Preventive care refers to lifestyle choices and visits with your health care provider that can promote health and wellness. What does preventive care include? A yearly physical exam. This is also called an annual well check. Dental exams once or twice a year. Routine eye exams. Ask your health care provider how often you should have your eyes checked. Personal lifestyle choices, including: Daily care of your teeth and gums. Regular physical activity. Eating a healthy diet. Avoiding tobacco and drug use. Limiting alcohol use. Practicing safe sex. Taking low doses of aspirin every day. Taking vitamin and mineral supplements as recommended by your health care provider. What happens during an annual well check? The services and screenings done by your health care provider during your annual well check will depend on your age, overall health, lifestyle risk factors, and family history of disease. Counseling  Your health care provider may ask you questions about your: Alcohol use. Tobacco use. Drug use. Emotional well-being. Home and relationship well-being. Sexual activity. Eating  habits. History of falls. Memory and ability to understand (cognition). Work and work Statistician. Screening  You may have the following tests or measurements: Height, weight, and BMI. Blood pressure. Lipid and cholesterol levels. These may be checked every 5 years, or more frequently if you are over 70 years old. Skin check. Lung cancer screening. You may have this screening every year starting at age 70 if you have a 30-pack-year history of smoking and currently smoke or have quit within the past 15 years. Fecal occult blood test (FOBT) of the stool. You may have this test every year starting at age 40. Flexible sigmoidoscopy or colonoscopy. You may have a sigmoidoscopy every 5 years or a colonoscopy every 10 years starting at age 70. Prostate cancer screening. Recommendations will vary depending on your family history and other risks. Hepatitis C blood test. Hepatitis B blood test. Sexually transmitted disease (STD) testing. Diabetes screening. This is done by checking your blood sugar (glucose) after you have not eaten for a while (fasting). You may have this done every 1-3 years. Abdominal aortic aneurysm (AAA) screening. You may need this if you are a current or former smoker. Osteoporosis. You may be screened starting at age 70 if you are at high risk. Talk with your health care provider about your test results, treatment options, and if necessary, the need for more tests. Vaccines  Your health care provider may recommend certain vaccines, such as: Influenza vaccine. This is recommended every year. Tetanus, diphtheria, and acellular pertussis (Tdap, Td) vaccine. You may need a Td booster every 10 years. Zoster vaccine. You may need this after age 70. Pneumococcal 13-valent conjugate (PCV13) vaccine. One dose is recommended after age 70. Pneumococcal polysaccharide (PPSV23) vaccine. One dose is recommended after age 70. Talk to your health  care provider about which screenings and  vaccines you need and how often you need them. This information is not intended to replace advice given to you by your health care provider. Make sure you discuss any questions you have with your health care provider. Document Released: 10/31/2015 Document Revised: 06/23/2016 Document Reviewed: 08/05/2015 Elsevier Interactive Patient Education  2017 [employer] Prevention in the Home Falls can cause injuries. They can happen to people of all ages. There are many things you can do to make your home safe and to help prevent falls. What can I do on the outside of my home? Regularly fix the edges of walkways and driveways and fix any cracks. Remove anything that might make you trip as you walk through a door, such as a raised step or threshold. Trim any bushes or trees on the path to your home. Use bright outdoor lighting. Clear any walking paths of anything that might make someone trip, such as rocks or tools. Regularly check to see if handrails are loose or broken. Make sure that both sides of any steps have handrails. Any raised decks and porches should have guardrails on the edges. Have any leaves, snow, or ice cleared regularly. Use sand or salt on walking paths during winter. Clean up any spills in your garage right away. This includes oil or grease spills. What can I do in the bathroom? Use night lights. Install grab bars by the toilet and in the tub and shower. Do not use towel bars as grab bars. Use non-skid mats or decals in the tub or shower. If you need to sit down in the shower, use a plastic, non-slip stool. Keep the floor dry. Clean up any water that spills on the floor as soon as it happens. Remove soap buildup in the tub or shower regularly. Attach bath mats securely with double-sided non-slip rug tape. Do not have throw rugs and other things on the floor that can make you trip. What can I do in the bedroom? Use night lights. Make sure that you have a light by your  bed that is easy to reach. Do not use any sheets or blankets that are too big for your bed. They should not hang down onto the floor. Have a firm chair that has side arms. You can use this for support while you get dressed. Do not have throw rugs and other things on the floor that can make you trip. What can I do in the kitchen? Clean up any spills right away. Avoid walking on wet floors. Keep items that you use a lot in easy-to-reach places. If you need to reach something above you, use a strong step stool that has a grab bar. Keep electrical cords out of the way. Do not use floor polish or wax that makes floors slippery. If you must use wax, use non-skid floor wax. Do not have throw rugs and other things on the floor that can make you trip. What can I do with my stairs? Do not leave any items on the stairs. Make sure that there are handrails on both sides of the stairs and use them. Fix handrails that are broken or loose. Make sure that handrails are as long as the stairways. Check any carpeting to make sure that it is firmly attached to the stairs. Fix any carpet that is loose or worn. Avoid having throw rugs at the top or bottom of the stairs. If you do have throw rugs, attach them to  the floor with carpet tape. Make sure that you have a light switch at the top of the stairs and the bottom of the stairs. If you do not have them, ask someone to add them for you. What else can I do to help prevent falls? Wear shoes that: Do not have high heels. Have rubber bottoms. Are comfortable and fit you well. Are closed at the toe. Do not wear sandals. If you use a stepladder: Make sure that it is fully opened. Do not climb a closed stepladder. Make sure that both sides of the stepladder are locked into place. Ask someone to hold it for you, if possible. Clearly mark and make sure that you can see: Any grab bars or handrails. First and last steps. Where the edge of each step is. Use tools that  help you move around (mobility aids) if they are needed. These include: Canes. Walkers. Scooters. Crutches. Turn on the lights when you go into a dark area. Replace any light bulbs as soon as they burn out. Set up your furniture so you have a clear path. Avoid moving your furniture around. If any of your floors are uneven, fix them. If there are any pets around you, be aware of where they are. Review your medicines with your doctor. Some medicines can make you feel dizzy. This can increase your chance of falling. Ask your doctor what other things that you can do to help prevent falls. This information is not intended to replace advice given to you by your health care provider. Make sure you discuss any questions you have with your health care provider. Document Released: 07/31/2009 Document Revised: 03/11/2016 Document Reviewed: 11/08/2014 Elsevier Interactive Patient Education  2017 [employer].

## 2022-05-18 NOTE — Progress Notes (Signed)
Virtual Visit via Telephone Note  I connected with  Michael Leon on 05/18/22 at  3:30 PM EDT by telephone and verified that I am speaking with the correct person using two identifiers.  Location: Patient: home Provider: [facility] Persons participating in the virtual visit: [name]   I discussed the limitations, risks, security and privacy concerns of performing an evaluation and management service by telephone and the availability of in person appointments. The patient expressed understanding and agreed to proceed.  Interactive audio and video telecommunications were attempted between this nurse and patient, however failed, due to patient having technical difficulties OR patient did not have access to video capability.  We continued and completed visit with audio only.  Some vital signs may be absent or patient reported.   Michael David, LPN  Subjective:   Michael Leon is a 71 y.o. male who presents for Medicare Annual/Subsequent preventive examination.  Review of Systems     Cardiac Risk Factors include: advanced age (>68mn, >>74women);dyslipidemia;hypertension;male gender     Objective:    There were no vitals filed for this visit. There is no height or weight on file to calculate BMI.     05/18/2022    3:34 PM 06/21/2021    3:45 PM 02/25/2021    6:16 PM 02/25/2021    3:05 PM 02/20/2021    8:04 AM 01/22/2021    7:12 AM 10/23/2020    6:26 AM  Advanced Directives  Does Patient Have a Medical Advance Directive? No No  No No No No  Would patient like information on creating a medical advance directive? No - Patient declined  No - Patient declined  No - Patient declined No - Patient declined No - Patient declined    Current Medications (verified) Outpatient Encounter Medications as of 05/18/2022  Medication Sig   aspirin EC 81 MG tablet Take 81 mg by mouth in the morning. Swallow whole.   atorvastatin (LIPITOR) 80 MG tablet Take 80 mg by mouth in the morning.    finasteride (PROSCAR) 5 MG tablet Take 1 tablet (5 mg total) by mouth daily.   losartan (COZAAR) 50 MG tablet Take 1 tablet (50 mg total) by mouth daily.   pantoprazole (PROTONIX) 40 MG tablet Take 1 tablet (40 mg total) by mouth 2 (two) times daily.   PARoxetine (PAXIL) 40 MG tablet TAKE 1 TABLET(40 MG) BY MOUTH DAILY   tamsulosin (FLOMAX) 0.4 MG CAPS capsule Take 1 capsule (0.4 mg total) by mouth daily.   No facility-administered encounter medications on file as of 05/18/2022.    Allergies (verified) Hydrocodone and Codeine   History: Past Medical History:  Diagnosis Date   Arthritis    ASCVD (arteriosclerotic cardiovascular disease)    Cervical radiculopathy    Chest pain, unspecified    Coronary artery disease    Depression    Dysplastic nevus    left ear - treated by Dr. PSharlett Iles  GAD (generalized anxiety disorder)    GAD (generalized anxiety disorder)    GERD (gastroesophageal reflux disease)    Gout    HLD (hyperlipidemia)    Hypertension    Kidney stone    Obesity    Pre-diabetes    borderline. patient unaware of this,   Reflux    Sleep apnea    CAN NOT USE HIS CPAP   STEMI ([place] elevation [place] infarction) [place]   2 STENTS   Vertigo    none recently   Past Surgical  History:  Procedure Laterality Date   ANTERIOR CRUCIATE LIGAMENT REPAIR Right 1993, 2005   1993   APPENDECTOMY     CARDIAC CATHETERIZATION  2016   stent   CHOLECYSTECTOMY     COLONOSCOPY WITH PROPOFOL N/A 12/31/2019   Procedure: COLONOSCOPY WITH PROPOFOL;  Surgeon: [name], MD;  Location: [facility];  Service: Endoscopy;  Laterality: N/A;   CORONARY STENT PLACEMENT     CYSTOSCOPY/URETEROSCOPY/HOLMIUM LASER/STENT PLACEMENT Right 02/20/2021   Procedure: CYSTOSCOPY/URETEROSCOPY/HOLMIUM LASER/STENT PLACEMENT;  Surgeon: Billey Co, MD;  Location: [facility];  Service: Urology;  Laterality: Right;   EXTRACORPOREAL SHOCK WAVE LITHOTRIPSY Right 01/22/2021   Procedure:  EXTRACORPOREAL SHOCK WAVE LITHOTRIPSY (ESWL);  Surgeon: Hollice Espy, MD;  Location: [facility];  Service: Urology;  Laterality: Right;   EYE SURGERY Left    SHOULDER ARTHROSCOPY WITH ROTATOR CUFF REPAIR AND SUBACROMIAL DECOMPRESSION Right 10/23/2020   Procedure: RIGHT SHOULDER ARTHROSCOPY WITH DEB RIDEMENT;  Surgeon: Thornton Park, MD;  Location: [facility];  Service: Orthopedics;  Laterality: Right;   TONSILLECTOMY     UPPER GI ENDOSCOPY     Family History  Problem Relation Age of Onset   Cancer Mother        lung   Stroke Mother    Multiple sclerosis Mother    Heart attack Father    Bladder Cancer Maternal Grandfather    Heart disease Maternal Grandfather    Prostate cancer Neg Hx    Kidney disease Neg Hx    Social History   Socioeconomic History   Marital status: Single    Spouse name: Not on file   Number of children: 1   Years of education: Not on file   Highest education level: Some college, no degree  Occupational History   Occupation: Retired Agricultural consultant  Tobacco Use   Smoking status: Former    Packs/day: 1.50    Years: 15.00    Total pack years: 22.50    Types: Cigarettes    Quit date: 10/18/2002    Years since quitting: 19.5    Passive exposure: Past   Smokeless tobacco: Current    Types: Chew   Tobacco comments:    Dips  Vaping Use   Vaping Use: Never used  Substance and Sexual Activity   Alcohol use: Yes    Alcohol/week: 7.0 - 14.0 standard drinks of alcohol    Types: 7 - 14 Cans of beer per week    Comment: 1-2 "lite" beers per day   Drug use: No   Sexual activity: Yes  Other Topics Concern   Not on file  Social History Narrative   Gets regular exercise - walking.    Patient lives alone. His girlfriend, Michael Leon lives nearby.   His son comes around to help and may stay with him after surgery.      Social Determinants of Health   Financial Resource Strain: Low Risk  (05/18/2022)   Overall Financial Resource Strain (CARDIA)    Difficulty of Paying  Living Expenses: Not hard at all  Food Insecurity: No Food Insecurity (05/18/2022)   Hunger Vital Sign    Worried About Running Out of Food in the Last Year: Never true    Ran Out of Food in the Last Year: Never true  Transportation Needs: No Transportation Needs (05/18/2022)   PRAPARE - Hydrologist (Medical): No    Lack of Transportation (Non-Medical): No  Physical Activity: Sufficiently Active (05/18/2022)   Exercise Vital Sign  Days of Exercise per Week: 5 days    Minutes of Exercise per Session: 60 min  Stress: No Stress Concern Present (05/18/2022)   [place]    Feeling of Stress : Not at all  Social Connections: Socially Isolated (05/18/2022)   Social Connection and Isolation Panel [NHANES]    Frequency of Communication with Friends and Family: More than three times a week    Frequency of Social Gatherings with Friends and Family: Three times a week    Attends Religious Services: Never    Active Member of Clubs or Organizations: No    Attends Music therapist: Never    Marital Status: Divorced    Tobacco Counseling Ready to quit: Not Answered Counseling given: Not Answered Tobacco comments: Dips   Clinical Intake:  Pre-visit preparation completed: Yes  Pain : No/denies pain     Diabetes: No  How often do you need to have someone help you when you read instructions, pamphlets, or other written materials from your doctor or pharmacy?: 1 - Never  Diabetic?no  Interpreter Needed?: No  Information entered by :: Kirke Shaggy, LPN   Activities of Daily Living    05/18/2022    3:35 PM  In your present state of health, do you have any difficulty performing the following activities:  Hearing? 0  Vision? 0  Difficulty concentrating or making decisions? 0  Walking or climbing stairs? 0  Dressing or bathing? 0  Doing errands, shopping? 0  Preparing Food and  eating ? N  Using the Toilet? N  In the past six months, have you accidently leaked urine? N  Do you have problems with loss of bowel control? N  Managing your Medications? N  Managing your Finances? N  Housekeeping or managing your Housekeeping? N    Patient Care Team: Jerrol Banana., MD as PCP - General (Family Medicine) Corey Skains, MD as Consulting Physician (Cardiology) [name], MD as Consulting Physician (Gastroenterology) Anell Barr, OD (Optometry)  Indicate any recent Medical Services you may have received from other than [facility] providers in the past year (date may be approximate).     Assessment:   This is a routine wellness examination for [facility].  Hearing/Vision screen Hearing Screening - Comments:: No aids Vision Screening - Comments:: Wears glasses- Dr.Woodard   Dietary issues and exercise activities discussed: Current Exercise Habits: Home exercise routine, Type of exercise: walking, Time (Minutes): 60, Frequency (Times/Week): 5, Weekly Exercise (Minutes/Week): 300   Goals Addressed             This Visit's Progress    DIET - EAT MORE FRUITS AND VEGETABLES         Depression Screen    05/18/2022    3:33 PM 02/11/2021    1:46 PM 11/05/2020   10:23 AM 01/22/2020   11:26 AM 11/29/2017    9:59 AM 10/31/2017    8:26 AM 10/31/2017    8:25 AM  PHQ 2/9 Scores  PHQ - 2 Score 0 2 1 0 0 0 1  PHQ- 9 Score 0 '5 2   1     '$ Fall Risk    05/18/2022    3:34 PM 02/11/2021    1:45 PM 11/05/2020   10:22 AM 01/22/2020   11:31 AM 05/17/2019    6:18 PM  Fall Risk   Falls in the past year? '1 1 1 '$ 0   Comment     Emmi  Telephone Survey: data to providers prior to load  Number falls in past yr: 0 0 0 0   Comment     Emmi Telephone Survey Actual Response =   Injury with Fall? '1 1 1 '$ 0   Risk for fall due to : History of fall(s) History of fall(s) History of fall(s);Impaired balance/gait    Follow up Falls evaluation completed;Falls prevention discussed Falls  evaluation completed;Education provided;Falls prevention discussed Falls evaluation completed;Education provided;Falls prevention discussed      FALL RISK PREVENTION PERTAINING TO THE HOME:  Any stairs in or around the home? No  If so, are there any without handrails? No  Home free of loose throw rugs in walkways, pet beds, electrical cords, etc? Yes  Adequate lighting in your home to reduce risk of falls? Yes   ASSISTIVE DEVICES UTILIZED TO PREVENT FALLS:  Life alert? No  Use of a cane, walker or w/c? No  Grab bars in the bathroom? No  Shower chair or bench in shower? No  Elevated toilet seat or a handicapped toilet? No    Cognitive Function:        05/18/2022    3:37 PM  6CIT Screen  What Year? 0 points  What month? 0 points  What time? 0 points  Count back from 20 0 points  Months in reverse 0 points  Repeat phrase 0 points  Total Score 0 points    Immunizations Immunization History  Administered Date(s) Administered   Fluad Quad(high Dose 65+) 09/06/2019, 07/30/2020, 07/15/2021   Influenza, High Dose Seasonal PF 08/16/2018   Influenza-Unspecified 10/23/2015   Moderna Sars-Covid-2 Vaccination 12/04/2019, 01/01/2020, 07/01/2020   Pneumococcal Conjugate-13 11/29/2017   Pneumococcal Polysaccharide-23 07/15/2021   Td 11/16/1990   Tdap 03/14/2013   Zoster, Live 07/17/2015    TDAP status: Up to date  Flu Vaccine status: Up to date  Pneumococcal vaccine status: Up to date  Covid-19 vaccine status: Completed vaccines  Qualifies for Shingles Vaccine? Yes   Zostavax completed Yes   Shingrix Completed?: No.    Education has been provided regarding the importance of this vaccine. Patient has been advised to call insurance company to determine out of pocket expense if they have not yet received this vaccine. Advised may also receive vaccine at local pharmacy or Health Dept. Verbalized acceptance and understanding.  Screening Tests Health Maintenance  Topic Date Due    FOOT EXAM  Never done   OPHTHALMOLOGY EXAM  Never done   Diabetic kidney evaluation - Urine ACR  Never done   Zoster Vaccines- Shingrix (1 of 2) Never done   COVID-19 Vaccine (4 - Booster for Moderna series) 08/26/2020   Diabetic kidney evaluation - GFR measurement  02/27/2022   INFLUENZA VACCINE  05/18/2022   HEMOGLOBIN A1C  11/05/2022   TETANUS/TDAP  03/15/2023   COLONOSCOPY (Pts 45-77yr Insurance coverage will need to be confirmed)  12/30/2024   Pneumonia Vaccine 70 Years old  Completed   Hepatitis C Screening  Completed   HPV VACCINES  Aged Out    Health Maintenance  Health Maintenance Due  Topic Date Due   FOOT EXAM  Never done   OPHTHALMOLOGY EXAM  Never done   Diabetic kidney evaluation - Urine ACR  Never done   Zoster Vaccines- Shingrix (1 of 2) Never done   COVID-19 Vaccine (4 - Booster for Moderna series) 08/26/2020   Diabetic kidney evaluation - GFR measurement  02/27/2022   INFLUENZA VACCINE  05/18/2022    Colorectal cancer screening: Type  of screening: Colonoscopy. Completed 12/31/19. Repeat every 5 years  Lung Cancer Screening: (Low Dose CT Chest recommended if Age 32-80 years, 30 pack-year currently smoking OR have quit w/in 15years.) does not qualify.   Additional Screening:  Hepatitis C Screening: does qualify; Completed 02/20/13  Vision Screening: Recommended annual ophthalmology exams for early detection of glaucoma and other disorders of the eye. Is the patient up to date with their annual eye exam?  Yes  Who is the provider or what is the name of the office in which the patient attends annual eye exams? Dr.Woodard If pt is not established with a provider, would they like to be referred to a provider to establish care? No .   Dental Screening: Recommended annual dental exams for proper oral hygiene  Community Resource Referral / Chronic Care Management: CRR required this visit?  No   CCM required this visit?  No      Plan:     I have  personally reviewed and noted the following in the patient's chart:   Medical and social history Use of alcohol, tobacco or illicit drugs  Current medications and supplements including opioid prescriptions. Patient is not currently taking opioid prescriptions. Functional ability and status Nutritional status Physical activity Advanced directives List of other physicians Hospitalizations, surgeries, and ER visits in previous 12 months Vitals Screenings to include cognitive, depression, and falls Referrals and appointments  In addition, I have reviewed and discussed with patient certain preventive protocols, quality metrics, and best practice recommendations. A written personalized care plan for preventive services as well as general preventive health recommendations were provided to patient.     Michael David, LPN   04/22/7971   Nurse Notes: none

## 2022-05-30 IMAGING — CR DG ABDOMEN 1V
2 series · 2 of 2 positions shown · non-contrast
Comparison: 01/09/2021

CLINICAL DATA: Hematuria, renal calculi

EXAM:
ABDOMEN - 1 VIEW

[abdomen kub (1 of 2)]
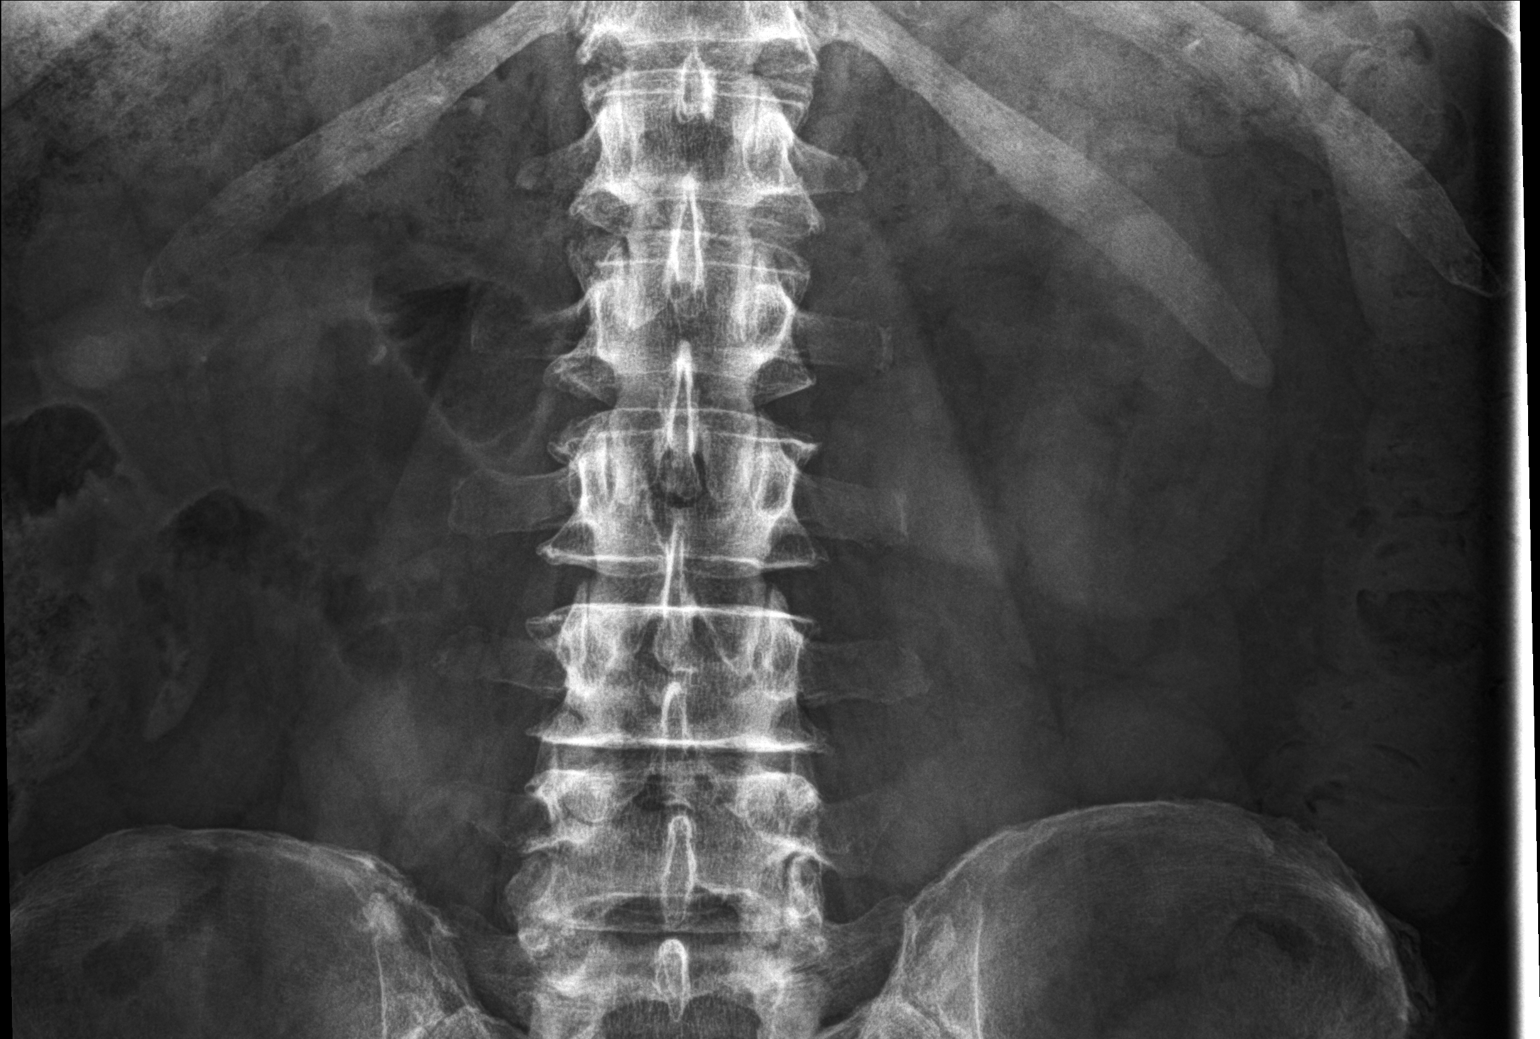

[abdomen kub (2 of 2)]
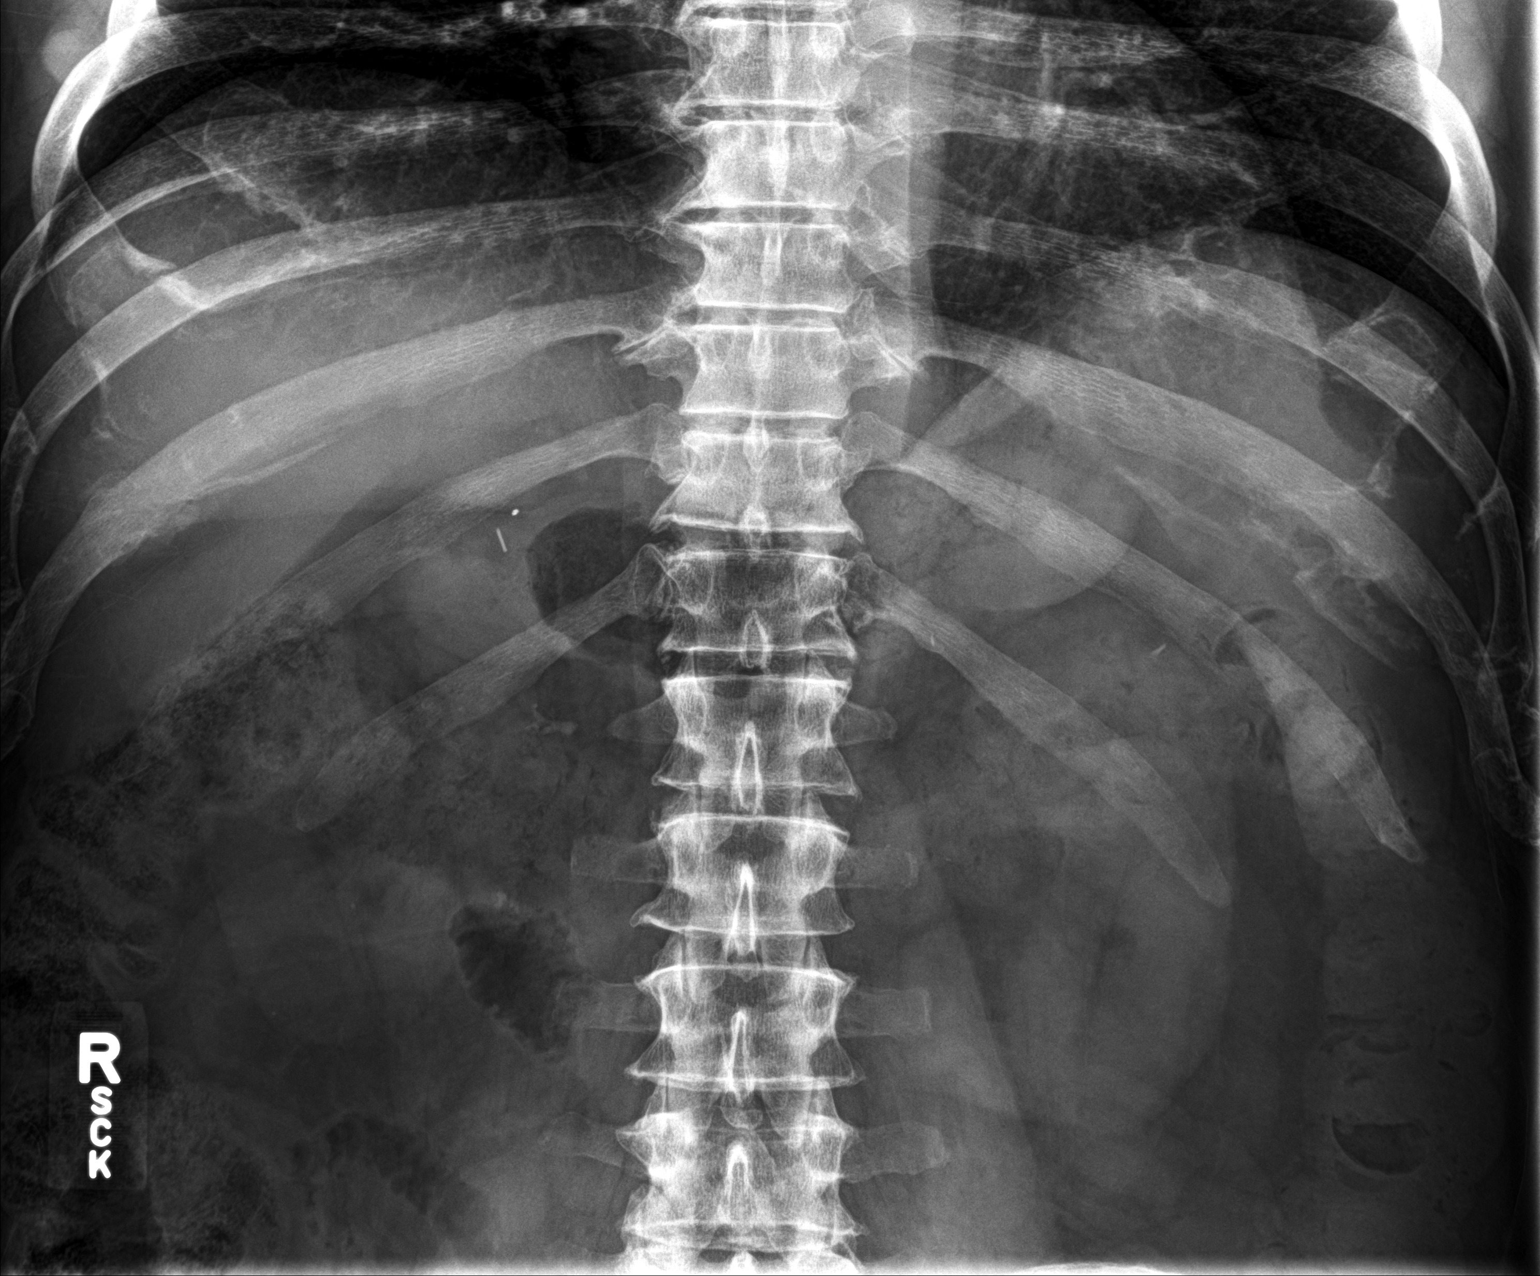

[2 of 2 positions shown; findings below may reference images not displayed]

FINDINGS: Two supine frontal views of the abdomen are obtained. The pelvis is
excluded by collimation. The proximal right renal calculus seen on
recent CT is again identified, measuring approximately 4 mm in the
right paraspinal region at the L2 level. No other urinary tract
calculi. Visualized bowel gas pattern is unremarkable.
IMPRESSION: 1. Stable 4 mm calculus in the region of the right renal pelvis.

## 2022-06-28 DIAGNOSIS — E782 Mixed hyperlipidemia: Secondary | ICD-10-CM | POA: Diagnosis not present

## 2022-07-04 IMAGING — CR DG ABDOMEN 1V
2 series · 2 of 2 positions shown · non-contrast
Comparison: February 03, 2021

CLINICAL DATA: Nephrolithiasis

EXAM:
ABDOMEN - 1 VIEW

[abdomen kub (1 of 2)]
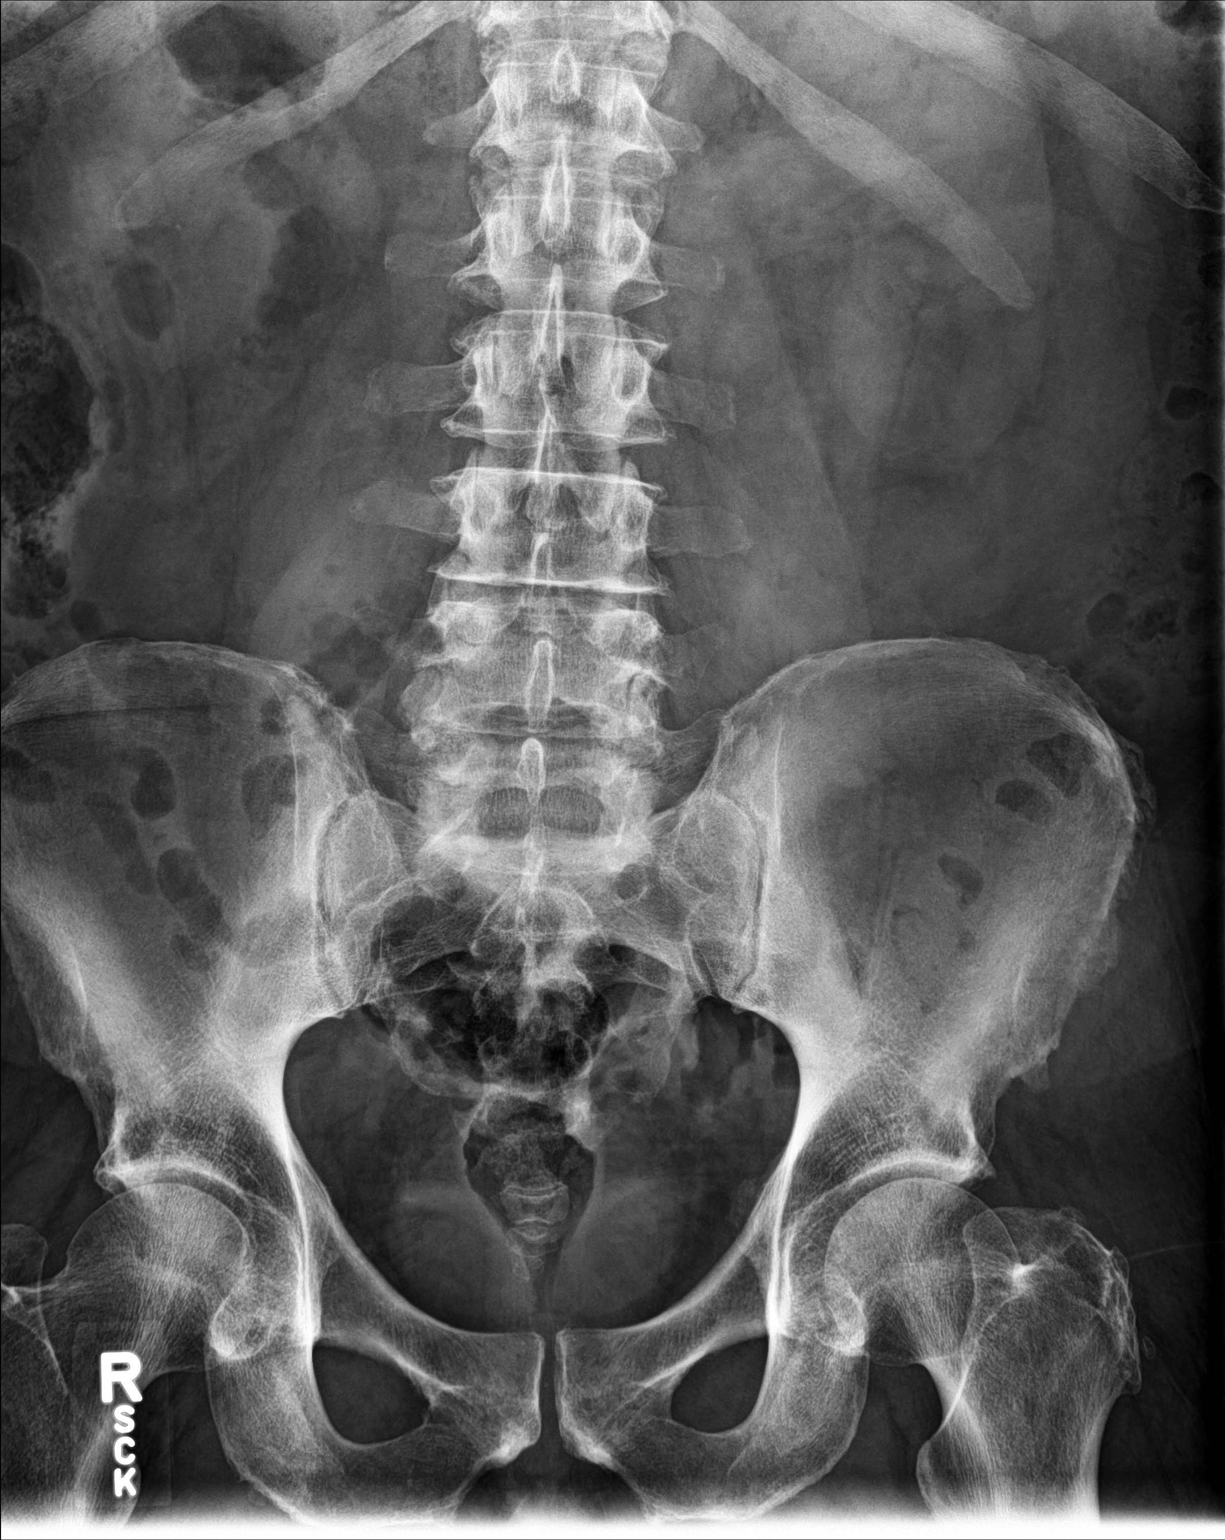

[abdomen kub (2 of 2)]
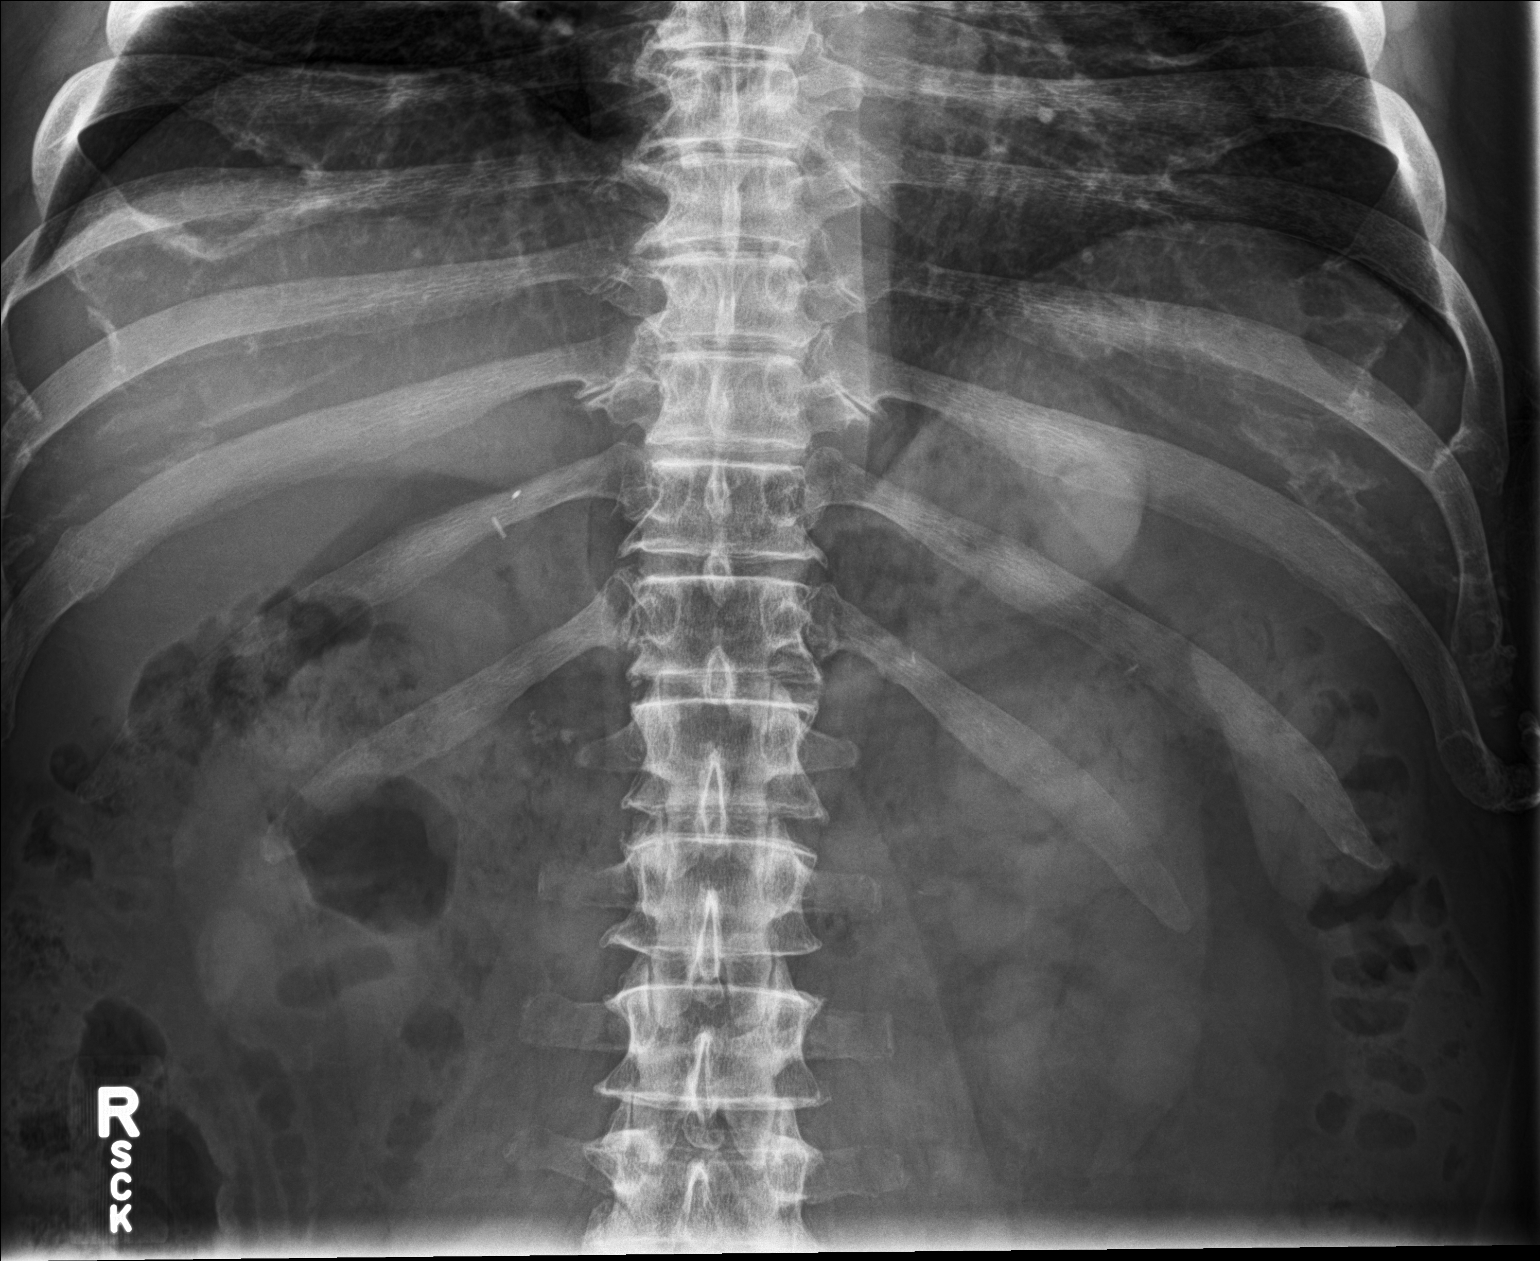

[2 of 2 positions shown; findings below may reference images not displayed]

FINDINGS: Previous apparent calculus on the right at the level of L2 is no
longer appreciable. No renal or ureteral calculus is appreciable on
this study. Calcifications immediately to the right of L[DATE]
represent foci of pancreatic calcification, also present on previous
study. There is moderate stool in the colon. There is no bowel
dilatation or air-fluid level to suggest bowel obstruction. No free
air.
IMPRESSION: Previous apparent calculus on the right at the L2 level no longer
evident. Calcifications more superiorly on the right likely
represent foci of chronic pancreatitis. No bowel obstruction or free
air evident.

## 2022-08-03 ENCOUNTER — Ambulatory Visit: Payer: Medicare Other | Admitting: Urology

## 2022-08-18 DIAGNOSIS — E669 Obesity, unspecified: Secondary | ICD-10-CM | POA: Diagnosis not present

## 2022-08-18 DIAGNOSIS — E782 Mixed hyperlipidemia: Secondary | ICD-10-CM | POA: Diagnosis not present

## 2022-08-18 DIAGNOSIS — I10 Essential (primary) hypertension: Secondary | ICD-10-CM | POA: Diagnosis not present

## 2022-08-18 DIAGNOSIS — I2510 Atherosclerotic heart disease of native coronary artery without angina pectoris: Secondary | ICD-10-CM | POA: Diagnosis not present

## 2022-09-13 ENCOUNTER — Ambulatory Visit: Payer: Medicare Other | Admitting: Family Medicine

## 2022-09-28 ENCOUNTER — Ambulatory Visit: Payer: Medicare Other | Admitting: Urology

## 2022-10-08 ENCOUNTER — Telehealth: Payer: Self-pay | Admitting: Family Medicine

## 2022-10-08 DIAGNOSIS — K219 Gastro-esophageal reflux disease without esophagitis: Secondary | ICD-10-CM

## 2022-10-08 MED ORDER — PANTOPRAZOLE SODIUM 40 MG PO TBEC: 40.0000 mg | DELAYED_RELEASE_TABLET | Freq: Two times a day (BID) | ORAL | 0 refills | Status: AC

## 2022-10-08 NOTE — Telephone Encounter (Signed)
Pharmacy requesting refill of pantoprazole (PROTONIX) 40 MG tablet

## 2022-10-25 ENCOUNTER — Other Ambulatory Visit: Payer: Self-pay

## 2022-10-25 DIAGNOSIS — Z87448 Personal history of other diseases of urinary system: Secondary | ICD-10-CM

## 2022-10-26 ENCOUNTER — Encounter: Payer: Self-pay | Admitting: Urology

## 2022-10-26 ENCOUNTER — Other Ambulatory Visit
Admission: RE | Admit: 2022-10-26 | Discharge: 2022-10-26 | Disposition: A | Discharge status: Home / Self Care | Payer: Medicare Other | Attending: Urology | Admitting: Urology

## 2022-10-26 ENCOUNTER — Ambulatory Visit (INDEPENDENT_AMBULATORY_CARE_PROVIDER_SITE_OTHER): Payer: Medicare Other | Admitting: Urology

## 2022-10-26 VITALS — BP 155/77 | HR 103 | Ht 65.0 in | Wt 204.2 lb

## 2022-10-26 DIAGNOSIS — Z87448 Personal history of other diseases of urinary system: Secondary | ICD-10-CM | POA: Insufficient documentation

## 2022-10-26 DIAGNOSIS — N401 Enlarged prostate with lower urinary tract symptoms: Secondary | ICD-10-CM | POA: Diagnosis not present

## 2022-10-26 DIAGNOSIS — R3121 Asymptomatic microscopic hematuria: Secondary | ICD-10-CM | POA: Diagnosis not present

## 2022-10-26 DIAGNOSIS — N138 Other obstructive and reflux uropathy: Secondary | ICD-10-CM

## 2022-10-26 LAB — URINALYSIS, COMPLETE (UACMP) WITH MICROSCOPIC
Bacteria, UA: NONE SEEN
Bilirubin Urine: NEGATIVE
Glucose, UA: NEGATIVE mg/dL
Ketones, ur: NEGATIVE mg/dL
Leukocytes,Ua: NEGATIVE
Nitrite: NEGATIVE
Specific Gravity, Urine: 1.025 (ref 1.005–1.030)
pH: 5.5 (ref 5.0–8.0)

## 2022-10-26 LAB — BLADDER SCAN AMB NON-IMAGING

## 2022-10-26 MED ORDER — FINASTERIDE 5 MG PO TABS: 5.0000 mg | ORAL_TABLET | Freq: Every day | ORAL | 3 refills | Status: DC

## 2022-10-26 MED ORDER — TAMSULOSIN HCL 0.4 MG PO CAPS: 0.4000 mg | ORAL_CAPSULE | Freq: Every day | ORAL | 11 refills | Status: DC

## 2022-10-26 NOTE — Patient Instructions (Signed)

## 2022-10-26 NOTE — Progress Notes (Signed)
   10/26/2022 11:00 AM   Marlee Trentman Varano 1950/11/14 [id number]  Reason for visit: Follow up BPH, history of hematuria, nephrolithiasis  HPI: 72 year old male with history of nephrolithiasis previously requiring shockwave lithotripsy and ureteroscopy.  He also had an episode of gross hematuria in May 2023 and CT was benign aside from a 100 g prostate, cystoscopy was benign, and he opted for a trial of finasteride.  He denies any episodes of gross hematuria since that time.  He passed a few small stones about 2 months ago but denies any problems since that time.  He has cut back on his animal protein intake, which he feels like correlated with his increased stone formation.  Urinalysis today with 6-10 RBC but otherwise benign, PVR normal at 28m.  Denies any significant urinary symptoms aside from some occasional urgency, drinks a fair amount of soda and alcohol during the day.  We discussed general stone prevention strategies including adequate hydration with goal of producing 2.5 L of urine daily, increasing citric acid intake, increasing calcium intake during high oxalate meals, minimizing animal protein, and decreasing salt intake. Information about dietary recommendations given today.   Continue Flomax and finasteride RTC 1 year UA and PVR   BBilley Co MD  [id number] [place] [phone]

## 2022-11-09 DIAGNOSIS — E782 Mixed hyperlipidemia: Secondary | ICD-10-CM | POA: Diagnosis not present

## 2022-11-09 DIAGNOSIS — I10 Essential (primary) hypertension: Secondary | ICD-10-CM | POA: Diagnosis not present

## 2022-11-09 DIAGNOSIS — R7303 Prediabetes: Secondary | ICD-10-CM | POA: Diagnosis not present

## 2022-11-09 DIAGNOSIS — E119 Type 2 diabetes mellitus without complications: Secondary | ICD-10-CM | POA: Diagnosis not present

## 2022-11-09 DIAGNOSIS — I2510 Atherosclerotic heart disease of native coronary artery without angina pectoris: Secondary | ICD-10-CM | POA: Diagnosis not present

## 2022-11-09 DIAGNOSIS — Z6835 Body mass index (BMI) 35.0-35.9, adult: Secondary | ICD-10-CM | POA: Diagnosis not present

## 2022-12-13 ENCOUNTER — Telehealth: Payer: Self-pay | Admitting: Family Medicine

## 2022-12-13 NOTE — Telephone Encounter (Signed)
[facility] faxed refill request for the following medications:    PARoxetine (PAXIL) 40 MG tablet   Please advise

## 2022-12-13 NOTE — Telephone Encounter (Signed)
Not a patient at [facility]. Patient has established a [facility] in [place].

## 2023-03-21 DIAGNOSIS — Z87891 Personal history of nicotine dependence: Secondary | ICD-10-CM | POA: Diagnosis not present

## 2023-03-21 DIAGNOSIS — I10 Essential (primary) hypertension: Secondary | ICD-10-CM | POA: Diagnosis not present

## 2023-03-21 DIAGNOSIS — R0602 Shortness of breath: Secondary | ICD-10-CM | POA: Diagnosis not present

## 2023-03-21 DIAGNOSIS — F419 Anxiety disorder, unspecified: Secondary | ICD-10-CM | POA: Diagnosis not present

## 2023-03-21 DIAGNOSIS — I213 ST elevation (STEMI) myocardial infarction of unspecified site: Secondary | ICD-10-CM | POA: Diagnosis not present

## 2023-03-21 DIAGNOSIS — K219 Gastro-esophageal reflux disease without esophagitis: Secondary | ICD-10-CM | POA: Diagnosis not present

## 2023-03-21 DIAGNOSIS — I2510 Atherosclerotic heart disease of native coronary artery without angina pectoris: Secondary | ICD-10-CM | POA: Diagnosis not present

## 2023-03-21 DIAGNOSIS — E782 Mixed hyperlipidemia: Secondary | ICD-10-CM | POA: Diagnosis not present

## 2023-03-21 DIAGNOSIS — E669 Obesity, unspecified: Secondary | ICD-10-CM | POA: Diagnosis not present

## 2023-04-07 DIAGNOSIS — I2510 Atherosclerotic heart disease of native coronary artery without angina pectoris: Secondary | ICD-10-CM | POA: Diagnosis not present

## 2023-04-07 DIAGNOSIS — R0602 Shortness of breath: Secondary | ICD-10-CM | POA: Diagnosis not present

## 2023-04-26 DIAGNOSIS — I213 ST elevation (STEMI) myocardial infarction of unspecified site: Secondary | ICD-10-CM | POA: Diagnosis not present

## 2023-04-26 DIAGNOSIS — I2510 Atherosclerotic heart disease of native coronary artery without angina pectoris: Secondary | ICD-10-CM | POA: Diagnosis not present

## 2023-04-26 DIAGNOSIS — I10 Essential (primary) hypertension: Secondary | ICD-10-CM | POA: Diagnosis not present

## 2023-04-26 DIAGNOSIS — K219 Gastro-esophageal reflux disease without esophagitis: Secondary | ICD-10-CM | POA: Diagnosis not present

## 2023-04-26 DIAGNOSIS — F419 Anxiety disorder, unspecified: Secondary | ICD-10-CM | POA: Diagnosis not present

## 2023-04-26 DIAGNOSIS — Z87891 Personal history of nicotine dependence: Secondary | ICD-10-CM | POA: Diagnosis not present

## 2023-04-26 DIAGNOSIS — E669 Obesity, unspecified: Secondary | ICD-10-CM | POA: Diagnosis not present

## 2023-04-26 DIAGNOSIS — R0602 Shortness of breath: Secondary | ICD-10-CM | POA: Diagnosis not present

## 2023-04-26 DIAGNOSIS — E782 Mixed hyperlipidemia: Secondary | ICD-10-CM | POA: Diagnosis not present

## 2023-04-28 ENCOUNTER — Other Ambulatory Visit: Payer: Self-pay | Admitting: Physician Assistant

## 2023-04-28 DIAGNOSIS — K219 Gastro-esophageal reflux disease without esophagitis: Secondary | ICD-10-CM

## 2023-05-13 DIAGNOSIS — E782 Mixed hyperlipidemia: Secondary | ICD-10-CM | POA: Diagnosis not present

## 2023-05-13 DIAGNOSIS — E669 Obesity, unspecified: Secondary | ICD-10-CM | POA: Diagnosis not present

## 2023-05-13 DIAGNOSIS — K219 Gastro-esophageal reflux disease without esophagitis: Secondary | ICD-10-CM | POA: Diagnosis not present

## 2023-05-13 DIAGNOSIS — F419 Anxiety disorder, unspecified: Secondary | ICD-10-CM | POA: Diagnosis not present

## 2023-05-13 DIAGNOSIS — I2510 Atherosclerotic heart disease of native coronary artery without angina pectoris: Secondary | ICD-10-CM | POA: Diagnosis not present

## 2023-05-13 DIAGNOSIS — E119 Type 2 diabetes mellitus without complications: Secondary | ICD-10-CM | POA: Diagnosis not present

## 2023-05-16 DIAGNOSIS — H35352 Cystoid macular degeneration, left eye: Secondary | ICD-10-CM | POA: Diagnosis not present

## 2023-05-16 DIAGNOSIS — H2511 Age-related nuclear cataract, right eye: Secondary | ICD-10-CM | POA: Diagnosis not present

## 2023-05-16 DIAGNOSIS — H5203 Hypermetropia, bilateral: Secondary | ICD-10-CM | POA: Diagnosis not present

## 2023-05-16 DIAGNOSIS — H1045 Other chronic allergic conjunctivitis: Secondary | ICD-10-CM | POA: Diagnosis not present

## 2023-06-20 IMAGING — CT CT ABD-PEL WO/W CM
2 of 12 series · 9 of 46 positions shown, 15 images · IV contrast (agent unspecified)
Comparison: CT January 09, 2021.
COMPARISON: CT January 09, 2021.

Addendum:
CLINICAL DATA: Gross hematuria

EXAM:
CT ABDOMEN AND PELVIS WITHOUT AND WITH CONTRAST
TECHNIQUE: Multidetector CT imaging of the abdomen and pelvis was performed
following the standard protocol before and following the bolus
administration of intravenous contrast.

[Series 2: abd without pre 5.00 · axial · non-contrast · 0.76mm/px · z∈[-1488,-1128]mm · 7 of 97 slices shown, 12 images]
[im 13/97  soft-tissue]
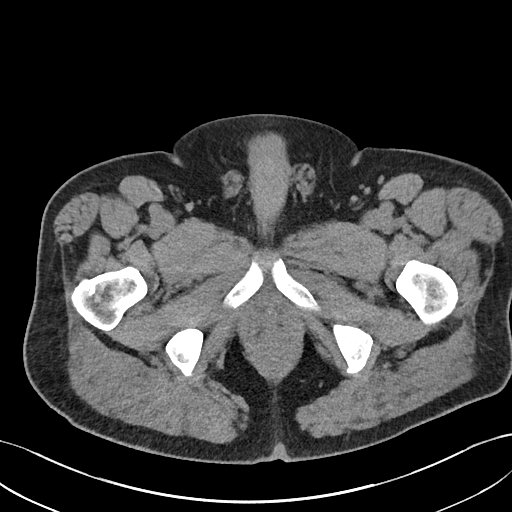
[im 13/97  bone]
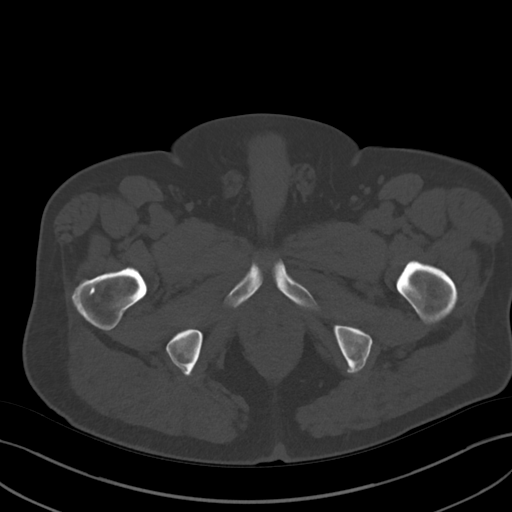
[im 25/97  soft-tissue]
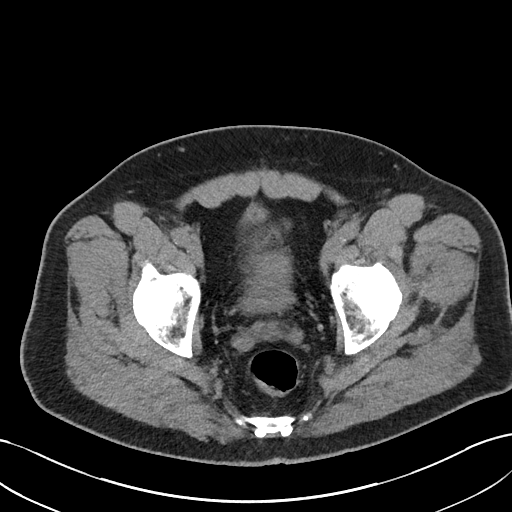
[im 37/97  soft-tissue]
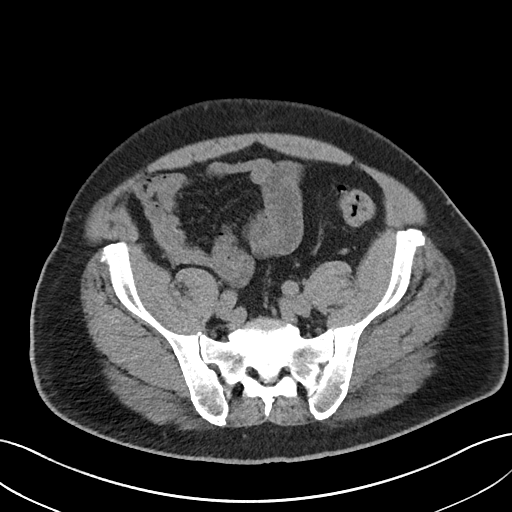
[im 49/97  soft-tissue]
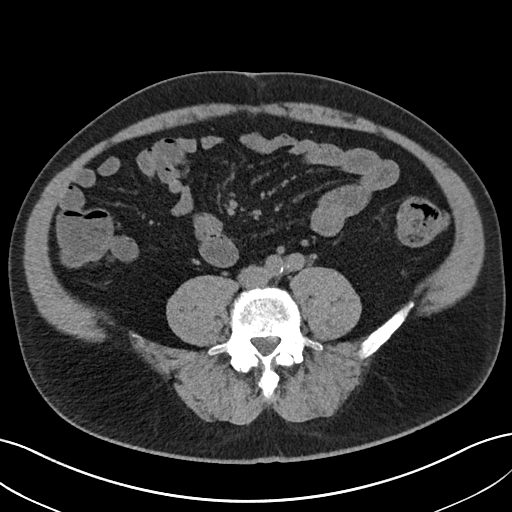
[im 49/97  lung]
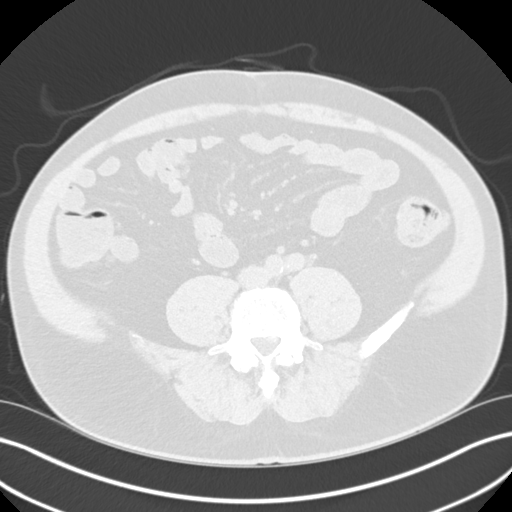
[im 61/97  soft-tissue]
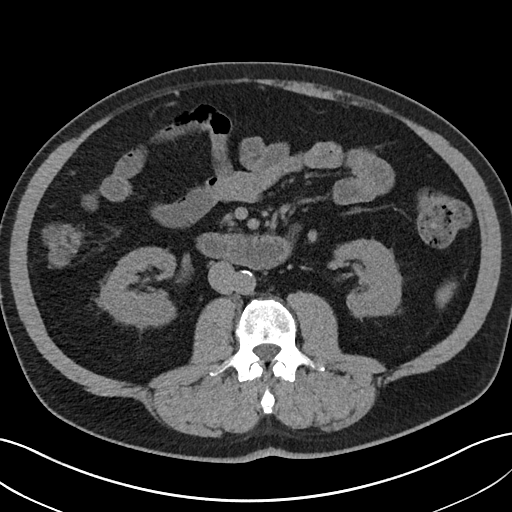
[im 61/97  lung]
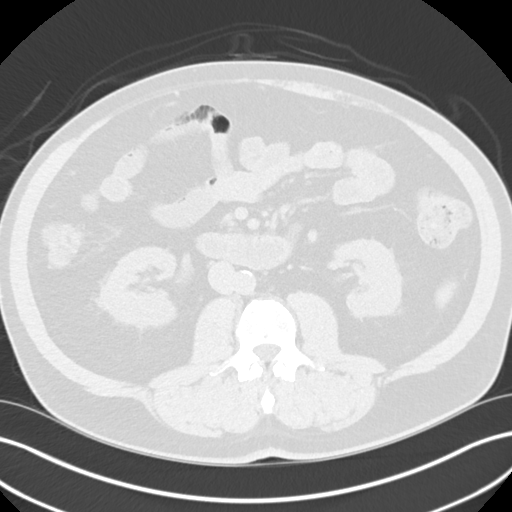
[im 73/97  soft-tissue]
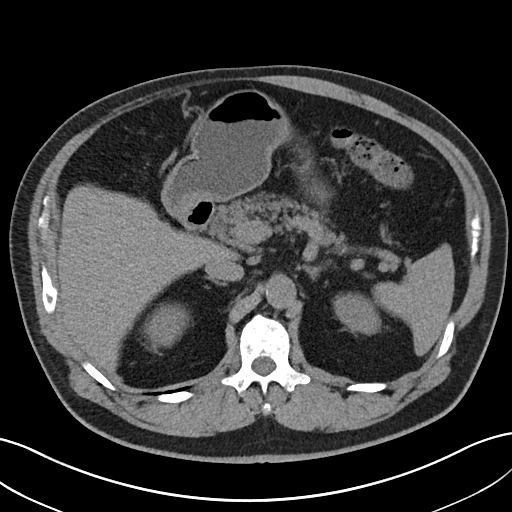
[im 73/97  lung]
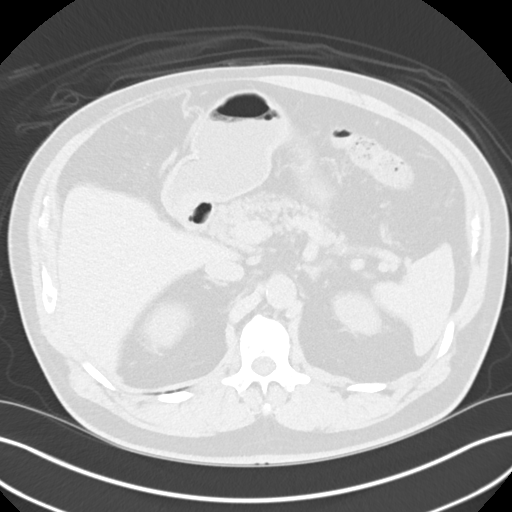
[im 85/97  soft-tissue]
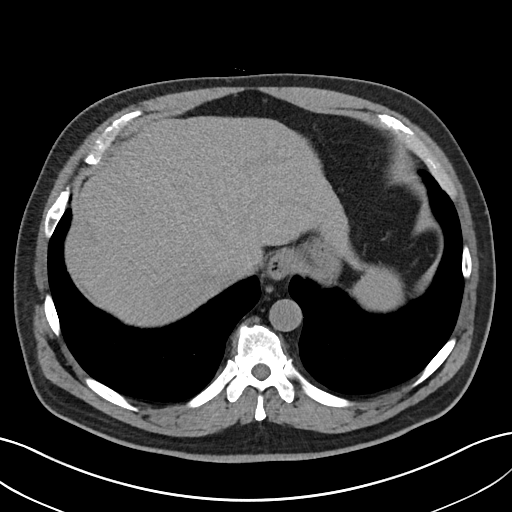
[im 85/97  lung]
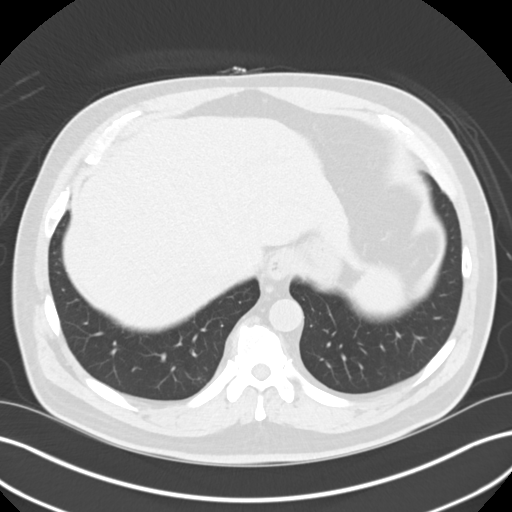

[Series 5: cor without without pre 2.00 cor · coronal · non-contrast · 0.76mm/px · 2 of 156 slices shown, 3 images]
[im 52/156  soft-tissue]
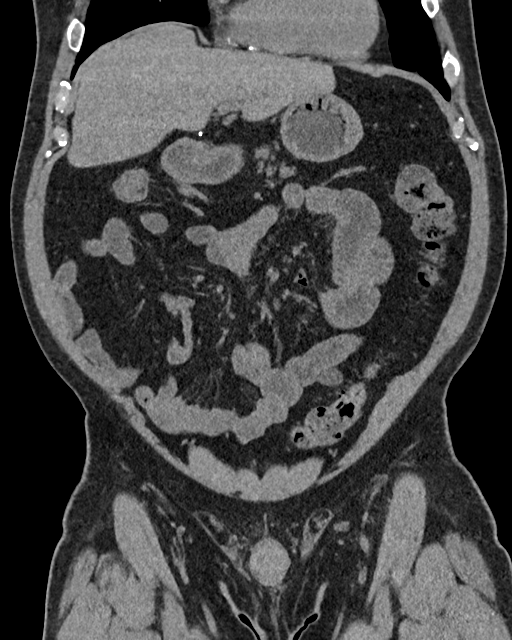
[im 52/156  bone]
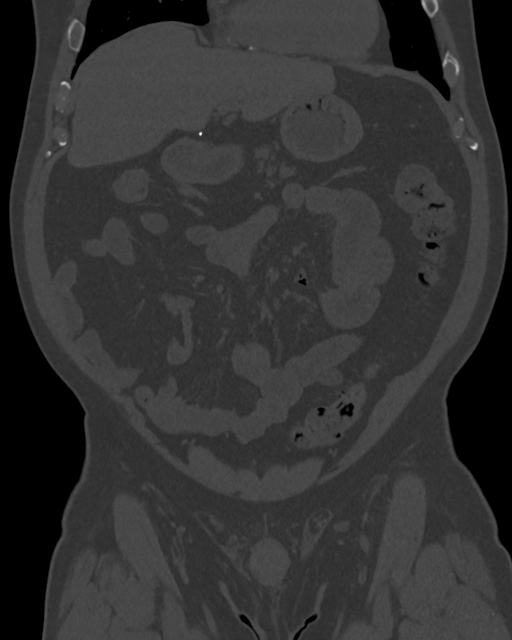
[im 104/156  soft-tissue]
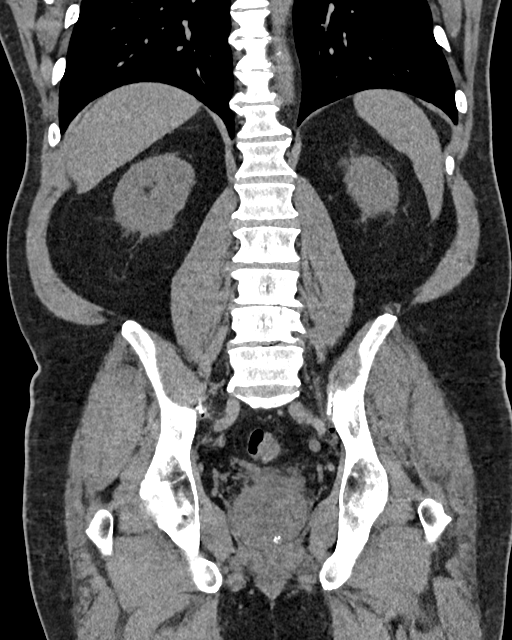

[9 of 46 positions shown; findings below may reference images not displayed]

RADIATION DOSE REDUCTION: This exam was performed according to the
departmental dose-optimization program which includes automated
exposure control, adjustment of the mA and/or kV according to
patient size and/or use of iterative reconstruction technique.

CONTRAST:  125mL OMNIPAQUE IOHEXOL 300 MG/ML  SOLN
FINDINGS: Lower chest: No acute abnormality.

Hepatobiliary: The suspicious hepatic lesion. Gallbladder surgically
absent. No biliary ductal dilation.

Pancreas: No pancreatic ductal dilation or evidence of acute
inflammation.

Spleen: No splenomegaly or focal splenic lesion.

Adrenals/Urinary Tract: Bilateral adrenal glands appear normal.

No hydronephrosis. Punctate 1-2 mm right upper/interpolar renal
calculus. No obstructive ureteral calculi. 5 mm calcification lying
in the dependent right side of the urinary bladder.

Bilateral subcentimeter hypodense renal lesions are technically too
small to accurately characterize but statistically likely to reflect
cysts, which in the absence of clinically indicated signs/symptoms
require no independent follow-up. No solid enhancing renal mass.

Kidneys demonstrate symmetric enhancement and excretion of contrast
material. No suspicious filling defect visualized within the
opacified portions of the collecting systems or ureters on delayed
imaging. The distal ureters are poorly opacified limiting
evaluation.

Trabecular wall thickening of the urinary bladder with effacement of
the urinary bladder by an enlarged prostate gland.

Stomach/Bowel: No radiopaque enteric contrast material was
administered. Stomach is unremarkable for degree of distension.
Duodenal diverticulum. No pathologic dilation of small or large
bowel. The appendix and terminal ileum appear normal. No evidence of
acute bowel inflammation.

Vascular/Lymphatic: Aortic and branch vessel atherosclerosis without
abdominal aortic aneurysm. No pathologically enlarged abdominal or
pelvic lymph nodes.

Reproductive: Prostatomegaly with a prostate gland size of 6.8 x
x 5.0 cm (volume = 100 cm^3)

Other: No significant abdominopelvic free fluid.

Musculoskeletal: Multilevel degenerative changes spine with grade 1
degenerative L4 on L5 anterolisthesis. Mild degenerative changes
bilateral hips and SI joints. Chronic changes of the pubic
symphysis.
IMPRESSION: 1. Punctate 1-2 mm right renal calculus. No obstructive ureteral
calculi.
2. 5 mm calcification lying in the dependent right side of the
urinary bladder.
3. Trabecular wall thickening of the urinary bladder with effacement
of the urinary bladder by an enlarged prostate gland, with a gland
volume of 100 cc, suggestive of chronic outlet obstruction.
4.  Aortic Atherosclerosis (KZAUN-RPH.H).

ADDENDUM:
Addendum regarding a dictation error in the body of the report
within the hepatobiliary section, which should state:

NO SUSPICIOUS HEPATIC LESION.

*** End of Addendum ***
RADIATION DOSE REDUCTION: This exam was performed according to the
departmental dose-optimization program which includes automated
exposure control, adjustment of the mA and/or kV according to
patient size and/or use of iterative reconstruction technique.

CONTRAST:  125mL OMNIPAQUE IOHEXOL 300 MG/ML  SOLN
FINDINGS: Lower chest: No acute abnormality.

Hepatobiliary: The suspicious hepatic lesion. Gallbladder surgically
absent. No biliary ductal dilation.

Pancreas: No pancreatic ductal dilation or evidence of acute
inflammation.

Spleen: No splenomegaly or focal splenic lesion.

Adrenals/Urinary Tract: Bilateral adrenal glands appear normal.

No hydronephrosis. Punctate 1-2 mm right upper/interpolar renal
calculus. No obstructive ureteral calculi. 5 mm calcification lying
in the dependent right side of the urinary bladder.

Bilateral subcentimeter hypodense renal lesions are technically too
small to accurately characterize but statistically likely to reflect
cysts, which in the absence of clinically indicated signs/symptoms
require no independent follow-up. No solid enhancing renal mass.

Kidneys demonstrate symmetric enhancement and excretion of contrast
material. No suspicious filling defect visualized within the
opacified portions of the collecting systems or ureters on delayed
imaging. The distal ureters are poorly opacified limiting
evaluation.

Trabecular wall thickening of the urinary bladder with effacement of
the urinary bladder by an enlarged prostate gland.

Stomach/Bowel: No radiopaque enteric contrast material was
administered. Stomach is unremarkable for degree of distension.
Duodenal diverticulum. No pathologic dilation of small or large
bowel. The appendix and terminal ileum appear normal. No evidence of
acute bowel inflammation.

Vascular/Lymphatic: Aortic and branch vessel atherosclerosis without
abdominal aortic aneurysm. No pathologically enlarged abdominal or
pelvic lymph nodes.

Reproductive: Prostatomegaly with a prostate gland size of 6.8 x
x 5.0 cm (volume = 100 cm^3)

Other: No significant abdominopelvic free fluid.

Musculoskeletal: Multilevel degenerative changes spine with grade 1
degenerative L4 on L5 anterolisthesis. Mild degenerative changes
bilateral hips and SI joints. Chronic changes of the pubic
symphysis.
IMPRESSION: 1. Punctate 1-2 mm right renal calculus. No obstructive ureteral
calculi.
2. 5 mm calcification lying in the dependent right side of the
urinary bladder.
3. Trabecular wall thickening of the urinary bladder with effacement
of the urinary bladder by an enlarged prostate gland, with a gland
volume of 100 cc, suggestive of chronic outlet obstruction.
4.  Aortic Atherosclerosis (KZAUN-RPH.H).

## 2023-08-11 DIAGNOSIS — H25041 Posterior subcapsular polar age-related cataract, right eye: Secondary | ICD-10-CM | POA: Diagnosis not present

## 2023-08-11 DIAGNOSIS — H2511 Age-related nuclear cataract, right eye: Secondary | ICD-10-CM | POA: Diagnosis not present

## 2023-08-11 DIAGNOSIS — Z961 Presence of intraocular lens: Secondary | ICD-10-CM | POA: Diagnosis not present

## 2023-08-11 DIAGNOSIS — H18413 Arcus senilis, bilateral: Secondary | ICD-10-CM | POA: Diagnosis not present

## 2023-08-19 DIAGNOSIS — H25011 Cortical age-related cataract, right eye: Secondary | ICD-10-CM | POA: Diagnosis not present

## 2023-08-19 DIAGNOSIS — H2511 Age-related nuclear cataract, right eye: Secondary | ICD-10-CM | POA: Diagnosis not present

## 2023-08-19 DIAGNOSIS — H25041 Posterior subcapsular polar age-related cataract, right eye: Secondary | ICD-10-CM | POA: Diagnosis not present

## 2023-08-23 DIAGNOSIS — G5603 Carpal tunnel syndrome, bilateral upper limbs: Secondary | ICD-10-CM | POA: Diagnosis not present

## 2023-08-23 DIAGNOSIS — E119 Type 2 diabetes mellitus without complications: Secondary | ICD-10-CM | POA: Diagnosis not present

## 2023-08-23 DIAGNOSIS — I10 Essential (primary) hypertension: Secondary | ICD-10-CM | POA: Diagnosis not present

## 2023-08-23 DIAGNOSIS — R310 Gross hematuria: Secondary | ICD-10-CM | POA: Diagnosis not present

## 2023-08-23 DIAGNOSIS — E11620 Type 2 diabetes mellitus with diabetic dermatitis: Secondary | ICD-10-CM | POA: Diagnosis not present

## 2023-08-23 DIAGNOSIS — E782 Mixed hyperlipidemia: Secondary | ICD-10-CM | POA: Diagnosis not present

## 2023-08-23 DIAGNOSIS — Z0000 Encounter for general adult medical examination without abnormal findings: Secondary | ICD-10-CM | POA: Diagnosis not present

## 2023-08-23 DIAGNOSIS — Z23 Encounter for immunization: Secondary | ICD-10-CM | POA: Diagnosis not present

## 2023-10-31 ENCOUNTER — Other Ambulatory Visit: Payer: Self-pay

## 2023-10-31 DIAGNOSIS — N200 Calculus of kidney: Secondary | ICD-10-CM

## 2023-10-31 DIAGNOSIS — Z87448 Personal history of other diseases of urinary system: Secondary | ICD-10-CM

## 2023-10-31 DIAGNOSIS — N138 Other obstructive and reflux uropathy: Secondary | ICD-10-CM

## 2023-11-01 ENCOUNTER — Other Ambulatory Visit
Admission: RE | Admit: 2023-11-01 | Discharge: 2023-11-01 | Disposition: A | Discharge status: Home / Self Care | Payer: Medicare Other | Attending: Urology | Admitting: Urology

## 2023-11-01 ENCOUNTER — Telehealth: Payer: Self-pay | Admitting: Urology

## 2023-11-01 ENCOUNTER — Ambulatory Visit (INDEPENDENT_AMBULATORY_CARE_PROVIDER_SITE_OTHER): Payer: Medicare Other | Admitting: Urology

## 2023-11-01 ENCOUNTER — Encounter: Payer: Self-pay | Admitting: Urology

## 2023-11-01 VITALS — BP 162/86 | HR 72 | Ht 65.0 in | Wt 212.0 lb

## 2023-11-01 DIAGNOSIS — N401 Enlarged prostate with lower urinary tract symptoms: Secondary | ICD-10-CM

## 2023-11-01 DIAGNOSIS — N138 Other obstructive and reflux uropathy: Secondary | ICD-10-CM | POA: Diagnosis present

## 2023-11-01 DIAGNOSIS — N200 Calculus of kidney: Secondary | ICD-10-CM | POA: Diagnosis present

## 2023-11-01 DIAGNOSIS — Z87448 Personal history of other diseases of urinary system: Secondary | ICD-10-CM | POA: Insufficient documentation

## 2023-11-01 DIAGNOSIS — Z87442 Personal history of urinary calculi: Secondary | ICD-10-CM | POA: Diagnosis not present

## 2023-11-01 LAB — URINALYSIS, COMPLETE (UACMP) WITH MICROSCOPIC
Bacteria, UA: NONE SEEN
Bilirubin Urine: NEGATIVE
Glucose, UA: NEGATIVE mg/dL
Ketones, ur: NEGATIVE mg/dL
Leukocytes,Ua: NEGATIVE
Nitrite: NEGATIVE
Specific Gravity, Urine: >1.03 — ABNORMAL HIGH (ref 1.005–1.030)
Squamous Epithelial / HPF: NONE SEEN /[HPF] (ref 0–5)
pH: 5.5 (ref 5.0–8.0)

## 2023-11-01 LAB — BLADDER SCAN AMB NON-IMAGING

## 2023-11-01 MED ORDER — FINASTERIDE 5 MG PO TABS: 5.0000 mg | ORAL_TABLET | Freq: Every day | ORAL | 3 refills | Status: DC

## 2023-11-01 MED ORDER — TAMSULOSIN HCL 0.4 MG PO CAPS: 0.4000 mg | ORAL_CAPSULE | Freq: Every day | ORAL | 11 refills | Status: DC

## 2023-11-01 NOTE — Progress Notes (Signed)
   11/01/2023 9:41 AM   Michael Leon 23-Nov-1950 [id number]  Reason for visit: Follow up BPH, history of hematuria, nephrolithiasis, PSA screening  HPI: 73 year old male we have followed for the above issues.  History of nephrolithiasis previously requiring shockwave lithotripsy and ureteroscopy.  History of UTI/sepsis after ureteroscopy.  He passed a few very small stones this year that was associated with an episode of gross hematuria and some dysuria but no significant flank pain.  Urinalysis today is benign with no microscopic hematuria.  He had an episode of painless gross hematuria in May 2023 and CT was benign aside from 100 g prostate, cystoscopy was benign, and he opted for trial of finasteride .  Has not had any asymptomatic gross hematuria since that time.  History of weak urinary stream and frequency, currently on max medical therapy with Flomax  and finasteride .  Prostate measures 100 g on prior CT prior to initiating finasteride .  No longer having problems with weak stream or postvoid dribbling.  Has some urgency, no urge incontinence, however likely diet related as he consumes a fair amount of diet sodas, coffee, and alcohol during the day and evening.  Behavioral strategies were discussed.  PVR today is normal at 30 mL and urinalysis benign.  PSA was normal at 1.0 from July 2023, and stable over the last 10 years, he opted to discontinue screening per the guideline recommendations.  We discussed general stone prevention strategies including adequate hydration with goal of producing 2.5 L of urine daily, increasing citric acid intake, increasing calcium  intake during high oxalate meals, minimizing animal protein, and decreasing salt intake. Information about dietary recommendations given today.   Continue Flomax  and finasteride , refilled RTC 1 year PVR   Michael JAYSON Burnet, MD  [facility] [address], [address] [place], [place] [address] [phone]

## 2023-11-01 NOTE — Telephone Encounter (Signed)
 Pt called for clarification regarding labs that were completed at today's visit. Please advise patient.

## 2023-11-04 NOTE — Telephone Encounter (Signed)
Called pt, he inquires about abnormal values he has seen via mychart on his urinalysis. Explained UA overall normal, provided reassurance. Pt voiced understanding.

## 2024-05-24 ENCOUNTER — Encounter: Payer: Self-pay | Admitting: Urology

## 2024-08-20 ENCOUNTER — Other Ambulatory Visit: Payer: Self-pay

## 2024-08-20 ENCOUNTER — Telehealth: Payer: Self-pay

## 2024-08-20 DIAGNOSIS — Z860100 Personal history of colon polyps, unspecified: Secondary | ICD-10-CM

## 2024-08-20 NOTE — Telephone Encounter (Signed)
 Gastroenterology Pre-Procedure Review  Request Date: 12/31/24 Requesting Physician: Dr. Jinny  PATIENT REVIEW QUESTIONS: The patient responded to the following health history questions as indicated:    1. Are you having any GI issues? no 2. Do you have a personal history of Polyps? yes (last colonoscopy performed by Dr. Jinny 12/31/19 recommended repeat in 5 years due to colon polyps) 3. Do you have a family history of Colon Cancer or Polyps? no 4. Diabetes Mellitus? no 5. Joint replacements in the past 12 months?no 6. Major health problems in the past 3 months?no 7. Any artificial heart valves, MVP, or defibrillator?no 8. Cardiac history? Arteriosclerosis of coronary artery, STEMI, CAD, HTN clearance sent to [facility]    MEDICATIONS & ALLERGIES:    Patient reports the following regarding taking any anticoagulation/antiplatelet therapy:   Plavix , Coumadin, Eliquis, Xarelto, Lovenox , Pradaxa, Brilinta, or Effient? no Aspirin ? 81 mg daily  Patient confirms/reports the following medications:  Current Outpatient Medications  Medication Sig Dispense Refill   aspirin  EC 81 MG tablet Take 81 mg by mouth in the morning. Swallow whole.     atorvastatin  (LIPITOR) 80 MG tablet Take 80 mg by mouth in the morning.     BESIVANCE 0.6 % SUSP Place 1 drop into the right eye 3 (three) times daily.     Bromfenac Sodium 0.07 % SOLN Place 1 drop into the right eye at bedtime.     Difluprednate 0.05 % EMUL Place 1 drop into the right eye 4 (four) times daily.     finasteride  (PROSCAR ) 5 MG tablet Take 1 tablet (5 mg total) by mouth daily. 90 tablet 3   losartan  (COZAAR ) 50 MG tablet Take 1 tablet (50 mg total) by mouth daily. 90 tablet 3   nitroGLYCERIN  (NITROSTAT ) 0.4 MG SL tablet Place 0.4 mg under the tongue every 5 (five) minutes as needed for chest pain.     pantoprazole  (PROTONIX ) 40 MG tablet Take 1 tablet (40 mg total) by mouth 2 (two) times daily. 180 tablet 0   PARoxetine  (PAXIL ) 40 MG tablet  TAKE 1 TABLET(40 MG) BY MOUTH DAILY 90 tablet 1   tamsulosin  (FLOMAX ) 0.4 MG CAPS capsule Take 1 capsule (0.4 mg total) by mouth daily. 30 capsule 11   No current facility-administered medications for this visit.    Patient confirms/reports the following allergies:  Allergies  Allergen Reactions   Hydrocodone  Other (See Comments)    Cold sweats    Codeine Other (See Comments)    Cold sweats (30 years ago) but has taken since with no reaction    No orders of the defined types were placed in this encounter.   AUTHORIZATION INFORMATION Primary Insurance: 1D#: Group #:  Secondary Insurance: 1D#: Group #:  SCHEDULE INFORMATION: Date: 12/31/24 Time: Location: [facility]

## 2024-08-21 ENCOUNTER — Telehealth: Payer: Self-pay

## 2024-08-21 NOTE — Telephone Encounter (Signed)
 Per fax received on 08/21/24 from [facility] Cardiologist Dr. Donnalee is cleared to have procedure  Thanks,  Rosaline, CMA

## 2024-10-30 ENCOUNTER — Ambulatory Visit: Admitting: Urology

## 2024-10-30 VITALS — BP 150/83 | HR 66 | Ht 64.0 in | Wt 189.1 lb

## 2024-10-30 DIAGNOSIS — N401 Enlarged prostate with lower urinary tract symptoms: Secondary | ICD-10-CM | POA: Diagnosis not present

## 2024-10-30 DIAGNOSIS — N200 Calculus of kidney: Secondary | ICD-10-CM

## 2024-10-30 DIAGNOSIS — N138 Other obstructive and reflux uropathy: Secondary | ICD-10-CM | POA: Diagnosis not present

## 2024-10-30 DIAGNOSIS — Z125 Encounter for screening for malignant neoplasm of prostate: Secondary | ICD-10-CM | POA: Diagnosis not present

## 2024-10-30 DIAGNOSIS — R310 Gross hematuria: Secondary | ICD-10-CM | POA: Diagnosis not present

## 2024-10-30 LAB — BLADDER SCAN AMB NON-IMAGING: Scan Result: 8

## 2024-10-30 MED ORDER — TAMSULOSIN HCL 0.4 MG PO CAPS: 0.4000 mg | ORAL_CAPSULE | Freq: Every day | ORAL | 11 refills | Status: AC

## 2024-10-30 MED ORDER — FINASTERIDE 5 MG PO TABS: 5.0000 mg | ORAL_TABLET | Freq: Every day | ORAL | 3 refills | Status: AC

## 2024-10-30 NOTE — Progress Notes (Signed)
" ° °  10/30/2024 9:45 AM   Michael Leon 1951/08/18 [id number]  Reason for visit: Follow up BPH, history of gross hematuria, nephrolithiasis, PSA screening  History: History of nephrolithiasis previously requiring shockwave lithotripsy and ureteroscopy, history of UTI/sepsis after ureteroscopy Painless gross hematuria May 2023 CT benign aside from 100 g prostate, cystoscopy benign.  Started on finasteride  and hematuria has resolved BPH/LUTS/weak stream/frequency, on max medical therapy with Flomax  and finasteride  with good results, PVRs have been normal PSAs have always been normal, <1  Physical Exam: BP (!) 150/83 (BP Location: Left Arm, Patient Position: Sitting, Cuff Size: Large)   Pulse 66   Ht 5' 4 (1.626 m)   Wt 189 lb 1.6 oz (85.8 kg)   SpO2 99%   BMI 32.46 kg/m   Imaging/labs: PSA December 2025 0.73, corrected for finasteride , 1.4, normal for age Urinalysis January 2025 benign  Today: Doing well Denies any stone events over the last year, no gross hematuria or flank pain No urinary complaints PVR today normal at 8ml Mild ED, not particular bothersome at this time, not interested in medications  Plan:   PSA screening: PSA has been normal, reviewed the guidelines that do not recommend routine screening in men over age 39 BPH: Continue maximal medical therapy with Flomax  and finasteride , refilled Gross hematuria: Negative workup in May 2023, felt to be related to BPH, resolved after starting finasteride  Nephrolithiasis: Stone prevention strategies briefly reviewed, no episodes in the last few years BPH medications refilled, RTC 1 year PVR   [place] JAYSON Burnet, MD  [facility] [address] [place], [place] [address] [phone]  "

## 2024-10-30 NOTE — Progress Notes (Signed)
 Michael Leon presents for an office/procedure visit. BP today is 150/83 . He is complaint with BP medication. Greater than 140/90. Provider  notified. Pt advised to f/u with PCP. Pt voiced understanding.

## 2024-10-31 ENCOUNTER — Ambulatory Visit: Payer: Self-pay | Admitting: Urology

## 2024-12-31 ENCOUNTER — Ambulatory Visit: Admit: 2024-12-31 | Admitting: Gastroenterology

## 2025-10-30 ENCOUNTER — Ambulatory Visit: Admitting: Urology
